# Patient Record
Sex: Male | Born: 1935
Health system: Southern US, Community
[De-identification: ages and names within clinical notes are randomized; demographics above are authoritative.]

## PROBLEM LIST (undated history)

## (undated) DIAGNOSIS — R0600 Dyspnea, unspecified: Secondary | ICD-10-CM

## (undated) DIAGNOSIS — Z87442 Personal history of urinary calculi: Secondary | ICD-10-CM

## (undated) DIAGNOSIS — I214 Non-ST elevation (NSTEMI) myocardial infarction: Secondary | ICD-10-CM

## (undated) DIAGNOSIS — H9190 Unspecified hearing loss, unspecified ear: Secondary | ICD-10-CM

## (undated) DIAGNOSIS — M1990 Unspecified osteoarthritis, unspecified site: Secondary | ICD-10-CM

## (undated) DIAGNOSIS — I219 Acute myocardial infarction, unspecified: Secondary | ICD-10-CM

## (undated) DIAGNOSIS — I2510 Atherosclerotic heart disease of native coronary artery without angina pectoris: Secondary | ICD-10-CM

## (undated) DIAGNOSIS — K219 Gastro-esophageal reflux disease without esophagitis: Secondary | ICD-10-CM

## (undated) DIAGNOSIS — C3490 Malignant neoplasm of unspecified part of unspecified bronchus or lung: Secondary | ICD-10-CM

## (undated) DIAGNOSIS — I10 Essential (primary) hypertension: Secondary | ICD-10-CM

## (undated) DIAGNOSIS — J449 Chronic obstructive pulmonary disease, unspecified: Secondary | ICD-10-CM

## (undated) DIAGNOSIS — N200 Calculus of kidney: Secondary | ICD-10-CM

## (undated) DIAGNOSIS — E785 Hyperlipidemia, unspecified: Secondary | ICD-10-CM

## (undated) DIAGNOSIS — I255 Ischemic cardiomyopathy: Secondary | ICD-10-CM

## (undated) HISTORY — PX: HERNIA REPAIR: SHX51

## (undated) HISTORY — PX: TONSILLECTOMY: SHX5217

## (undated) HISTORY — DX: Chronic obstructive pulmonary disease, unspecified: J44.9

## (undated) HISTORY — DX: Malignant neoplasm of unspecified part of unspecified bronchus or lung: C34.90

## (undated) HISTORY — PX: ANGIOPLASTY: SHX39

## (undated) HISTORY — PX: COLONOSCOPY: SHX174

## (undated) HISTORY — DX: Essential (primary) hypertension: I10

## (undated) HISTORY — DX: Unspecified osteoarthritis, unspecified site: M19.90

## (undated) HISTORY — DX: Atherosclerotic heart disease of native coronary artery without angina pectoris: I25.10

## (undated) HISTORY — DX: Gastro-esophageal reflux disease without esophagitis: K21.9

## (undated) HISTORY — PX: LIPOMA EXCISION: SHX5283

## (undated) HISTORY — DX: Non-ST elevation (NSTEMI) myocardial infarction: I21.4

## (undated) HISTORY — DX: Acute myocardial infarction, unspecified: I21.9

## (undated) HISTORY — DX: Hyperlipidemia, unspecified: E78.5

---
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
## 2006-03-08 ENCOUNTER — Inpatient Hospital Stay (HOSPITAL_COMMUNITY): Admission: EM | Admit: 2006-03-08 | Discharge: 2006-03-10 | Payer: Self-pay | Admitting: Internal Medicine

## 2006-03-08 ENCOUNTER — Encounter: Payer: Self-pay | Admitting: Emergency Medicine

## 2006-03-08 ENCOUNTER — Ambulatory Visit: Payer: Self-pay | Admitting: Internal Medicine

## 2006-03-21 ENCOUNTER — Ambulatory Visit: Payer: Self-pay | Admitting: Cardiovascular Disease

## 2006-04-14 ENCOUNTER — Encounter (HOSPITAL_COMMUNITY): Admission: RE | Admit: 2006-04-14 | Discharge: 2006-05-02 | Payer: Self-pay | Admitting: Cardiovascular Disease

## 2006-05-04 ENCOUNTER — Encounter (HOSPITAL_COMMUNITY): Admission: RE | Admit: 2006-05-04 | Discharge: 2006-06-03 | Payer: Self-pay | Admitting: Cardiovascular Disease

## 2006-06-13 ENCOUNTER — Ambulatory Visit: Payer: Self-pay | Admitting: Cardiovascular Disease

## 2006-06-29 ENCOUNTER — Ambulatory Visit: Payer: Self-pay | Admitting: Cardiovascular Disease

## 2006-07-06 ENCOUNTER — Encounter (HOSPITAL_COMMUNITY): Admission: RE | Admit: 2006-07-06 | Discharge: 2006-08-05 | Payer: Self-pay | Admitting: Cardiovascular Disease

## 2006-08-02 ENCOUNTER — Ambulatory Visit: Payer: Self-pay | Admitting: Cardiovascular Disease

## 2007-05-02 ENCOUNTER — Ambulatory Visit: Payer: Self-pay | Admitting: Cardiovascular Disease

## 2007-05-05 ENCOUNTER — Ambulatory Visit (HOSPITAL_COMMUNITY): Admission: RE | Admit: 2007-05-05 | Discharge: 2007-05-05 | Payer: Self-pay | Admitting: Cardiovascular Disease

## 2007-06-28 ENCOUNTER — Ambulatory Visit: Payer: Self-pay | Admitting: Cardiovascular Disease

## 2008-01-15 ENCOUNTER — Ambulatory Visit: Payer: Self-pay | Admitting: Cardiology

## 2008-02-24 ENCOUNTER — Ambulatory Visit: Payer: Self-pay | Admitting: *Deleted

## 2008-02-25 ENCOUNTER — Inpatient Hospital Stay (HOSPITAL_COMMUNITY): Admission: RE | Admit: 2008-02-25 | Discharge: 2008-02-27 | Payer: Self-pay | Admitting: Cardiology

## 2008-03-11 ENCOUNTER — Ambulatory Visit: Payer: Self-pay | Admitting: Cardiology

## 2008-07-26 ENCOUNTER — Ambulatory Visit: Payer: Self-pay | Admitting: Cardiology

## 2008-10-23 ENCOUNTER — Encounter (INDEPENDENT_AMBULATORY_CARE_PROVIDER_SITE_OTHER): Payer: Self-pay | Admitting: *Deleted

## 2008-12-10 ENCOUNTER — Telehealth (INDEPENDENT_AMBULATORY_CARE_PROVIDER_SITE_OTHER): Payer: Self-pay

## 2008-12-17 ENCOUNTER — Telehealth: Payer: Self-pay | Admitting: Cardiology

## 2008-12-18 ENCOUNTER — Telehealth (INDEPENDENT_AMBULATORY_CARE_PROVIDER_SITE_OTHER): Payer: Self-pay

## 2008-12-27 ENCOUNTER — Telehealth (INDEPENDENT_AMBULATORY_CARE_PROVIDER_SITE_OTHER): Payer: Self-pay | Admitting: *Deleted

## 2009-01-14 DIAGNOSIS — E782 Mixed hyperlipidemia: Secondary | ICD-10-CM

## 2009-01-14 DIAGNOSIS — J449 Chronic obstructive pulmonary disease, unspecified: Secondary | ICD-10-CM

## 2009-01-15 ENCOUNTER — Ambulatory Visit: Payer: Self-pay | Admitting: Cardiology

## 2009-01-15 DIAGNOSIS — I2510 Atherosclerotic heart disease of native coronary artery without angina pectoris: Secondary | ICD-10-CM

## 2009-01-15 DIAGNOSIS — I10 Essential (primary) hypertension: Secondary | ICD-10-CM | POA: Insufficient documentation

## 2009-01-21 ENCOUNTER — Encounter: Payer: Self-pay | Admitting: Cardiovascular Disease

## 2009-01-29 ENCOUNTER — Encounter: Payer: Self-pay | Admitting: Cardiology

## 2009-02-02 LAB — CONVERTED CEMR LAB
BUN: 22 mg/dL (ref 6–23)
Bilirubin, Direct: 0.1 mg/dL (ref 0.0–0.3)
CO2: 25 meq/L (ref 19–32)
Glucose, Bld: 92 mg/dL (ref 70–99)
LDL Cholesterol: 71 mg/dL (ref 0–99)
Potassium: 4.6 meq/L (ref 3.5–5.3)
Sodium: 144 meq/L (ref 135–145)
Total Bilirubin: 0.5 mg/dL (ref 0.3–1.2)
Total CHOL/HDL Ratio: 2.5
VLDL: 15 mg/dL (ref 0–40)

## 2009-02-03 ENCOUNTER — Encounter (INDEPENDENT_AMBULATORY_CARE_PROVIDER_SITE_OTHER): Payer: Self-pay | Admitting: *Deleted

## 2009-03-07 ENCOUNTER — Emergency Department (HOSPITAL_COMMUNITY): Admission: EM | Admit: 2009-03-07 | Discharge: 2009-03-07 | Payer: Self-pay | Admitting: Emergency Medicine

## 2009-03-09 ENCOUNTER — Emergency Department (HOSPITAL_COMMUNITY): Admission: EM | Admit: 2009-03-09 | Discharge: 2009-03-09 | Payer: Self-pay | Admitting: Emergency Medicine

## 2009-03-24 ENCOUNTER — Encounter: Payer: Self-pay | Admitting: Orthopedic Surgery

## 2009-03-26 ENCOUNTER — Telehealth (INDEPENDENT_AMBULATORY_CARE_PROVIDER_SITE_OTHER): Payer: Self-pay | Admitting: *Deleted

## 2009-03-29 ENCOUNTER — Emergency Department (HOSPITAL_COMMUNITY): Admission: EM | Admit: 2009-03-29 | Discharge: 2009-03-29 | Payer: Self-pay | Admitting: Emergency Medicine

## 2009-03-29 ENCOUNTER — Encounter: Payer: Self-pay | Admitting: Orthopedic Surgery

## 2009-03-31 ENCOUNTER — Ambulatory Visit: Payer: Self-pay | Admitting: Orthopedic Surgery

## 2009-04-03 ENCOUNTER — Telehealth (INDEPENDENT_AMBULATORY_CARE_PROVIDER_SITE_OTHER): Payer: Self-pay | Admitting: *Deleted

## 2009-04-07 ENCOUNTER — Ambulatory Visit: Payer: Self-pay | Admitting: Orthopedic Surgery

## 2009-04-14 ENCOUNTER — Ambulatory Visit: Payer: Self-pay | Admitting: Orthopedic Surgery

## 2009-05-14 ENCOUNTER — Ambulatory Visit: Payer: Self-pay | Admitting: Orthopedic Surgery

## 2009-05-22 ENCOUNTER — Ambulatory Visit: Payer: Self-pay | Admitting: Orthopedic Surgery

## 2009-06-12 ENCOUNTER — Ambulatory Visit: Payer: Self-pay | Admitting: Orthopedic Surgery

## 2009-07-24 ENCOUNTER — Ambulatory Visit: Payer: Self-pay | Admitting: Cardiology

## 2009-07-25 ENCOUNTER — Encounter: Payer: Self-pay | Admitting: Cardiology

## 2009-07-30 LAB — CONVERTED CEMR LAB
ALT: 11 units/L (ref 0–53)
Albumin: 4.3 g/dL (ref 3.5–5.2)
Cholesterol: 162 mg/dL (ref 0–200)
HDL: 55 mg/dL (ref >39)
Indirect Bilirubin: 0.2 mg/dL (ref 0.0–0.9)
Total CHOL/HDL Ratio: 2.9
Total Protein: 6.7 g/dL (ref 6.0–8.3)
Triglycerides: 88 mg/dL (ref <150)
VLDL: 18 mg/dL (ref 0–40)

## 2009-07-31 ENCOUNTER — Encounter: Payer: Self-pay | Admitting: Cardiology

## 2009-09-02 ENCOUNTER — Telehealth (INDEPENDENT_AMBULATORY_CARE_PROVIDER_SITE_OTHER): Payer: Self-pay

## 2010-01-22 ENCOUNTER — Ambulatory Visit: Payer: Self-pay | Admitting: Cardiology

## 2010-01-22 DIAGNOSIS — F17210 Nicotine dependence, cigarettes, uncomplicated: Secondary | ICD-10-CM

## 2010-02-03 ENCOUNTER — Encounter (HOSPITAL_COMMUNITY): Admission: RE | Admit: 2010-02-03 | Discharge: 2010-02-03 | Payer: Self-pay | Admitting: Cardiology

## 2010-02-03 ENCOUNTER — Ambulatory Visit: Payer: Self-pay | Admitting: Cardiology

## 2010-02-04 ENCOUNTER — Encounter: Payer: Self-pay | Admitting: Cardiology

## 2010-02-04 LAB — CONVERTED CEMR LAB
Alkaline Phosphatase: 48 units/L (ref 39–117)
Cholesterol: 142 mg/dL (ref 0–200)
Indirect Bilirubin: 0.3 mg/dL (ref 0.0–0.9)
LDL Cholesterol: 75 mg/dL (ref 0–99)
Total Protein: 6.6 g/dL (ref 6.0–8.3)
Triglycerides: 81 mg/dL (ref <150)

## 2010-03-17 ENCOUNTER — Emergency Department (HOSPITAL_COMMUNITY): Admission: EM | Admit: 2010-03-17 | Discharge: 2010-03-17 | Payer: Self-pay | Admitting: Internal Medicine

## 2010-05-08 ENCOUNTER — Telehealth (INDEPENDENT_AMBULATORY_CARE_PROVIDER_SITE_OTHER): Payer: Self-pay

## 2010-05-11 ENCOUNTER — Telehealth (INDEPENDENT_AMBULATORY_CARE_PROVIDER_SITE_OTHER): Payer: Self-pay

## 2010-05-11 ENCOUNTER — Encounter: Payer: Self-pay | Admitting: Cardiology

## 2010-05-24 ENCOUNTER — Encounter: Payer: Self-pay | Admitting: Cardiovascular Disease

## 2010-06-02 NOTE — Letter (Signed)
Summary: [place] Results Engineer, agricultural at [facility]  [address], [place] [address]   Phone: [phone]  Fax: [phone]      July 31, 2009 MRN: [id number]   [name] Graham [address] [place], [place]  [address]   Dear Mr. BOGUE,  Your test ordered by Selena Batten has been reviewed by your physician (or physician assistant) and was found to be normal or stable. Your physician (or physician assistant) felt no changes were needed at this time.  ____ Echocardiogram  ____ Cardiac Stress Test  __X__ Lab Work  ____ Peripheral vascular study of arms, legs or neck  ____ CT scan or X-ray  ____ Lung or Breathing test  ____ Other: Please continue on current medical treatment.   Thank you.   Nona Dell, MD, F.A.C.C

## 2010-06-02 NOTE — Letter (Signed)
Summary: [place] Results Engineer, agricultural at [facility]  [address], [place] [address]   Phone: [phone]  Fax: [phone]      February 04, 2010 MRN: [id number]   CEPHUS TUPY [address] [place], [place]  [address]   Dear Mr. LANGENBACH,  Your test ordered by Selena Batten has been reviewed by your physician (or physician assistant) and was found to be normal or stable. Your physician (or physician assistant) felt no changes were needed at this time.  ____ Echocardiogram  __X__ Cardiac Stress Test  __X__ Lab Work  ____ Peripheral vascular study of arms, legs or neck  ____ CT scan or X-ray  ____ Lung or Breathing test  ____ Other:  Please continue on current medical treatment.   Thank you.   Nona Dell, MD, F.A.C.C

## 2010-06-02 NOTE — Letter (Signed)
Summary: [place] (Nuc Med Stress)  [facility]  [address], [place] [address]   Phone: [phone]  Fax: [phone]    Nuclear Medicine 1-Day Stress Test Information Sheet  Re:     [name] Graham   DOB:     03-30-1936 MRN:     [id number] Weight:  Appointment Date: Register at: Appointment Time: Referring MD:  ___Exercise Stress  __Adenosine   __Dobutamine  _X_Lexiscan  __Persantine   __Thallium  Urgency: ____1 (next day)   ____2 (one week)    ____3 (PRN)  Patient will receive Follow Up call with results: Patient needs follow-up appointment:  Instructions regarding medication:  How to prepare for your stress test: 1. DO NOT eat or dring 6 hours prior to your arrival time. This includes no caffeine (coffee, tea, sodas, chocolate) if you were instructed to take your medications, drink water with it. 2. DO NOT use any tobacco products for at leaset 8 hours prior to arrival. 3. DO NOT wear dresses or any clothing that may have metal clasps or buttons. 4. Wear short sleeve shirts, loose clothing, and comfortalbe walking shoes. 5. DO NOT use lotions, oils or powder on your chest before the test. 6. The test will take approximately 3-4 hours from the time you arrive until completion. 7. To register the day of the test, go to the Short Stay entrance at [facility]. 8. If you must cancel your test, call [phone] as soon as you are aware. 9. Please take your medicines as you normally do on morning of test.  After you arrive for test:   When you arrive at [facility], you will go to Short Stay to be registered. They will then send you to Radiology to check in. The Nuclear Medicine Tech will get you and start an IV in your arm or hand. A small amount of a radioactive tracer will then be injected into your IV. This tracer will then have to circulate for 30-45 minutes. During this time you will wait in the waiting room and you will be able  to drink something without caffeine. A series of pictures will be taken of your heart follwoing this waiting period. After the 1st set of pictures you will go to the stress lab to get ready for your stress test. During the stress test, another small amount of a radioactive tracer will be injected through your IV. When the stress test is complete, there is a short rest period while your heart rate and blood pressure will be monitored. When this monitoring period is complete you will have another set of pictrues taken. (The same as the 1st set of pictures). These pictures are taken between 15 minutes and 1 hour after the stress test. The time depends on the type of stress test you had. Your doctor will inform you of your test results within 7 days after test.    The possibilities of certain changes are possible during the test. They include abnormal blood pressure and disorders of the heart. Side effects of persantine or adenosine can include flushing, chest pain, shortness of breath, stomach tightness, headache and light-headedness. These side effects usually do not last long and are self-resolving. Every effort will be made to keep you comfortable and to minimize complications by obtaining a medical history and by close observation during the test. Emergency equipment, medications, and trained personnel are available to deal with any unusual situation which may arise.  Please notify  office at least 48 hours in advance if you are unable to keep this appt.

## 2010-06-02 NOTE — Assessment & Plan Note (Signed)
Summary: FINGER SWOLLEN/MEDICARE/BSF   Visit Type:  Follow-up Primary Provider:  Dr. Rudi Heap  CC:  left index finger.  History of Present Illness: I saw [name] Graham in the office today for a followup visit.  He is a 75 years old man with the complaint of:  LEFT INDEX FINGER.  Patient states that Monday night his finger started swelling and stiffness. He states that he had cut wood all day and he thinks he  overworked it.  The finger looks good now   no tenderness, minimal amount of swelling   I think its synovitis non infectious   Use ice if it doesnt go down or he loses motion then return     Allergies: No Known Drug Allergies   Impression & Recommendations:  Problem # 1:  OTHER TENOSYNOVITIS OF HAND AND WRIST (ICD-727.05) Assessment Comment Only  Orders: Est. Patient Level II ([address])  Patient Instructions: 1)  Please schedule a follow-up appointment as needed.

## 2010-06-02 NOTE — Assessment & Plan Note (Signed)
Summary: B1Y   Visit Type:  Follow-up Primary Neena Beecham:  Dr. Rudi Heap   History of Present Illness: 75 year old male presents for followup. I saw him back in March of this year. He reports doing well without significant angina or unusual shortness of breath. 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  He is nearly 2 years out from his most recent percutaneous intervention with DES to the RCA. We discussed followup ischemic testing.  Followup labs from March showed cholesterol 162, triglycerides 88, HDL 55, LDL 89, AST 15, ALT 11. He has not had recent reassessment of lipids.  Reports tolerating medications. He states that he did not have to undergo any dental work, and has had uninterrupted dual antiplatelet therapy. No reported bleeding problems.  Current Medications (verified): 1)  Metoprolol Tartrate 25 Mg Tabs (Metoprolol Tartrate) .... Take 1 Tab Daily 2)  Lisinopril 40 Mg Tabs (Lisinopril) .... Take 1 Tablet By Mouth Once Daily 3)  Nitroglycerin 0.4 Mg Subl (Nitroglycerin) .... Place 1 Tablet Under Tongue As Directed 4)  Simvastatin 80 Mg Tabs (Simvastatin) .... Take 1 Tablet By Mouth Every Night 5)  Aspir-Low 81 Mg Tbec (Aspirin) .... Take 1 Tab Daily 6)  Plavix 75 Mg Tabs (Clopidogrel Bisulfate) .... Take 1 Tab Daily  Allergies (verified): No Known Drug Allergies  Past History:  Past Medical History: Last updated: 01/15/2009 CAD - BMS circ and PTCA PDA 2007, DES RCA 10/09 C O P D G E R D Hyperlipidemia Hypertension Myocardial Infarction  Social History: Last updated: 01/14/2009 Retired  Divorced  Tobacco Use - Yes.  Alcohol Use - no Regular Exercise - no Drug Use - no   Review of Systems  The patient denies anorexia, fever, chest pain, syncope, dyspnea on exertion, peripheral edema, melena, and hematochezia.         Otherwise reviewed and negative except as outlined.  Vital Signs:  Patient profile:   75 year old male Weight:       164 pounds BMI:     23.62 Pulse rate:   63 / minute BP sitting:   147 / 73  (right arm)  Vitals Entered By: Dreama Saa, CNA (January 22, 2010 2:27 PM)  Physical Exam  Additional Exam:  Normally nourished appearing male in no acute distress. HEENT: Conjunctiva and lids normal, oropharynx with moist mucosa, poor dentition. Neck: Supple, no carotid bruits or elevated JVP. Lungs: Clear with diminished breath sounds, nonlabored. Cardiac: Regular rate and rhythm, no significant systolic murmur or S3. Abdomen: Soft, nontender, bowel sounds present. Skin: Warm and dry. Extremities: No pitting edema, distal pulses 2+. Musculoskeletal: No kyphosis. Neuropsychiatric: Alert and oriented x3, affect appropriate.   Cardiac Cath  Procedure date:  02/26/2008  Findings:      Left mainstem:  Widely patent.  Bifurcates into LAD and left circumflex.      LAD is a moderate-sized vessel and courses down and reaches the LV apex.   There is a hazy stenosis in the proximal vessel of the origin in the   first diagonal.  The first diagonal is widely patent.  Intravascular   ultrasound was performed with findings as detailed above.  There was   nonobstructive disease with a minimal lumen area just over 4 square   millimeters.  There was mild concentric calcified plaque.  Remaining   portion of the vessel had no significant angiographic stenosis.      Left circumflex.  Left circumflex was a large vessel.  There was a   widely patent stent in the proximal vessel.  There was no restenosis   present.  The distal vessel had mild diffuse disease that appeared   nonobstructive and unchanged from a prior catheterization in 2007.      Right coronary artery is tortuous.  It is a large vessel that courses   down and supplies a PDA branch and posterolateral branch.  There were   tandem mid lesions of 80% at the junction of the proximal and mid vessel   and then at the junction of the mid to distal vessel.   The distal right   coronary artery bifurcation that was previously treated with balloon   angioplasty appears widely patent with no significant stenosis.  The PDA   and posterolateral branches are small to moderate-sized vessels.  There   was TIMI III flow in the vessel.      Left ventriculography demonstrated normal LV function with an LVEF of   55%.  There is no significant mitral regurgitation.   EKG  Procedure date:  01/22/2010  Findings:      Sinus rhythm at 60 beats per minute with decreased septal R waves.  Impression & Recommendations:  Problem # 1:  CORONARY ATHEROSCLEROSIS NATIVE CORONARY ARTERY (ICD-414.01)  At this point symptomatically stable on medical therapy. He is just at 2 years out from prior intervention with DES to the RCA. Plan followup Lexiscan Myoview on medical therapy for ischemic surveillance. Otherwise clinical visit in 6 months.  His updated medication list for this problem includes:    Metoprolol Tartrate 25 Mg Tabs (Metoprolol tartrate) .Marland Kitchen... Take 1 tab daily    Lisinopril 40 Mg Tabs (Lisinopril) .Marland Kitchen... Take 1 tablet by mouth once daily    Nitroglycerin 0.4 Mg Subl (Nitroglycerin) .Marland Kitchen... Place 1 tablet under tongue as directed    Aspir-low 81 Mg Tbec (Aspirin) .Marland Kitchen... Take 1 tab daily    Plavix 75 Mg Tabs (Clopidogrel bisulfate) .Marland Kitchen... Take 1 tab daily  His updated medication list for this problem includes:    Metoprolol Tartrate 25 Mg Tabs (Metoprolol tartrate) .Marland Kitchen... Take 1 tab daily    Lisinopril 40 Mg Tabs (Lisinopril) .Marland Kitchen... Take 1 tablet by mouth once daily    Nitroglycerin 0.4 Mg Subl (Nitroglycerin) .Marland Kitchen... Place 1 tablet under tongue as directed    Aspir-low 81 Mg Tbec (Aspirin) .Marland Kitchen... Take 1 tab daily    Plavix 75 Mg Tabs (Clopidogrel bisulfate) .Marland Kitchen... Take 1 tab daily  Problem # 2:  HYPERLIPIDEMIA (ICD-272.4)  Due for followup fasting lipid profile and liver function tests. He has tolerated high-dose simvastatin for some time now.  His updated  medication list for this problem includes:    Simvastatin 80 Mg Tabs (Simvastatin) .Marland Kitchen... Take 1 tablet by mouth every night  His updated medication list for this problem includes:    Simvastatin 80 Mg Tabs (Simvastatin) .Marland Kitchen... Take 1 tablet by mouth every night  Orders: T-Lipid Profile ([address]) T-Hepatic Function [phone])  Problem # 3:  ESSENTIAL HYPERTENSION, BENIGN (ICD-401.1)  Blood pressure up some today. We discussed sodium restriction and exercise.  His updated medication list for this problem includes:    Metoprolol Tartrate 25 Mg Tabs (Metoprolol tartrate) .Marland Kitchen... Take 1 tab daily    Lisinopril 40 Mg Tabs (Lisinopril) .Marland Kitchen... Take 1 tablet by mouth once daily    Aspir-low 81 Mg Tbec (Aspirin) .Marland Kitchen... Take 1 tab daily  His updated medication list for this problem includes:    Metoprolol Tartrate 25  Mg Tabs (Metoprolol tartrate) .Marland Kitchen... Take 1 tab daily    Lisinopril 40 Mg Tabs (Lisinopril) .Marland Kitchen... Take 1 tablet by mouth once daily    Aspir-low 81 Mg Tbec (Aspirin) .Marland Kitchen... Take 1 tab daily  Problem # 4:  TOBACCO ABUSE (ICD-305.1)  Smoking cessation has been discussed. He has not been able to quit.  Other Orders: Nuclear Stress Test (Nuc Stress Test)  Patient Instructions: 1)  Your physician recommends that you schedule a follow-up appointment in:  6 months 2)  Your physician recommends that you return for lab work in: This week 3)  Your physician recommends that you continue on your current medications as directed. Please refer to the Current Medication list given to you today. 4)  Your physician has requested that you have an [employer]  For further information please visit [email or web address]  Please follow instruction sheet, as given.

## 2010-06-02 NOTE — Assessment & Plan Note (Signed)
Summary: 1 M RE-CK FINGER LT HAND/CIGNA MEDICARE/CAF   Visit Type:  Follow-up Primary Provider:  Dr. Rudi Heap  CC:  swelling LEFT index finger.  History of Present Illness: I saw [name] Graham in the office today for a followup visit.  He is a 75 years old man with the complaint of:  swelling of the LEFT index finger.  History the patient a puncture wound near the proximal phalanx of the LEFT index finger onDOI: 03/08/09 this was treated with clindamycin, and observation.  Complaints: his finger was alomost back to normal for a couple weeks and then 05/11/09 woke up and finger was swollen again, with difficult ROM.  Has alot of soreness joint of the left hand near palm.  Has a piece of steel in the finger for over 10 years    Review of systems no saline is no streaking, lymphangitis. No swelling of the lymph nodes.  Examination of the LEFT index finger shows that the entire finger was swollen, but is tender from the proximal phalanx IP joint2. The A1 pulley area of the palm. His decreased flexion. He is painful extension. The thenar eminence is nontender. No swelling. Joint. Range of motion is normal.  Assessment I think he has a trigger finger with flexor tenosynovitis.  Recommend injection.  Return one week    Current Medications (verified): 1)  Metoprolol Tartrate 25 Mg Tabs (Metoprolol Tartrate) .... Take 1 Tab Daily 2)  Lisinopril 40 Mg Tabs (Lisinopril) .... Take 1 Tablet By Mouth Once Daily 3)  Nitroglycerin 0.4 Mg Subl (Nitroglycerin) .... Place 1 Tablet Under Tongue As Directed 4)  Simvastatin 80 Mg Tabs (Simvastatin) .... Take 1 Tablet By Mouth Every Night 5)  Aspir-Low 81 Mg Tbec (Aspirin) .... Take 1 Tab Daily 6)  Plavix 75 Mg Tabs (Clopidogrel Bisulfate) .... Take 1 Tab Daily  Allergies (verified): No Known Drug Allergies   Impression & Recommendations:  Problem # 1:  OTHER TENOSYNOVITIS OF HAND AND WRIST (ICD-727.05) Assessment  Deteriorated  Orders: Est. Patient Level III ([address]) Depo- Medrol 40mg  (J1030) Joint Aspirate / Injection, Small ([address])  Verbal consent was obtained: The finger was prepped with ethyl chloride and injected with 1:1 injection of .25% sensorcaine, 1cc  and 40 mg of depomedrol, 1cc. There were no complications.  Patient Instructions: 1)  Please schedule a follow-up appointment in 1 week. 2)  You have received an injection of cortisone today. You may experience increased pain at the injection site. Apply ice pack to the area for 20 minutes every 2 hours and take 2 xtra strength tylenol every 8 hours. This increased pain will usually resolve in 24 hours. The injection will take effect in 3-10 days.

## 2010-06-02 NOTE — Assessment & Plan Note (Signed)
Summary: 1 WK RE-CK FINGER LT HAND/CIGNA MEDICARE/CAF   Visit Type:  Follow-up Primary Provider:  Dr. Rudi Heap  CC:  LEFT index finger swelling stiffness.  History of Present Illness: I saw [name] Graham in the office today for a followup visit.  He is a 75 years old man with the complaint of:  DX: swelling left index finger.  Puncture wound 03/08/09.  Treatment:  ATBS, soaks.  MEDS:  None.  Complaints:  the injection helped, he can make a fist, he still has stiffness in the am.  Today, scheduled for: one week recheck on the left index finger after injection.  His finger looks good he can make a full fist he has no tenderness or swelling.  Impression improved after injection for triggering.  Plan return as needed    Allergies: No Known Drug Allergies   Impression & Recommendations:  Problem # 1:  OTHER TENOSYNOVITIS OF HAND AND WRIST (ICD-727.05) Assessment Improved  Orders: Est. Patient Level II ([address])  Patient Instructions: 1)  come back as needed

## 2010-06-02 NOTE — Progress Notes (Signed)
Summary: RX REFILL PT HAS BEEN OUT FOR 7 DAYS  Phone Note Call from Patient Call back at Home Phone [phone]   Caller: PT Reason for Call: Refill Medication Summary of Call: S: PT NEEDS LISINOPRIL 20MG  #30 [facility]ED INTO [facility] [place], HE SAID HE CALLED IT INTO PHARMACY AND THEY TOLD HIM THEY ARE WAITING FOR AUTHERIZATION FROM [place]. Initial call taken by: Faythe Ghee,  Sep 02, 2009 12:27 PM    Prescriptions: LISINOPRIL 40 MG TABS (LISINOPRIL) take 1 tablet by mouth once daily  #30 x 4   Entered by:   Larita Fife Via LPN   Authorized by:   Loreli Slot, MD, [facility]   Signed by:   Larita Fife Via LPN on 28/41/3244   Method used:   [facility]ronically to        [facility] [phone]* (retail)       [address]       [place], [place]  [address]       Ph: [phone] or [phone]       Fax: [phone]   RxID:   [id number]

## 2010-06-02 NOTE — Assessment & Plan Note (Signed)
Summary: 6 MTH F/U PER CHECKOUT ON 01/15/09/TG   Visit Type:  Follow-up Primary Provider:  Dr. Rudi Heap   History of Present Illness: 75 year old male presents for a followup visit. He continues to work in his [employer], staying busy this time of year. He is reporting no significant angina or limiting breathlessness, and is tolerating his medicines well. He has had no bleeding problems on aspirin and Plavix.  Followup labs from September 2010 revealed sodium 144, potassium 4.6, BUN 22, creatinine 0.8, total cholesterol 143, [address] 73, HDL 57, LDL 71, AST 15, ALT 12.  He is due for a followup lipid profile and liver function tests.  He mentioned that he is in need of having some teeth extracted. He is at a point now, greater than one year out from drug-eluting stent placement, and could consider temporarily discontinuing Plavix if needed. I discussed this him today.   Current Medications (verified): 1)  Metoprolol Tartrate 25 Mg Tabs (Metoprolol Tartrate) .... Take 1 Tab Daily 2)  Lisinopril 40 Mg Tabs (Lisinopril) .... Take 1 Tablet By Mouth Once Daily 3)  Nitroglycerin 0.4 Mg Subl (Nitroglycerin) .... Place 1 Tablet Under Tongue As Directed 4)  Simvastatin 80 Mg Tabs (Simvastatin) .... Take 1 Tablet By Mouth Every Night 5)  Aspir-Low 81 Mg Tbec (Aspirin) .... Take 1 Tab Daily 6)  Plavix 75 Mg Tabs (Clopidogrel Bisulfate) .... Take 1 Tab Daily  Allergies (verified): No Known Drug Allergies  Past History:  Past Medical History: Last updated: 01/15/2009 CAD - BMS circ and PTCA PDA 2007, DES RCA 10/09 C O P D G E R D Hyperlipidemia Hypertension Myocardial Infarction  Social History: Last updated: 01/14/2009 Retired  Divorced  Tobacco Use - Yes.  Alcohol Use - no Regular Exercise - no Drug Use - no  Review of Systems  The patient denies anorexia, fever, weight loss, chest pain, syncope, dyspnea on exertion, peripheral edema, hemoptysis, melena,  hematochezia, and severe indigestion/heartburn.         He reports having an infection in his left hand after injuring it, now healed. Otherwise reviewed and negative except as outlined above.  Vital Signs:  Patient profile:   75 year old male Weight:      160 pounds Pulse rate:   60 / minute BP sitting:   139 / 85  (right arm)  Vitals Entered By: Dreama Saa, CNA (July 24, 2009 1:37 PM)  Physical Exam  Additional Exam:  Normally nourished appearing male in no acute distress. HEENT: Conjunctiva and lids normal, oropharynx with moist mucosa, poor dentition. Neck: Supple, no carotid bruits or elevated JVP. Lungs: Clear with diminished breath sounds, nonlabored. Cardiac: Regular rate and rhythm, no significant systolic murmur or S3. Abdomen: Soft, nontender, bowel sounds present. Skin: Warm and dry. Extremities: No pitting edema, distal pulses 2+. Musculoskeletal: No kyphosis. Neuropsychiatric: Alert and oriented x3, affect appropriate.   EKG  Procedure date:  07/24/2009  Findings:      Sinus bradycardia at 58 beats per minute.  Impression & Recommendations:  Problem # 1:  CORONARY ATHEROSCLEROSIS NATIVE CORONARY ARTERY (ICD-414.01)  Clinically stable on medical therapy with no active angina or unusual breathlessness. Electrocardiogram is also stable. No medication changes were made today. I will see him back in 6 months.  His updated medication list for this problem includes:    Metoprolol Tartrate 25 Mg Tabs (Metoprolol tartrate) .Marland Kitchen... Take 1 tab daily    Lisinopril 40 Mg Tabs (Lisinopril) .Marland Kitchen... Take 1  tablet by mouth once daily    Nitroglycerin 0.4 Mg Subl (Nitroglycerin) .Marland Kitchen... Place 1 tablet under tongue as directed    Aspir-low 81 Mg Tbec (Aspirin) .Marland Kitchen... Take 1 tab daily    Plavix 75 Mg Tabs (Clopidogrel bisulfate) .Marland Kitchen... Take 1 tab daily  Problem # 2:  ESSENTIAL HYPERTENSION, BENIGN (ICD-401.1)  Blood pressure coming under better control. He is tolerating the  increase in lisinopril, and had stable renal function.  His updated medication list for this problem includes:    Metoprolol Tartrate 25 Mg Tabs (Metoprolol tartrate) .Marland Kitchen... Take 1 tab daily    Lisinopril 40 Mg Tabs (Lisinopril) .Marland Kitchen... Take 1 tablet by mouth once daily    Aspir-low 81 Mg Tbec (Aspirin) .Marland Kitchen... Take 1 tab daily  Problem # 3:  HYPERLIPIDEMIA (ICD-272.4)  Due for followup fasting lipid profile and liver function tests.  His updated medication list for this problem includes:    Simvastatin 80 Mg Tabs (Simvastatin) .Marland Kitchen... Take 1 tablet by mouth every night  Future Orders: T-Lipid Profile ([id number]) ... 07/25/2009 T-Hepatic Function [phone]) ... 07/25/2009  Patient Instructions: 1)  Your physician recommends that you schedule a follow-up appointment in: 6 months 2)  Your physician recommends that you return for lab work in: Ths week 3)  Your physician recommends that you continue on your current medications as directed. Please refer to the Current Medication list given to you today.

## 2010-06-04 NOTE — Progress Notes (Signed)
**Note De-Identified [name] Graham Obfuscation** Summary: Letter to okay dental procedure   Phone Note Call from Patient   Caller: Patient Reason for Call: Talk to Nurse Summary of Call: patient states that he needs letter stating that it is okay to have oral procedure / please call when ready /tg Initial call taken by: Raechel Ache [facility],  May 11, 2010 8:16 AM  Follow-up for Phone Call        Clearence letter faxed to [facility] at [phone] for planned dental procedures. Follow-up by: Larita Fife Leylani Duley LPN,  May 11, 2010 9:53 AM

## 2010-06-04 NOTE — Progress Notes (Signed)
**Note De-Identified Isrrael Fluckiger Obfuscation** Summary: Medication Questions  Phone Note Call from Patient   Caller: Patient Reason for Call: Talk to Nurse Summary of Call: patient has questions regarding stopping medications prior to dental work / tg Initial call taken by: Raechel Ache [facility],  May 08, 2010 8:50 AM  Follow-up for Phone Call        S: Pt. is having dental work done.  B: On 07-24-09 appt. with Dr. Diona Browner pt. states he was advised to stop taking Plavix and ASA 5 days prior to dental work. Pt. was later told he did not have to have any dental work at that time. A: Pt. states that he is having dental work done soon and wants to know when he should stop taking Plavix and ASA.  R: We will return call with Dr. Ival Bible recommendations. Follow-up by: Larita Fife Clotilde Loth LPN,  May 08, 2010 1:32 PM  Additional Follow-up for Phone Call Additional follow up Details #1::        I would ask that he check with his dentist to see if they have a typical duration for interrupting antiplatelet therapy with which they are comfortable prior to oral procedures. Generally this ranges from 5-7 days, and either should be generally OK from a cardiac perspective. He is greater than 2 years out from DES. Should resume both after oral procedure when safe. Additional Follow-up by: Loreli Slot, MD, [facility],  May 08, 2010 2:04 PM    Additional Follow-up for Phone Call Additional follow up Details #2::    Pt. advised. He states he will call his dentist today to find out when he should stop ASA and Plavix. Follow-up by: Larita Fife Apolonio Cutting LPN,  May 08, 2010 2:18 PM

## 2010-06-04 NOTE — Letter (Signed)
Summary: Clearance Letter  [facility]  [address] [place] [address]   Phone: [phone]  Fax: [phone]    May 11, 2010  Re:     AGUSTINE ROSSITTO Address:   [address]     [place], [place]  [address] DOB:     [birth year] MRN:     [id number]   To Whom This May Concern,    Mr. Maki Sweetser has been cleared for oral procedures from a cardiac stand   point.  The patient states he has been holding Plavix and Aspirin since Wednesday May 06, 2010, we recommend that these procedures be performed within the next 2 days.  Generally  patient's on antiplatelet therapy should only hold these medications for 5-7 days prior to procedure   and resume both after procedure when safe. He is greater than 2 years out from DES.  I would   expect that he should be able to proceed with planned dental procedures.          Sincerely,  Dr. Nona Dell, MD, [facility]

## 2010-06-12 ENCOUNTER — Encounter (INDEPENDENT_AMBULATORY_CARE_PROVIDER_SITE_OTHER): Payer: Self-pay | Admitting: *Deleted

## 2010-06-12 ENCOUNTER — Telehealth (INDEPENDENT_AMBULATORY_CARE_PROVIDER_SITE_OTHER): Payer: Self-pay

## 2010-06-12 ENCOUNTER — Other Ambulatory Visit: Payer: Self-pay | Admitting: Cardiology

## 2010-06-12 ENCOUNTER — Ambulatory Visit (INDEPENDENT_AMBULATORY_CARE_PROVIDER_SITE_OTHER): Payer: Medicare Other | Admitting: Adult Health

## 2010-06-12 ENCOUNTER — Encounter: Payer: Self-pay | Admitting: Adult Health

## 2010-06-12 ENCOUNTER — Ambulatory Visit (HOSPITAL_COMMUNITY)
Admission: RE | Admit: 2010-06-12 | Discharge: 2010-06-12 | Disposition: A | Discharge status: Home / Self Care | Payer: Medicare Other | Source: Ambulatory Visit | Attending: Cardiology | Admitting: Cardiology

## 2010-06-12 DIAGNOSIS — J449 Chronic obstructive pulmonary disease, unspecified: Secondary | ICD-10-CM | POA: Insufficient documentation

## 2010-06-12 DIAGNOSIS — R079 Chest pain, unspecified: Secondary | ICD-10-CM

## 2010-06-12 DIAGNOSIS — I2510 Atherosclerotic heart disease of native coronary artery without angina pectoris: Secondary | ICD-10-CM

## 2010-06-12 DIAGNOSIS — I10 Essential (primary) hypertension: Secondary | ICD-10-CM

## 2010-06-12 DIAGNOSIS — J4489 Other specified chronic obstructive pulmonary disease: Secondary | ICD-10-CM | POA: Insufficient documentation

## 2010-06-12 DIAGNOSIS — F17200 Nicotine dependence, unspecified, uncomplicated: Secondary | ICD-10-CM | POA: Insufficient documentation

## 2010-06-12 LAB — CONVERTED CEMR LAB
BUN: 16 mg/dL (ref 6–23)
Basophils Absolute: 0.1 10*3/uL (ref 0.0–0.1)
Calcium: 9.6 mg/dL (ref 8.4–10.5)
Eosinophils Absolute: 0.2 10*3/uL (ref 0.0–0.7)
Eosinophils Relative: 3 % (ref 0–5)
Glucose, Bld: 72 mg/dL (ref 70–99)
HCT: 46 % (ref 39.0–52.0)
MCHC: 34.3 g/dL (ref 30.0–36.0)
MCV: 93.5 fL (ref 78.0–100.0)
Platelets: 228 10*3/uL (ref 150–400)
Prothrombin Time: 13.7 s (ref 11.6–15.2)
RDW: 13 % (ref 11.5–15.5)

## 2010-06-15 ENCOUNTER — Encounter: Payer: Self-pay | Admitting: Adult Health

## 2010-06-17 ENCOUNTER — Inpatient Hospital Stay (HOSPITAL_BASED_OUTPATIENT_CLINIC_OR_DEPARTMENT_OTHER)
Admission: RE | Admit: 2010-06-17 | Discharge: 2010-06-17 | Disposition: A | Discharge status: Hospice | Payer: Medicare Other | Source: Ambulatory Visit | Attending: Cardiovascular Disease | Admitting: Cardiovascular Disease

## 2010-06-17 ENCOUNTER — Observation Stay (HOSPITAL_COMMUNITY)
Admission: RE | Admit: 2010-06-17 | Discharge: 2010-06-18 | Disposition: A | Discharge status: Home / Self Care | Payer: Medicare Other | Source: Ambulatory Visit | Attending: Cardiovascular Disease | Admitting: Cardiovascular Disease

## 2010-06-17 DIAGNOSIS — F17200 Nicotine dependence, unspecified, uncomplicated: Secondary | ICD-10-CM | POA: Insufficient documentation

## 2010-06-17 DIAGNOSIS — E785 Hyperlipidemia, unspecified: Secondary | ICD-10-CM | POA: Insufficient documentation

## 2010-06-17 DIAGNOSIS — Z01810 Encounter for preprocedural cardiovascular examination: Secondary | ICD-10-CM | POA: Insufficient documentation

## 2010-06-17 DIAGNOSIS — Z9861 Coronary angioplasty status: Secondary | ICD-10-CM | POA: Insufficient documentation

## 2010-06-17 DIAGNOSIS — I10 Essential (primary) hypertension: Secondary | ICD-10-CM | POA: Insufficient documentation

## 2010-06-17 DIAGNOSIS — I2510 Atherosclerotic heart disease of native coronary artery without angina pectoris: Principal | ICD-10-CM | POA: Insufficient documentation

## 2010-06-17 DIAGNOSIS — I200 Unstable angina: Secondary | ICD-10-CM | POA: Insufficient documentation

## 2010-06-17 DIAGNOSIS — R9439 Abnormal result of other cardiovascular function study: Secondary | ICD-10-CM | POA: Insufficient documentation

## 2010-06-18 DIAGNOSIS — I200 Unstable angina: Secondary | ICD-10-CM

## 2010-06-18 LAB — BASIC METABOLIC PANEL
BUN: 9 mg/dL (ref 6–23)
Chloride: 108 mEq/L (ref 96–112)
GFR calc non Af Amer: >60 mL/min (ref >60)
Glucose, Bld: 95 mg/dL (ref 70–99)
Potassium: 3.9 mEq/L (ref 3.5–5.1)

## 2010-06-18 LAB — CBC
HCT: 42.3 % (ref 39.0–52.0)
MCV: 92.2 fL (ref 78.0–100.0)
RDW: 13.1 % (ref 11.5–15.5)
WBC: 6.5 10*3/uL (ref 4.0–10.5)

## 2010-06-18 NOTE — Progress Notes (Signed)
Summary: sob and chest pain  Phone Note Call from Patient Call back at Home Phone [phone]   Caller: pt Reason for Call: Talk to Nurse Summary of Call: pt have been sob and having chest pain with sort walking to mail box. Initial call taken by: Faythe Ghee,  June 12, 2010 8:34 AM  Follow-up for Phone Call        Pt. c/o sob and cp on exertion. Appt. scheduled with Harriet Pho, NP today at 1:40. Per K. Lawrence, NP pt. will have a D-dimer drawn and CXR before OV today. Pt. to come by office to obtain lab and CXR orders around 12:30 today.  Follow-up by: Larita Fife Via LPN,  June 12, 2010 10:08 AM  New Problems: DYSPNEA (ICD-786.05) CHEST PAIN (ICD-786.50)   New Problems: DYSPNEA (ICD-786.05) CHEST PAIN (ICD-786.50) New/Updated Medications: NITROGLYCERIN 0.4 MG SUBL (NITROGLYCERIN) Place 1 tablet under tongue as directed Prescriptions: NITROGLYCERIN 0.4 MG SUBL (NITROGLYCERIN) Place 1 tablet under tongue as directed  #25 x 3   Entered by:   Larita Fife Via LPN   Authorized by:   Loreli Slot, MD, [facility]   Signed by:   Larita Fife Via LPN on 14/78/2956   Method used:   Electronically to        [facility] [phone]* (retail)       [address]       [place], [place]  [address]       Ph: [phone] or [phone]       Fax: [phone]   RxID:   [id number]

## 2010-06-18 NOTE — Letter (Signed)
Summary: Cardiac Catheterization Instructions- [facility] Lab  [facility]  [address], [place] [address]   Phone: [phone]  Fax: [phone]     06/12/2010 MRN: [id number]  [name] Graham [address] [place], [place]  [address]  [place]  Dear [name], TURPEN are scheduled for a Cardiac Catheterization on 06/17/2010 with Dr.Arida  Please arrive to the 1st floor of the Heart and Vascular Center at [facility] at 101:00 am  on the day of your procedure. Please do not arrive before 6:30 a.m. Call the Heart and Vascular Center at [phone] if you are unable to make your appointmnet. The Code to get into the parking garage under the building is 0300 Take the elevators to the 1st floor. You must have someone to drive you home. Someone must be with you for the first 24 hours after you arrive home. Please wear clothes that are easy to get on and off and wear slip-on shoes. Do not eat or drink after midnight except water with your medications that morning. Bring all your medications and current insurance cards with you.  ___ DO NOT take these medications before your procedure: ________________________________________________________________  ___ Make sure you take your aspirin.  _x__ You may take ALL of your medications with water that morning. ________________________________________________________________________________________________________________________________  ___ DO NOT take ANY medications before your procedure.  ___ Pre-med instructions:  ________________________________________________________________________________________________________________________________  The usual length of stay after your procedure is 2 to 3 hours. This can vary.  If you have any questions, please call the office at the number listed above.   Teressa Lower RN

## 2010-06-18 NOTE — Assessment & Plan Note (Signed)
Summary: CHEST PAIN ADD ON PER LYNN/TMJ   Primary Provider:  Dr. Rudi Graham   History of Present Illness: [name] Graham is a 75 y/o CM with known history of CAD with CAD, DES to the RCA in 2009, DES to CX in 2007,IMI, COPD, hyperlipidemia, hypercholesterolemia who is followed her by Dr. Diona Browner.  He was last seen in Sept 2011 to discuss follow up stress myoview which showed evidence of probable inferior wall scar with mild degree of peri-infaract ishcemia at the inferior base. LVEF 43%.  He was continued on medical therapy.  He unfortunately continues to smoke.  He has had exertional chest pain over the last 2 weeks decribed as a "cold heartburn" with walking or feeding his dogs, or walking up an incline with mild DOE.  He denies dizziness or diaphoresis with this. He takes Ntg SL and the pain goes away.  He is experiencing this with every exertional acitvity with increasing frequency.  The pain does not radiate.  He did not experience radiation of chest pain/heartburn prior to stents, only centrally located discomfort on each occasion.   Preventive Screening-Counseling & Management  Alcohol-Tobacco     Smoking Status: current     Packs/Day: 1.0  Current Medications (verified): 1)  Metoprolol Tartrate 25 Mg Tabs (Metoprolol Tartrate) .... Take 1 Tab Daily 2)  Lisinopril 40 Mg Tabs (Lisinopril) .... Take 1 Tablet By Mouth Once Daily 3)  Nitroglycerin 0.4 Mg Subl (Nitroglycerin) .... Place 1 Tablet Under Tongue As Directed 4)  Simvastatin 80 Mg Tabs (Simvastatin) .... Take 1 Tablet By Mouth Every Night 5)  Aspir-Low 81 Mg Tbec (Aspirin) .... Take 1 Tab Daily 6)  Plavix 75 Mg Tabs (Clopidogrel Bisulfate) .... Take 1 Tab Daily 7)  Protonix 40 Mg Tbec (Pantoprazole Sodium) .... Take 1 Tablet By Mouth Once Daily  Allergies (verified): No Known Drug Allergies  Comments:  Nurse/Medical Assistant: The patient's medications and allergies were reviewed with the patient and were updated in  the Medication and Allergy Lists. [name] Graham CMA (June 12, 2010 1:55 PM)  Past History:  Past medical, surgical, family and social histories (including risk factors) reviewed, and no changes noted (except as noted below).  Past Medical History: Reviewed history from 01/15/2009 and no changes required. CAD - BMS circ and PTCA PDA 2007, DES RCA 10/09 C O P D G E R D Hyperlipidemia Hypertension Myocardial Infarction  Past Surgical History: Reviewed history from 01/15/2009 and no changes required. Unremarkable  Family History: Reviewed history from 01/14/2009 and no changes required. Father:deceased age 29 due to throat cancer Mother:deceased age 52 due to myocardial infarction  Siblings:2 sisters   Social History: Reviewed history from 01/14/2009 and no changes required. Retired  Divorced  Tobacco Use - Yes.  Alcohol Use - no Regular Exercise - no Drug Use - no Packs/Day:  1.0  Review of Systems       requiring NTG for relief.  All other systems have been reviewed and are negative unless stated above.   Vital Signs:  Patient profile:   75 year old male Height:      70 inches Weight:      163 pounds BMI:     23.47 O2 Sat:      97 % on Room air Pulse rate:   57 / minute BP sitting:   126 / 70  (left arm) Cuff size:   regular  Vitals Entered By: [name] Graham CMA (June 12, 2010 1:54 PM)  O2 Flow:  Room air  Physical Exam  General:  Well developed, well nourished, in no acute distress. Lungs:  Some crackles in the bases, but minimal.  No wheezes or rhonchi.  No cough Heart:  RRR with 1/6 systolic murmur.  He has no CB, or JVD.  Pulses are palpable. Abdomen:  Bowel sounds positive; abdomen soft and non-tender without masses, organomegaly, or hernias noted. No hepatosplenomegaly. Msk:  Back normal, normal gait. Muscle strength and tone normal. Pulses:  pulses normal in all 4 extremities Extremities:  No clubbing or cyanosis. Neurologic:  Alert and  oriented x 3. Psych:  Normal affect.   EKG  Procedure date:  06/12/2010  Findings:      Sinus bradycardia with rate of:  59bpm  Impression & Recommendations:  Problem # 1:  CORONARY ATHEROSCLEROSIS NATIVE CORONARY ARTERY (ICD-414.01) He is having recurrent pain with any exertional activity and requires NTG sublingual for relief.  With known CAD and stents to CX and RCA, will Graham cardiac catherization early next week. We have discussed this with the patient including known risks and he agrees to proceed. He was seen by Dr. Dietrich Pates and he is in agreement to proceed with catherization. His updated medication list for this problem includes:    Metoprolol Tartrate 25 Mg Tabs (Metoprolol tartrate) .Marland Kitchen... Take 1 tab daily    Lisinopril 40 Mg Tabs (Lisinopril) .Marland Kitchen... Take 1 tablet by mouth once daily    Nitroglycerin 0.4 Mg Subl (Nitroglycerin) .Marland Kitchen... Place 1 tablet under tongue as directed    Aspir-low 81 Mg Tbec (Aspirin) .Marland Kitchen... Take 1 tab daily    Plavix 75 Mg Tabs (Clopidogrel bisulfate) .Marland Kitchen... Take 1 tab daily  Problem # 2:  TOBACCO ABUSE (ICD-305.1) He is ready to quit but has had no luck with Chantix because it caused severe nightmares. We will revisit need for either Wellbutrin or Nicoderm along with smoking cessation instructions.  Problem # 3:  COPD (ICD-496) Will continue to monitor this.  CXR today demonstrated COPD with emphysema.  No acute cardiopulmonary disease.  There was evidence of nodular densities over the lung bases likely representing nipple shadows.  Other Orders: EKG w/ Interpretation (93000) T-Basic Metabolic Panel (81191-47829) T-CBC w/Diff [id number]) T-Protime, Auto ([id number]) T-PTT (41324-40102) Cardiac Catheterization (Cardiac Cath)  Patient Instructions: 1)  Your physician recommends that you schedule a follow-up appointment in: after Cath 2)  Your physician recommends that you return for lab work in: today 3)  Your physician recommends that you  continue on your current medications as directed. Please refer to the Current Medication list given to you today. 4)  Your physician has requested that you have a cardiac catheterization.  Cardiac catheterization is used to diagnose and/or treat various heart conditions. Doctors may recommend this procedure for a number of different reasons. The most common reason is to evaluate chest pain. Chest pain can be a symptom of coronary artery disease (CAD), and cardiac catheterization can show whether plaque is narrowing or blocking your heart's arteries. This procedure is also used to evaluate the valves, as well as measure the blood flow and oxygen levels in different parts of your heart.  For further information please visit [email or web address]  Please follow instruction sheet, as given. Prescriptions: PROTONIX 40 MG TBEC (PANTOPRAZOLE SODIUM) take 1 tablet by mouth once daily  #30 x 3   Entered by:   Larita Fife Via LPN   Authorized by:   Joni Reining, NP   Signed by:   Larita Fife Via LPN on 72/53/6644  Method used:   [facility] [phone][facility]retail[facility]       [address]       [place], [place]  [address]       Ph: [phone] or [phone]       Fax: [phone]   RxID:   [id number]

## 2010-06-19 ENCOUNTER — Encounter: Payer: Self-pay | Admitting: Cardiology

## 2010-06-24 NOTE — Discharge Summary (Addendum)
NAME:  [name] Graham, [name] Graham NO.:  [id number]  MEDICAL RECORD NO.:  [id number]           PATIENT TYPE:  I  LOCATION:  6533                         FACILITY:  [facility]  PHYSICIAN:  [name] Graham, M.D.  DATE OF BIRTH:  1936/03/24  DATE OF ADMISSION:  06/17/2010 DATE OF DISCHARGE:  06/18/2010                              DISCHARGE SUMMARY   DISCHARGE DIAGNOSES: 1. Unstable angina with new left anterior descending stenosis by     catheterization on June 17, 2010, status post Promus drug-     eluting stent placement to the proximal left anterior descending.     Catheterization also revealed significant three-vessel coronary     artery disease with patent stents in the left circumflex as well as     right coronary artery with only minor in-stent restenosis. 2. Prior history of coronary artery disease.     a.     Overlapping drug-eluting stent placement to the right      coronary artery in 2009.     b.     Non-ST-segment elevation myocardial infarction in 2007 with      bare-metal stent placement to the circumflex and percutaneous      transluminal coronary angioplasty to the right posterior      descending artery. 3. Left ventricular ejection fraction of 40-45% by catheterization on     June 17, 2010. 4. Hyperlipidemia, simvastatin changed to Lipitor this admission. 5. Tobacco abuse. 6. Hypertension.  HOSPITAL COURSE:  [name] Graham is a 74 year old gentleman with prior history of coronary artery disease as outlined above who presented to the [facility] Cardiology office with complaints of exertional chest pain requiring sublingual nitroglycerin for relief.  He also had mild dyspnea on exertion while walking up and inclining.  He described the chest pain as a cold heartburn sensation.  EKG showed sinus bradycardia with a rate of 59 beats per minute.  Given his known coronary artery disease and stent to left circumflex and RCA, the plan was to proceed with  cardiac catheterization this week.  He was brought into the hospital on June 17, 2010, which demonstrated the above findings and he subsequently had successful angioplasty and drug-eluting stent placement to the 95% stenosis in the proximal LAD with a Promus drug- eluting stents by Dr. Kirke Graham.  The patient tolerated the procedure well. Dr. [place] has seen and examined him today and feels he is stable for discharge.  DISCHARGE LABORATORY DATA:  WBC 6.5, hemoglobin 14.8, hematocrit 42.3, and platelet count 183.  Sodium 138, potassium 3.9, chloride 108, CO2 of 24, glucose 95, BUN 9, and creatinine 0.3.  STUDIES:  Cardiac catheterization on June 17, 2010, please see full report for details as well as HPI for summary.  DISCHARGE MEDICATIONS: 1. Lipitor 40 mg nightly.  The patient was previously on simvastatin     80 mg daily which is no longer the recommended dose and since he is     involved in an ongoing study here at the hospital, he will be     changed to Lipitor 40 mg nightly with instructions to repeat his  lipid panel and  liver function tests in 4-6 weeks. 2. Aspirin 325 mg daily. 3. Ibuprofen 1 tablet b.i.d. as needed with instructions to be aware     that this medicine can increase the risk of some of his bleeding on     medicines like aspirin and Plavix, so to only take as needed. 4. Lisinopril 40 mg daily. 5. Metoprolol tartrate 25 mg daily.  There was some confusion about     the patient's medication reconciliation as the pharmacy tech had     entered it as 50 mg b.i.d., but the patient states he is only     taking 1 tablet daily.  Office records indicate taking 25 mg daily.     Best formulation is used as a twice a day medicine, but he is     somewhat bradycardic which may be whey he is only once a day.  We     will defer to Dr. Diona Graham for assessment of dosing, but for now he     will remain on his home dose. 6. Nitroglycerin sublingual 0.4 mg every 5 minutes as  needed up to 3     doses. 7. Plavix 75 mg daily.  DISPOSITION:  [name] Graham will be discharged in stable condition to home.  He is not to lift anything for 1 week or chest pain or sexual activities for 1 weeks or drive for 2 days.  He is to follow a low- sodium, heart-healthy diet.  He is to call or return if he notices any swelling, bleeding, pus, pain, or any other problems with his cath site. He will follow up with Dr. Diona Graham on June 30, 2010, at 11:20 a.m. He will be discharged pending education with cardiac rehab.  DURATION OF DISCHARGE ENCOUNTER:  Greater than 30 minutes including physician and PA time.     [name] Graham, P.A.C.   ______________________________ [name] Graham, M.D.    DD/MEDQ  D:  06/18/2010  T:  06/18/2010  Job:  [id number]  cc:   Dr. Danella Graham, M.D.  Electronically Signed by [name] Graham  on 06/24/2010 11:40:57 AM Electronically Signed by [name] Graham M.D. on 07/01/2010 04:50:14 PM

## 2010-06-30 ENCOUNTER — Encounter (INDEPENDENT_AMBULATORY_CARE_PROVIDER_SITE_OTHER): Payer: Medicare Other | Admitting: Adult Health

## 2010-06-30 ENCOUNTER — Encounter: Payer: Self-pay | Admitting: Adult Health

## 2010-06-30 DIAGNOSIS — J449 Chronic obstructive pulmonary disease, unspecified: Secondary | ICD-10-CM

## 2010-06-30 DIAGNOSIS — I2510 Atherosclerotic heart disease of native coronary artery without angina pectoris: Secondary | ICD-10-CM

## 2010-07-01 ENCOUNTER — Encounter: Payer: Self-pay | Admitting: Adult Health

## 2010-07-09 NOTE — Assessment & Plan Note (Signed)
Summary: POST CATH PER DANA @ [facility]   Visit Type:  Follow-up Primary Provider:  Dr. Rudi Heap   History of Present Illness: [name] Graham is a friendly, talkative  75 y/o CM with known history of CAD with DES to RCA in 2009, DES to Cx in 207, COPD, and multiple ongoing CVRF.  He is here in office s/p cardiac catherization after experiencing recurrent chest pain described as a "cold heart burn" with walking associated with DOE.  He was found to have a 95% stenosis in the proximal LAD which required a DES per Dr.Arida.  He states that he feel 100% better and has been asymptomatic since.  He still has some bruising to the catheter insertion site, is tolerating plavix without over bleeding.  He is going back to cardiac rehab and is grateful to [facility] for "fixing my heart."  Current Medications (verified): 1)  Metoprolol Tartrate 25 Mg Tabs (Metoprolol Tartrate) .... Take 1 Tab Daily 2)  Lisinopril 40 Mg Tabs (Lisinopril) .... Take 1 Tablet By Mouth Once Daily 3)  Nitroglycerin 0.4 Mg Subl (Nitroglycerin) .... Place 1 Tablet Under Tongue As Directed 4)  Aspir-Low 81 Mg Tbec (Aspirin) .... Take 1 Tab Daily 5)  Plavix 75 Mg Tabs (Clopidogrel Bisulfate) .... Take 1 Tab Daily 6)  Protonix 40 Mg Tbec (Pantoprazole Sodium) .... Take 1 Tablet By Mouth Once Daily 7)  Simvastatin 40 Mg Tabs (Simvastatin) .... Take 1 Tablet By Mouth At Bedtime  Allergies (verified): No Known Drug Allergies  Comments:  Nurse/Medical Assistant: patient brought med list they have stopped his simvastatin in the hospital and started his lipitor [facility] [place]  Past History:  Past medical, surgical, family and social histories (including risk factors) reviewed, and no changes noted (except as noted below).  Past Medical History: Reviewed history from 01/15/2009 and no changes required. CAD - BMS circ and PTCA PDA 2007, DES RCA 10/09 [facility] Hyperlipidemia Hypertension Myocardial  Infarction  Past Surgical History: Reviewed history from 01/15/2009 and no changes required. Unremarkable  Family History: Reviewed history from 01/14/2009 and no changes required. Father:deceased age 89 due to throat cancer Mother:deceased age 40 due to myocardial infarction  Siblings:2 sisters   Social History: Reviewed history from 01/14/2009 and no changes required. Retired  Divorced  Tobacco Use - Yes.  Alcohol Use - no Regular Exercise - no Drug Use - no  Review of Systems       All other systems have been reviewed and are negative unless stated above.   Vital Signs:  Patient profile:   75 year old male Weight:      168 pounds BMI:     24.19 O2 Sat:      96 % on Room air Pulse rate:   58 / minute BP sitting:   127 / 69  (left arm)  Vitals Entered By: Dreama Saa, CNA (June 30, 2010 11:07 AM)  O2 Flow:  Room air  Physical Exam  General:  Well developed, well nourished, in no acute distress. Lungs:  Clear bilaterally to auscultation and percussion. Heart:  Non-displaced PMI, chest non-tender; regular rate and rhythm, S1, S2 without murmurs, rubs or gallops. Carotid upstroke normal, no bruit. Normal abdominal aortic size, no bruits. Femorals normal pulses, no bruits. Pedals normal pulses. No edema, no varicosities. Abdomen:  Bowel sounds positive; abdomen soft and non-tender without masses, organomegaly, or hernias noted. No hepatosplenomegaly. Msk:  Back normal, normal gait. Muscle strength  and tone normal. Pulses:  pulses normal in all 4 extremities Extremities:  No clubbing or cyanosis. Neurologic:  Alert and oriented x 3. Psych:  Normal affect.   EKG  Procedure date:  06/30/2010  Findings:       Atrium and ventricle are paced.    EKG  Procedure date:  06/30/2010  Findings:      Sinus bradycardia with rate of:  53 bpm  Impression & Recommendations:  Problem # 1:  CORONARY ATHEROSCLEROSIS NATIVE CORONARY ARTERY (ICD-414.01) He is doing  very well, states he has absolutely no symptoms at this time.  He is able to be more active and plans on returning to cardiac rehab.  He remains medically complaint.  He is encouraged to continue this.  We will see him again in 3 months unless he is symptomatic. His updated medication list for this problem includes:    Metoprolol Tartrate 25 Mg Tabs (Metoprolol tartrate) .Marland Kitchen... Take 1 tab daily    Lisinopril 40 Mg Tabs (Lisinopril) .Marland Kitchen... Take 1 tablet by mouth once daily    Nitroglycerin 0.4 Mg Subl (Nitroglycerin) .Marland Kitchen... Place 1 tablet under tongue as directed    Aspir-low 81 Mg Tbec (Aspirin) .Marland Kitchen... Take 1 tab daily    Plavix 75 Mg Tabs (Clopidogrel bisulfate) .Marland Kitchen... Take 1 tab daily  Problem # 2:  TOBACCO ABUSE (ICD-305.1) He is again advised to quit smoking.  Problem # 3:  DYSPNEA (ICD-786.05) Assessment: Improved  His updated medication list for this problem includes:    Metoprolol Tartrate 25 Mg Tabs (Metoprolol tartrate) .Marland Kitchen... Take 1 tab daily    Lisinopril 40 Mg Tabs (Lisinopril) .Marland Kitchen... Take 1 tablet by mouth once daily    Aspir-low 81 Mg Tbec (Aspirin) .Marland Kitchen... Take 1 tab daily  Problem # 4:  HYPERLIPIDEMIA (ICD-272.4) He requests to return to simvistatin as this is less costly for him per his insurance.  He will be started on 40mg  at HS. The following medications were removed from the medication list:    Simvastatin 80 Mg Tabs (Simvastatin) .Marland Kitchen... Take 1 tablet by mouth every night His updated medication list for this problem includes:    Simvastatin 40 Mg Tabs (Simvastatin) .Marland Kitchen... Take 1 tablet by mouth at bedtime  Patient Instructions: 1)  Your physician recommends that you schedule a follow-up appointment in: 3 months 2)  Your physician has recommended you make the following change in your medication: Stop taking Lipitor and start taking Simvastatin 40mg  by mouth at bedtime  Prescriptions: SIMVASTATIN 40 MG TABS (SIMVASTATIN) take 1 tablet by mouth at bedtime  #30 x 3   Entered by:    Larita Fife Via LPN   Authorized by:   Joni Reining, NP   Signed by:   Larita Fife Via LPN on 54/01/8118   Method used:   [facility] to        [facility] [phone][facility] (retail)       [address]       [place], [place]  [address]       Ph: [phone] or [phone]       Fax: [phone]   RxID:   [id number]

## 2010-07-20 ENCOUNTER — Encounter (HOSPITAL_COMMUNITY)
Admission: RE | Admit: 2010-07-20 | Discharge: 2010-07-20 | Disposition: A | Discharge status: Home / Self Care | Payer: Medicare Other | Source: Ambulatory Visit | Attending: Cardiology | Admitting: Cardiology

## 2010-07-20 DIAGNOSIS — Z5189 Encounter for other specified aftercare: Secondary | ICD-10-CM | POA: Insufficient documentation

## 2010-07-20 DIAGNOSIS — I2510 Atherosclerotic heart disease of native coronary artery without angina pectoris: Secondary | ICD-10-CM | POA: Diagnosis not present

## 2010-07-20 DIAGNOSIS — I200 Unstable angina: Secondary | ICD-10-CM | POA: Diagnosis not present

## 2010-07-20 DIAGNOSIS — Z9861 Coronary angioplasty status: Secondary | ICD-10-CM | POA: Diagnosis not present

## 2010-07-20 DIAGNOSIS — I252 Old myocardial infarction: Secondary | ICD-10-CM | POA: Diagnosis not present

## 2010-07-22 ENCOUNTER — Encounter (HOSPITAL_COMMUNITY): Payer: Medicare Other

## 2010-07-22 DIAGNOSIS — Z5189 Encounter for other specified aftercare: Secondary | ICD-10-CM | POA: Diagnosis not present

## 2010-07-24 ENCOUNTER — Encounter (HOSPITAL_COMMUNITY): Payer: Medicare Other

## 2010-07-24 DIAGNOSIS — Z5189 Encounter for other specified aftercare: Secondary | ICD-10-CM | POA: Diagnosis not present

## 2010-07-27 ENCOUNTER — Encounter (HOSPITAL_COMMUNITY): Payer: Medicare Other

## 2010-07-29 ENCOUNTER — Encounter (HOSPITAL_COMMUNITY): Payer: Medicare Other

## 2010-07-29 DIAGNOSIS — Z5189 Encounter for other specified aftercare: Secondary | ICD-10-CM | POA: Diagnosis not present

## 2010-07-30 NOTE — Procedures (Signed)
NAME:  [name] Graham, [name] Graham NO.:  [id number]  MEDICAL RECORD NO.:  [id number]           PATIENT TYPE:  LOCATION:                                 FACILITY:  PHYSICIAN:  Lorine Bears, MD     DATE OF BIRTH:  1935-05-23  DATE OF PROCEDURE: DATE OF DISCHARGE:                           CARDIAC CATHETERIZATION   REFERRING PHYSICIAN:  Bettey Mare. Lyman Bishop, NP  PROCEDURES PERFORMED: 1. Left heart catheterization. 2. Coronary angiography. 3. Left ventricular angiography.  INDICATIONS AND CLINICAL HISTORY:  Mr. Hobby is a 75 year old gentleman with known history of coronary artery disease status post multiple angioplasty and drug-eluting stent placement to both the right coronary artery and left circumflex done in 2007 and 2009.  He also has multiple risk factors for coronary artery disease that include hyperlipidemia, hypertension, and continued tobacco use.  He presented to the outpatient clinic with progressive symptoms of chest tightness with activities suggestive of class III angina.  He did have a previous stress test in September 2011 which showed an ejection fraction of 43% with inferior wall scar with mild peri-infarct ischemia.  Due to the progression of his symptoms and abnormal stress test, cardiac catheterization was recommended.  Risks, benefits, and alternatives were discussed with the patient.  STUDY DETAILS:  Standard informed consent was obtained.  He was given fentanyl and Versed for sedation.  The right groin area was prepped in a sterile fashion.  It was anesthetized with 1% lidocaine.  The right femoral artery was accessed by an anterior puncture.  A 4-French sheath was placed in the femoral artery.  Coronary angiography was performed with a JL-4, JR-4, and a pigtail catheter.  All catheter exchanges were done over the wire.  Central aortic and left ventricular pressures were recorded.  A pullback was performed across the aortic valve.   Left ventricular angiography was performed in the RAO 30 position.  The patient tolerated the procedure well with no immediate complications.  STUDY FINDINGS:  Hemodynamic findings:  Aortic pressure was 148/68 with a mean pressure of 97 mmHg.  Left ventricular pressure was 140/8 with a left ventricular end-diastolic pressure of 16 mmHg.  No significant gradient across the aortic valve was noted.  Coronary angiography:  Left main coronary artery:  The vessel was normal in size and overall, it is free of any significant disease.  Left anterior descending artery:  The vessel was normal in size.  There is 10-20% diffuse disease proximally followed by 95% lesion just before the first septal and first diagonal.  The lesion looks hazy.  The rest of the LAD has minor irregularities with no other obstructive disease. The first diagonal is a normal-sized vessel with 20-30% ostial stenosis. The second and third diagonals are overall small in size.  Left circumflex artery:  The vessel was normal in size.  A stent is noted in the mid segment which is patent without any significant restenosis.  Proximal to the stent, there is a 20% discrete stenosis. In the mid circumflex after the stent, there is also another 30% stenosis.  First and second obtuse marginals are overall small in size with no  significant disease.  The third obtuse marginal is relatively large-sized vessel and free of significant disease.  Right coronary artery:  The vessel was normal in size and dominant. Multiple overlap stents are noted from the proximal to the mid segment. The stent itself has 10-20% diffuse in-stent restenosis.  Proximal to the stent in the proximal segment, there is a 30% stenosis.  The rest of the right coronary artery is free of significant disease.  The right PDA is normal in size with no significant disease.  There are two medium- sized posterolateral branches which are free of significant  disease.  Left ventricular angiography:  This showed mildly reduced LV systolic function with an ejection fraction of 40-45% with mild global hypokinesis.  CONCLUSIONS: 1. Significant three-vessel coronary artery disease with patent stents     in the left circumflex as well as right coronary artery with only     minor in-stent restenosis. 2. New severe stenosis in the proximal left anterior descending     artery. 3. Mildly reduced left ventricular systolic function with mildly     elevated left ventricular end-diastolic pressure.  RECOMMENDATIONS:  I recommend an angioplasty and a drug-eluting stent placement to the proximal LAD which will be done today due to the severity of the lesion and as well as the hazy appearance.     Lorine Bears, MD     MA/MEDQ  D:  06/17/2010  T:  06/18/2010  Job:  [id number]  cc:   Bettey Mare. Lyman Bishop, NP Ernestina Penna, M.D.  Electronically Signed by Lorine Bears MD on 07/30/2010 09:03:31 AM

## 2010-07-30 NOTE — Procedures (Signed)
  NAME:  [name] Graham, [name] Graham             ACCOUNT NO.:  [id number]  MEDICAL RECORD NO.:  [id number]           PATIENT TYPE:  I  LOCATION:  6533                         FACILITY:  [facility]  PHYSICIAN:  Lorine Bears, MD     DATE OF BIRTH:  1935/05/17  DATE OF PROCEDURE:  06/17/2010 DATE OF DISCHARGE:                           CARDIAC CATHETERIZATION   REFERRING PHYSICIAN:  Bettey Mare. Lyman Bishop, NP  PRIMARY CARE PHYSICIAN:  Ernestina Penna, MD  PROCEDURES PERFORMED:  Angioplasty and drug-eluting stent placement to the proximal LAD with a placement of 3.0 x 16 mm Promus Element drug- eluting stent.  ACCESS:  Right femoral artery.  HISTORY AND INDICATION:  Please refer to my note from today of cardiac catheterization.  INTERVENTIONAL PROCEDURE DETAILS:  The patient was given Versed and fentanyl for sedation.  The right groin area with the 4-French sheath was prepped in a sterile fashion.  The sheath was then exchanged to a 6- [place] sheath.  IV bivalirudin was initiated with subsequent therapeutic ACT.  The patient was already given 300 mg of Plavix and 325 mg of aspirin.  I used an XB3.5 guiding catheter with no side holes.  The lesion was crossed with an Intuition wire.  I then predilated the lesion with a 2.5 x 12 mm TREK balloon.  The balloon was occlusive.  I went up initially to 4 atmospheres and then more proximally to 8 atmospheres.  I then placed a 3.0 x 16 mm Promus Element stent and deployed to 16 atmospheres.  This was postdilated with 3.25 x 15 mm noncompliant balloon to 16 atmospheres.  Angiography showed excellent results with TIMI 3 flow.  There is a normal-sized diagonal branch which was jailed by the stent.  The ostium of the diagonal had 40% stenosis after deploying the stent with TIMI 3 flow.  The wire in the guiding catheter were then removed.  The sheath was sutured in place to be removed manually.  CONCLUSIONS:  Successful angioplasty and drug-eluting stent  placement to a 95% stenosis in the proximal LAD.  The stent was a 3.0 x 16 mm Promus drug-eluting stent which was postdilated with 3.25-mm noncompliant balloon.  RECOMMENDATIONS: 1. Recommend aspirin daily indefinitely as well as Plavix 75 mg once     daily for at least 12 months and if possible indefinitely due to     multiple drug-eluting stents. 2. Smoking cessation is strongly advised.  Aggressive treatment of     risk factors is recommended.     Lorine Bears, MD     MA/MEDQ  D:  06/17/2010  T:  06/18/2010  Job:  [id number]  cc:   Bettey Mare. Lyman Bishop, NP Ernestina Penna, M.D.  Electronically Signed by Lorine Bears MD on 07/30/2010 09:02:00 AM

## 2010-07-31 ENCOUNTER — Encounter (HOSPITAL_COMMUNITY): Payer: Medicare Other

## 2010-07-31 DIAGNOSIS — Z5189 Encounter for other specified aftercare: Secondary | ICD-10-CM | POA: Diagnosis not present

## 2010-08-03 ENCOUNTER — Encounter (HOSPITAL_COMMUNITY)
Admission: RE | Admit: 2010-08-03 | Discharge: 2010-08-03 | Disposition: A | Discharge status: Home / Self Care | Payer: Medicare Other | Source: Ambulatory Visit | Attending: Cardiology | Admitting: Cardiology

## 2010-08-03 DIAGNOSIS — Z9861 Coronary angioplasty status: Secondary | ICD-10-CM | POA: Insufficient documentation

## 2010-08-03 DIAGNOSIS — I200 Unstable angina: Secondary | ICD-10-CM | POA: Insufficient documentation

## 2010-08-03 DIAGNOSIS — Z5189 Encounter for other specified aftercare: Secondary | ICD-10-CM | POA: Insufficient documentation

## 2010-08-03 DIAGNOSIS — I2510 Atherosclerotic heart disease of native coronary artery without angina pectoris: Secondary | ICD-10-CM | POA: Insufficient documentation

## 2010-08-03 DIAGNOSIS — I252 Old myocardial infarction: Secondary | ICD-10-CM | POA: Insufficient documentation

## 2010-08-05 ENCOUNTER — Encounter (HOSPITAL_COMMUNITY): Payer: Medicare Other

## 2010-08-05 LAB — SEDIMENTATION RATE: Sed Rate: 4 mm/hr (ref 0–16)

## 2010-08-05 LAB — BASIC METABOLIC PANEL
BUN: 12 mg/dL (ref 6–23)
CO2: 28 mEq/L (ref 19–32)
Calcium: 9.1 mg/dL (ref 8.4–10.5)
Creatinine, Ser: 0.88 mg/dL (ref 0.4–1.5)
GFR calc Af Amer: >60 mL/min (ref >60)

## 2010-08-05 LAB — CBC
MCHC: 34.4 g/dL (ref 30.0–36.0)
Platelets: 194 10*3/uL (ref 150–400)
RBC: 4.7 MIL/uL (ref 4.22–5.81)
WBC: 8.4 10*3/uL (ref 4.0–10.5)

## 2010-08-05 LAB — DIFFERENTIAL
Basophils Absolute: 0 10*3/uL (ref 0.0–0.1)
Eosinophils Absolute: 0.2 10*3/uL (ref 0.0–0.7)
Lymphs Abs: 2 10*3/uL (ref 0.7–4.0)
Neutrophils Relative %: 69 % (ref 43–77)

## 2010-08-05 LAB — C-REACTIVE PROTEIN: CRP: 0.1 mg/dL — ABNORMAL LOW (ref <0.6)

## 2010-08-07 ENCOUNTER — Encounter (HOSPITAL_COMMUNITY): Payer: Medicare Other

## 2010-08-10 ENCOUNTER — Encounter (HOSPITAL_COMMUNITY): Payer: Medicare Other

## 2010-08-11 ENCOUNTER — Other Ambulatory Visit: Payer: Self-pay | Admitting: Cardiology

## 2010-08-12 ENCOUNTER — Encounter (HOSPITAL_COMMUNITY): Payer: Medicare Other

## 2010-08-13 ENCOUNTER — Other Ambulatory Visit: Payer: Self-pay

## 2010-08-13 MED ORDER — CLOPIDOGREL BISULFATE 75 MG PO TABS: 75.0000 mg | ORAL_TABLET | Freq: Every day | ORAL | Status: DC

## 2010-08-13 MED ORDER — SIMVASTATIN 40 MG PO TABS: 40.0000 mg | ORAL_TABLET | Freq: Every day | ORAL | Status: DC

## 2010-08-14 ENCOUNTER — Telehealth: Payer: Self-pay | Admitting: Cardiology

## 2010-08-14 ENCOUNTER — Encounter (HOSPITAL_COMMUNITY): Payer: Medicare Other

## 2010-08-14 NOTE — Telephone Encounter (Signed)
Patient needs refill for Plavix and he one other medicine that he could not remember the name of called to [facility] / states that he called the refills in to them so they are aware of the "other" medication that he needs / tg

## 2010-08-14 NOTE — Telephone Encounter (Signed)
Pt. advised [facility] [place] [facility] for pick up.

## 2010-08-17 ENCOUNTER — Encounter (HOSPITAL_COMMUNITY): Payer: Medicare Other

## 2010-08-17 ENCOUNTER — Encounter: Payer: Self-pay | Admitting: Cardiology

## 2010-08-19 ENCOUNTER — Encounter (HOSPITAL_COMMUNITY): Admission: RE | Admit: 2010-08-19 | Payer: Medicare Other | Source: Ambulatory Visit

## 2010-08-21 ENCOUNTER — Encounter (HOSPITAL_COMMUNITY): Payer: Medicare Other

## 2010-08-24 ENCOUNTER — Encounter (HOSPITAL_COMMUNITY): Payer: Medicare Other

## 2010-08-26 ENCOUNTER — Encounter (HOSPITAL_COMMUNITY): Payer: Medicare Other

## 2010-08-28 ENCOUNTER — Encounter (HOSPITAL_COMMUNITY): Payer: Medicare Other

## 2010-08-31 ENCOUNTER — Encounter (HOSPITAL_COMMUNITY): Payer: Medicare Other

## 2010-09-02 ENCOUNTER — Encounter (HOSPITAL_COMMUNITY): Payer: Medicare Other

## 2010-09-04 ENCOUNTER — Encounter (HOSPITAL_COMMUNITY): Payer: Medicare Other

## 2010-09-07 ENCOUNTER — Encounter (HOSPITAL_COMMUNITY): Payer: Medicare Other

## 2010-09-09 ENCOUNTER — Encounter: Payer: Self-pay | Admitting: Cardiology

## 2010-09-09 ENCOUNTER — Encounter (HOSPITAL_COMMUNITY): Payer: Medicare Other

## 2010-09-10 ENCOUNTER — Ambulatory Visit (INDEPENDENT_AMBULATORY_CARE_PROVIDER_SITE_OTHER): Payer: Medicare Other | Admitting: Cardiology

## 2010-09-10 ENCOUNTER — Encounter: Payer: Self-pay | Admitting: Cardiology

## 2010-09-10 VITALS — BP 131/73 | HR 55 | Ht 70.0 in | Wt 161.0 lb

## 2010-09-10 DIAGNOSIS — I2510 Atherosclerotic heart disease of native coronary artery without angina pectoris: Secondary | ICD-10-CM

## 2010-09-10 DIAGNOSIS — I10 Essential (primary) hypertension: Secondary | ICD-10-CM

## 2010-09-10 DIAGNOSIS — E782 Mixed hyperlipidemia: Secondary | ICD-10-CM

## 2010-09-10 NOTE — Assessment & Plan Note (Signed)
Symptomatically stable on present medical therapy. Continue cardiac rehabilitation.

## 2010-09-10 NOTE — Assessment & Plan Note (Addendum)
Blood pressure control is reasonable today. It has been up some during cardiac rehabilitation. I encouraged sodium restriction, regular exercise. No changes made today.

## 2010-09-10 NOTE — Progress Notes (Signed)
Clinical Summary [name] Graham is a 75 y.o.male presenting for followup. He reports no angina, no nitroglycerin use, stable NYHA class II dyspnea on exertion. He continues in cardiac rehabilitation at [facility].  He is due for followup lab work for assessment of lipid status and liver function. Reports compliance with his medications including aspirin and Plavix. No unusual muscle soreness or weakness.  No Known Allergies  Current outpatient prescriptions:aspirin 325 MG tablet, Take 325 mg by mouth daily.  , Disp: , Rfl: ;  clopidogrel (PLAVIX) 75 MG tablet, Take 1 tablet (75 mg total) by mouth daily., Disp: 30 tablet, Rfl: 2;  lisinopril (PRINIVIL,ZESTRIL) 40 MG tablet, Take 40 mg by mouth daily. , Disp: , Rfl: ;  metoprolol tartrate (LOPRESSOR) 25 MG tablet, Take 25 mg by mouth daily. , Disp: , Rfl:  NITROSTAT 0.4 MG SL tablet, Place 0.4 mg under the tongue every 5 (five) minutes as needed. , Disp: , Rfl: ;  simvastatin (ZOCOR) 40 MG tablet, Take 1 tablet (40 mg total) by mouth at bedtime., Disp: 30 tablet, Rfl: 2;  DISCONTD: pantoprazole (PROTONIX) 40 MG tablet, Take 40 mg by mouth daily. , Disp: , Rfl: ;  DISCONTD: PLAVIX 75 MG tablet, TAKE 1 TABLET EVERY DAY, Disp: 30 tablet, Rfl: 2  Past Medical History  Diagnosis Date  . CAD (coronary artery disease)     BMS circ and PTCA PDA 2007 ,DES RCA 10/09, DES LAD 2/12  . COPD (chronic obstructive pulmonary disease)   . GERD (gastroesophageal reflux disease)   . Hyperlipidemia   . Hypertension   . Myocardial infarction     Social History [name] reports that he has been smoking Cigarettes.  He has never used smokeless tobacco. [name] reports that he does not drink alcohol.  Review of Systems No reported bleeding problems, no palpitations or syncope. Otherwise reviewed and negative.  Physical Examination Filed Vitals:   09/10/10 1313  BP: 131/73  Pulse: 55  Normally nourished appearing male in no acute distress. HEENT:  Conjunctiva and lids normal, oropharynx with moist mucosa, poor dentition. Neck: Supple, no carotid bruits or elevated JVP. Lungs: Clear with diminished breath sounds, nonlabored. Cardiac: Regular rate and rhythm, no significant systolic murmur or S3. Abdomen: Soft, nontender, bowel sounds present. Skin: Warm and dry. Extremities: No pitting edema, distal pulses 2+. Musculoskeletal: No kyphosis. Neuropsychiatric: Alert and oriented x3, affect appropriate.  Studies Cardiac catheterization 06/17/2010:  Coronary angiography: Left main coronary artery:  The vessel was normal in size and overall, it is free of any significant disease. Left anterior descending artery:  The vessel was normal in size.  There is 10-20% diffuse disease proximally followed by 95% lesion just before the first septal and first diagonal.  The lesion looks hazy.  The rest of the LAD has minor irregularities with no other obstructive disease. The first diagonal is a normal-sized vessel with 20-30% ostial stenosis. The second and third diagonals are overall small in size. Left circumflex artery:  The vessel was normal in size.  A stent is noted in the mid segment which is patent without any significant restenosis.  Proximal to the stent, there is a 20% discrete stenosis. In the mid circumflex after the stent, there is also another 30% stenosis.  First and second obtuse marginals are overall small in size with no significant disease.  The third obtuse marginal is relatively large-sized vessel and free of significant disease. Right coronary artery:  The vessel was normal in size and dominant.  Multiple overlap stents are noted from the proximal to the mid segment. The stent itself has 10-20% diffuse in-stent restenosis.  Proximal to the stent in the proximal segment, there is a 30% stenosis.  The rest of the right coronary artery is free of significant disease.  The right PDA is normal in size with no significant  disease.  There are two medium- sized posterolateral branches which are free of significant disease. Left ventricular angiography:  This showed mildly reduced LV systolic function with an ejection fraction of 40-45% with mild global hypokinesis.  Problem List and Plan

## 2010-09-10 NOTE — Patient Instructions (Signed)
**Note De-Identified Alie Moudy Obfuscation** Your physician recommends that you continue on your current medications as directed. Please refer to the Current Medication list given to you today.  Your physician recommends that you return for lab work in: This week  Your physician recommends that you schedule a follow-up appointment in: 4 months

## 2010-09-10 NOTE — Assessment & Plan Note (Signed)
Due for followup fasting lipid profile and liver function tests. 

## 2010-09-11 ENCOUNTER — Other Ambulatory Visit: Payer: Self-pay | Admitting: Cardiology

## 2010-09-11 ENCOUNTER — Encounter (HOSPITAL_COMMUNITY)
Admission: RE | Admit: 2010-09-11 | Discharge: 2010-09-11 | Disposition: A | Discharge status: Home / Self Care | Payer: Medicare Other | Source: Ambulatory Visit | Attending: Cardiology | Admitting: Cardiology

## 2010-09-11 ENCOUNTER — Other Ambulatory Visit: Payer: Self-pay | Admitting: Cardiovascular Disease

## 2010-09-11 DIAGNOSIS — Z9861 Coronary angioplasty status: Secondary | ICD-10-CM | POA: Insufficient documentation

## 2010-09-11 DIAGNOSIS — I200 Unstable angina: Secondary | ICD-10-CM | POA: Insufficient documentation

## 2010-09-11 DIAGNOSIS — Z5189 Encounter for other specified aftercare: Secondary | ICD-10-CM | POA: Insufficient documentation

## 2010-09-11 DIAGNOSIS — I252 Old myocardial infarction: Secondary | ICD-10-CM | POA: Insufficient documentation

## 2010-09-11 DIAGNOSIS — I2510 Atherosclerotic heart disease of native coronary artery without angina pectoris: Secondary | ICD-10-CM | POA: Insufficient documentation

## 2010-09-12 LAB — HEPATIC FUNCTION PANEL
ALT: 10 U/L (ref 0–53)
Alkaline Phosphatase: 44 U/L (ref 39–117)
Bilirubin, Direct: 0.1 mg/dL (ref 0.0–0.3)
Indirect Bilirubin: 0.3 mg/dL (ref 0.0–0.9)
Total Protein: 6.4 g/dL (ref 6.0–8.3)

## 2010-09-12 LAB — LIPID PANEL
LDL Cholesterol: 79 mg/dL (ref 0–99)
Triglycerides: 52 mg/dL (ref <150)
VLDL: 10 mg/dL (ref 0–40)

## 2010-09-14 ENCOUNTER — Other Ambulatory Visit: Payer: Self-pay

## 2010-09-14 ENCOUNTER — Encounter (HOSPITAL_COMMUNITY): Admission: RE | Admit: 2010-09-14 | Payer: Medicare Other | Source: Ambulatory Visit

## 2010-09-14 MED ORDER — LISINOPRIL 40 MG PO TABS: 40.0000 mg | ORAL_TABLET | Freq: Every day | ORAL | Status: DC

## 2010-09-15 NOTE — Cardiovascular Report (Signed)
NAME:  JENO, CALLEROS NO.:  [id number]   MEDICAL RECORD NO.:  [id number]          PATIENT TYPE:  INP   LOCATION:  2807                         FACILITY:  [facility]   PHYSICIAN:  Veverly Fells. Excell Seltzer, MD  DATE OF BIRTH:  May 25, 1935   DATE OF PROCEDURE:  02/26/2008  DATE OF DISCHARGE:                            CARDIAC CATHETERIZATION   PROCEDURES:  1. Left heart catheterization.  2. Selective coronary angiography.  3. Left ventricular angiography.  4. Intracoronary vascular ultrasound of the left anterior descending.  5. Percutaneous transluminal coronary angioplasty and stenting of the      right coronary artery with overlapping drug-eluting stents.  6. Angio-Seal of the right femoral artery.   INDICATIONS:  Mr. Evitts is a 75 year old gentleman with known  coronary artery disease.  He presented with classic symptoms of unstable  angina.  He was referred for cardiac catheterization.  He has had prior  PCI in 2007.   Risks and indications of the procedure were reviewed with the patient.  Informed consent was obtained.  The right groin was prepped, draped, and  anesthetized with 1% lidocaine using modified-Seldinger technique.  A 5-  French sheath was placed in the right femoral artery.  Standard 5-French  Judkins catheters were used for coronary angiography and left  ventriculography.  A pullback across the aortic valve was done.  At  completion of the diagnostic procedure, I elected to perform IVUS of the  LAD and PCI of the right coronary artery.  The diagnostic procedure  demonstrated a hazy-appearing indeterminate stenosis in the mid LAD at  the region of the first diagonal origin.  The right coronary artery had  severe tandem lesions in the tortuous segment of the mid vessel that  were 80% stenosed.   The sheath was up sized to a 6-French.  300 mg of Plavix was given.  Of  note, the patient is on chronic Plavix as well as aspirin.  Angiomax was  used for  anticoagulation.  Once therapeutic ACT was achieved, 6-French  XB 3.5 cm LAD guide catheter was inserted.  A cougar wire was used to  wire the proximal vessel and was placed in the distal LAD.  Intracoronary nitroglycerin was given.  The IVUS catheter was advanced  down beyond the area of concern and an automatic pullback was performed.  This demonstrated a mild plaque with concentric calcification.  The  minimal lumen area was just above 4 square millimeters.  There was no  evidence of ulceration or dissection.  I elected to treat this area  medically.  The diagonal origin appeared widely patent.   At that point, attention was turned to the right coronary artery.  A JR-  4 guide catheter was inserted.  There were severe tandem stenoses in the  mid vessel.  Initially, I predilated the vessel with a 2.5 x 12 mm  Sprinter balloon taken to 10 atmospheres over both lesions.  I then  tried to focally stent the distal lesion.  A 3.5 x 12-mm endeavor stent  was used and carefully positioned.  It provided complete lesion  coverage.  It  was deployed at 14 atmospheres.  Following stenting, there  was severe stenosis along the proximal edge with haziness.  I suspected  an edge dissection.  At that point, the distance between the 2 lesions  would be minimal after covering the edge dissection with another stent.  I elected to treat the entire area, so that there were no gaps and stent  coverage in the mid vessel.  A 3.5 x 30-mm endeavor was used and  deployed at 12 atmospheres.  The stent was well expanded.  It did not  cover the most proximal lesion however.  Therefore, I attempted to place  a third stent with an endeavor 3.5 x 15 mm.  It would not pass across  the proximal area of the vessel.  It was very tortuous into a tight  stenosis as well as a proximal stent edge.  I went back in and tried to  pass a 3.75 x 12-mm Voyager [place] balloon and it would not pass.  I then  used the Sprinter Legend 3.0 x  15-mm balloon, which passed easily and  was dilated to 14 atmospheres over the proximal stent edge.  I then  attempted to pass the 3.5 x 15-mm endeavor stent once again and it still  would not pass.  At that point, I used a buddy wire.  A Yahoo Wire  was chosen.  The stent then passed easily over the [facility] after  it was passed into the distal RCA.  The cougar wire was pulled out and  the proximal stent was deployed at 14 atmospheres.  I then advanced the  stent balloon forward to post dilate the overlap at 16 atmospheres.  The  Voyager [place] balloon was then passed down and there were 5 or 6 inflations  done to cover the entire stented segment up to 16 atmospheres.  I did  not use any of those runs in order to minimize radiation exposure.  At  completion of the procedure, there was an excellent angiographic result,  stented segment was widely patent.  There was no residual stenosis  present.  There was TIMI III flow in the vessel.  The guide catheter  wire were removed and an Angio-Seal device was used to close the femoral  arteriotomy.   FINDINGS:  Aortic pressure 153/70 with a mean of 101, left ventricular  pressure 148/11.   Left mainstem:  Widely patent.  Bifurcates into LAD and left circumflex.   LAD is a moderate-sized vessel and courses down and reaches the LV apex.  There is a hazy stenosis in the proximal vessel of the origin in the  first diagonal.  The first diagonal is widely patent.  Intravascular  ultrasound was performed with findings as detailed above.  There was  nonobstructive disease with a minimal lumen area just over 4 square  millimeters.  There was mild concentric calcified plaque.  Remaining  portion of the vessel had no significant angiographic stenosis.   Left circumflex.  Left circumflex was a large vessel.  There was a  widely patent stent in the proximal vessel.  There was no restenosis  present.  The distal vessel had mild diffuse disease that  appeared  nonobstructive and unchanged from a prior catheterization in 2007.   Right coronary artery is tortuous.  It is a large vessel that courses  down and supplies a PDA branch and posterolateral branch.  There were  tandem mid lesions of 80% at the junction of the  proximal and mid vessel  and then at the junction of the mid to distal vessel.  The distal right  coronary artery bifurcation that was previously treated with balloon  angioplasty appears widely patent with no significant stenosis.  The PDA  and posterolateral branches are small to moderate-sized vessels.  There  was TIMI III flow in the vessel.   Left ventriculography demonstrated normal LV function with an LVEF of  55%.  There is no significant mitral regurgitation.   ASSESSMENT:  1. Severe right coronary artery stenosis with successful percutaneous      coronary intervention using overlapping drug-eluting stents.  2. Widely patent left circumflex stent.  3. Moderate left anterior descending stenosis.  4. Preserved left ventricular function.   DISCUSSION:  Mr. Kohan did well with stenting of the right coronary  artery.  He needs aggressive medical therapy.  He has had significant  plaque progression since his catheterization in 2007.  He continues to  smoke cigarettes and smoking cessation counseling will be provided.  Fortunately, he has been on aspirin and Plavix over the last 2 years and  should tolerate this in combination without difficulty.      Veverly Fells. Excell Seltzer, MD  Electronically Signed     MDC/MEDQ  D:  02/26/2008  T:  02/26/2008  Job:  [id number]

## 2010-09-15 NOTE — H&P (Signed)
NAME:  [name] Graham, [name] Graham NO.:  [id number]   MEDICAL RECORD NO.:  [id number]          PATIENT TYPE:  INP   LOCATION:  2905                         FACILITY:  [facility]   PHYSICIAN:  Glennie Isle, MD   DATE OF BIRTH:  April 07, 1936   DATE OF ADMISSION:  02/25/2008  DATE OF DISCHARGE:                              HISTORY & PHYSICAL   PRIMARY CARDIOLOGIST:  Jonelle Sidle, MD   CHIEF COMPLAINT:  Chest pain.   HISTORY OF PRESENT ILLNESS:  The patient is a 75 year old gentleman with  a history of a non-STEMI in November of 2007, status post bare-mental  stent to the left circumflex and Poba to the right PDA who currently  presents with chest pain since 10 a.m. relieved with nitroglycerin.  He  states the chest pain is similar to pain that he had in 2007, but less  severe.  He denies any palpitations, lower extremity edema, PND or  orthopnea.  He has been doing well since 2007 until yesterday.  The  patient says he is compliant with medications.  His troponin was  elevated at an outside hospital at 0.22.   PAST MEDICAL HISTORY:  1. Coronary artery disease, status post non-ST elevation MI in      November 2007.  Left heart catheterization at that time showed a      99% left circumflex and a 95% PDA and a status post bare-mental      stent to the circumflex and Poba to the PDA.  2. Tobacco.  3. Hypertension.  4. Hyperlipidemia.  5. Chronic obstructive pulmonary disease.  6. Gastroesophageal reflux disease.   ALLERGIES:  No known drug allergies.   MEDICATIONS:  1. Aspirin 81 mg daily.  2. Plavix 75 mg daily.  3. Toprol XL 25 mg daily; however, the patient is taking Metoprolol 25      mg once a day.  4. Lisinopril 20 mg daily.  5. Zocor 80 mg daily.  6. Nitroglycerin p.r.n.   SOCIAL HISTORY:  The patient denies any IV drug use.  Currently smokes  one pack per day.  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   FAMILY HISTORY:  Mother died of an MI at age 50.   Father died of throat  cancer at age 70.   REVIEW OF SYSTEMS:  A thorough 14-point review of systems was performed  and was negative except as noted per HPI.   PHYSICAL EXAMINATION:  Temperature of 97, pulse of 56, blood pressure  155/79, sating at 98% on room air.  GENERAL APPEARANCE:  In no apparent distress.  HEENT:  Pupils equal and round, reactive to light.  Extraocular  movements are intact.  Oropharynx is clear.  NECK:  Supple, no thyromegaly appreciated.  No JVD.  CARDIOVASCULAR EXAM:  S1 and S2 are normal.  No murmurs or rubs are  heard.  There is no carotid bruit heard and no JVD.  LUNGS:  Clear to auscultation bilaterally.  No crackles are heard.  ABDOMEN:  Soft, nontender, nondistended.  EXTREMITIES:  No clubbing, cyanosis or edema noted.  The patient has 2+  pulses in posterior tibial and dorsalis pedis.  MUSCULOSKELETAL:  No joint deformities are noted, no effusions.  NEUROLOGIC EXAM:  Alert and oriented x3.  Cranial nerves II-XII are  intact.  Sensation and motor is grossly intact.   EKG:  Normal sinus rhythm.  No ST changes or T-wave inversions from the  EKG at the outside hospital.   LABS:  White count of 7.5 with hemoglobin of 14.4, platelets of 200.  BMP:  BUN and creatinine of 14/1.2, potassium 3.7, troponin at the  outside hospital was elevated at 0.22, MB less than 1.   ASSESSMENT/PLAN:  1. Non-ST elevation myocardial infarction:  We will continue aspirin,      Plavix, heparin drip, nitroglycerin and statin.  We will decrease      the beta-blocker to 6.25 mg q. 8 hours given that the heart rate is      in the 50s.  Plan for a catheterization on Monday.  The patient is      currently chest pain-free.  However, we will continue to monitor      his enzymes to see if there is upward trending.  2. Hypertension: We will continue his outpatient medications.  We will      increase lisinopril to 20 mg daily.  3. Tobacco:  Encourage smoking cessation.       Glennie Isle, MD  Electronically Signed     SS/MEDQ  D:  02/25/2008  T:  02/25/2008  Job:  [id number]

## 2010-09-15 NOTE — Assessment & Plan Note (Signed)
[facility]                       [place] CARDIOLOGY OFFICE NOTE   DALLYN, BERGLAND                    MRN:          [id number]  DATE:05/02/2007                            DOB:          05-19-1935    Mr. Osoria returns today for followup.  He has known coronary artery  disease with history of angioplasty of the PDA and stenting of the circ  in 2007.  He has been doing well.  He has noted significant chest pain.  His blood pressure seems to be running high.  He has tried to avoid  salt.  I told him I thought we needed to add medication to his regimen.   He also needs a fasting lipid profile today.  He is on the high-dose  simvastatin.  The patient is compliant with his other medications.  In  particularly, he has been taking his aspirin and Plavix.  He has had  significant pain in the right leg.  He had a long ride to [place] and  fell.  There is some focal tenderness behind the left knee.  I told him  we would get an ultrasound today to rule out DVT.   REVIEW OF SYSTEMS:  His review of systems otherwise is unremarkable  other than the right-sided leg pain.   MEDICATIONS:  1. Aspirin a day.  2. Plavix 75 a day.  3. Simvastatin 80 a day.  4. Metoprolol 25 a day.  5. Lisinopril 10 mg a day to be added.   PHYSICAL EXAMINATION:  GENERAL:  A thin, elderly white male in no  distress.  VITAL SIGNS:  Weight is 167.  Blood pressure is 160/90, pulse 62 and  regular, respiratory rate 16, afebrile.  HEENT:  Unremarkable.  NECK:  Carotids normal without bruit.  No lymphadenopathy, thyromegaly,  or JVP elevation.  No bruits.  LUNGS:  Clear.  Good diaphragmatic motion.  No wheezing.  CARDIAC:  S1-S2.  Normal heart sounds.  PMI normal.  ABDOMEN:  Benign.  Bowel sounds positive.  No AAA.  No bruit.  No  hepatosplenomegaly or hepatojugular reflexes.  EXTREMITIES:  Distal pulses are intact.  No edema.  He has some focal  tenderness behind the left  knee.  Hamstring does not appear to be tight.  There is no obvious sciatic or neurological deficit in the right leg.  PTs are +3 bilaterally.  SKIN:  Warm and dry.   IMPRESSION:  1. Coronary disease, previous stenting of the circumflex and      percutaneous coronary intervention of the patent ductus arteriosus.      Followup Myoview in 6 months.  Continue aspirin and beta blocker.  2. Hypercholesterolemia.  Check lipid and liver profile today.      Continue simvastatin 80 a day.  3. Hypertension.  Add lisinopril 10 mg a day.  Low salt diet.      Followup in 6-8 weeks.  4. Right leg pain in the setting of recent trauma and long car ride.      Check lower extremity duplex on the right side.  Analgesics  as      needed.  Noralyn Pick. Eden Emms, MD, [facility]  Electronically Signed    PCN/MedQ  DD: 05/02/2007  DT: 05/02/2007  Job #: [id number]

## 2010-09-15 NOTE — Assessment & Plan Note (Signed)
[facility]                       [place] CARDIOLOGY OFFICE NOTE   RENTON, BERKLEY                    MRN:          [id number]  DATE:01/15/2008                            DOB:          12-01-1935    PRIMARY CARE PHYSICIAN:  None.   REASON FOR VISIT:  Routine cardiac followup.   HISTORY OF PRESENT ILLNESS:  This is my first meeting with Mr.  Maynez.  He is a pleasant gentleman previously followed by Dr. Eden Emms  and last seen in the office back in February.  He has a history of  cardiovascular disease status post presentation with a non-ST-elevation  myocardial infarction back in November 2007.  He underwent cardiac  catheterization, which revealed a 99% stenosis within the circumflex  coronary artery and a 95% stenosis in the proximal posterior descending  branch of the right coronary artery.  Ejection fraction was 50% at that  time.  He underwent bare-metal stent placement to the circumflex and  angioplasty of the posterior descending branch.  Since that time, he has  done fairly well with rare angina.  He stated that he has only used a  total of 4 nitroglycerin tablets since this event.  He reports  compliance with his medications.  Despite quitting tobacco use several  years ago for a total of 3 years, he has not been able to stop smoking  again.  We talked about this today.  His lipids have been in fairly good  order, however, on high-dose statin therapy with an LDL of 73 last year.  He continues to work at a [employer] and denies any reproducible  exertional chest pain.   PRESENT MEDICATIONS:  1. Aspirin 81 mg p.o. daily.  2. Plavix 75 mg p.o. daily.  3. Simvastatin 80 mg p.o. nightly.  4. Toprol-XL 25 mg p.o. daily.  5. Lisinopril 10 mg p.o. daily.  6. Sublingual nitroglycerin 0.4 mg p.r.n.   REVIEW OF SYSTEMS:  As described in the history of present illness.  No  claudication.  No palpitations or syncope.  Otherwise  negative.   PHYSICAL EXAMINATION:  VITAL SIGNS:  Blood pressure 140/88, heart rate  72, and weight 162 pounds.  GENERAL:  The patient is comfortable and in no acute distress.  HEENT:  Conjunctiva is normal.  Oropharynx clear.  NECK:  Supple.  No elevated jugular venous pressure.  No loud bruits.  No thyromegaly is noted.  LUNGS:  Clear with diminished breath sounds.  No active wheezing or  labored breathing.  CARDIAC:  Regular rate and rhythm.  No loud systolic murmur or S3  gallop.  No pericardial rub.  ABDOMEN:  Soft.  No midline bruit or tenderness.  No hepatomegaly.  Bowel sounds present.  EXTREMITIES:  Exhibit no significant pitting edema.  Distal pulses are  2+.  SKIN:  Warm and dry.  MUSCULOSKELETAL:  No kyphosis noted.  NEUROPSYCHIATRIC:  The patient alert and oriented x3.  Affect is  appropriate.   IMPRESSION AND RECOMMENDATIONS:  Coronary artery disease as outlined  above status post bare-metal stent placed into the circumflex and  angioplasty to the posterior  descending branch of the right coronary  artery back in 2007.  Mr. Troop has been symptomatically stable.  We  will plan to continue present medical therapy with the exception of  increasing lisinopril to 20 mg daily in an effort to obtain better blood  pressure control.  This change will be followed by a BMET in 1 week's  time as well as fasting lipids and liver function test to reassess lipid  control on high-dose simvastatin.  We did talk about smoking cessation  today.  Typical planned followup will be over the next 6 months at which  time we will follow up with a Myoview.     Jonelle Sidle, MD  Electronically Signed    SGM/MedQ  DD: 01/15/2008  DT: 01/15/2008  Job #: [id number]

## 2010-09-15 NOTE — Assessment & Plan Note (Signed)
[facility]                       [place] CARDIOLOGY OFFICE NOTE   [name], Graham                    MRN:          [id number]  DATE:06/28/2007                            DOB:          May 23, 1935    [name] Graham returns today for followup.   He has a history of coronary artery disease with previous angioplasty of  the PDA and stenting of the circ in 2007. He is doing well. He is not  having significant chest pain.  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 He is not getting  any substernal chest pain, palpitation, PND, orthopnea.  The last time I  saw him he had some trauma to the right lower extremity.  This is  improved.  His venous duplex was negative.   His review of systems is otherwise negative.   MEDICATIONS:  1. Aspirin a day.  2. Plavix 75 a day.  3. Simvastatin 80 a day.  4. Metoprolol 25 a day.  5. Lisinopril 10 a day. This was added during his last visit for      hypertension.   PHYSICAL EXAMINATION:  Is remarkable for a thin elderly white male in no  distress.  Blood pressure is better at 140/88, pulse 60 and regular,  afebrile, respiratory rate 14.  HEENT:  Unremarkable.  Carotids are normal without bruit.  No  lymphadenopathy, thyromegaly, JVP elevation.  LUNGS:  Clear with good  diaphragmatic motion.  No wheezing.  S1-S2 with normal heart sounds.  PMI normal.  ABDOMEN:  Benign.  No renal bruits.  No AAA; no tenderness.  No  hepatosplenomegaly. No hepatojugular reflux.  Distal pulses are intact. No edema.  NEURO: Nonfocal.  SKIN:  Warm and dry.  No muscular weakness.   IMPRESSION:  1. Coronary disease with no chest pain.  Continue aspirin, Plavix and      beta-blocker. Followup Myoview in the year.  2. Hypercholesterolemia.  Last LDL cholesterol 73.  Continue high-dose      statin. LFTs normal.      Follow-up in 6 months.  3. Hypertension, improved control.  Continue low salt diet and      lisinopril 10 mg a day.   Overall,  [name] Graham is doing well and I will seem back in 18-month.     Noralyn Pick. Eden Emms, MD, [facility]  Electronically Signed    PCN/MedQ  DD: 06/28/2007  DT: 06/28/2007  Job #: [id number]

## 2010-09-15 NOTE — Discharge Summary (Signed)
NAME:  [name] Graham, SUR NO.:  [id number]   MEDICAL RECORD NO.:  [id number]          PATIENT TYPE:  INP   LOCATION:  6527                         FACILITY:  [facility]   PHYSICIAN:  Veverly Fells. Excell Seltzer, MD  DATE OF BIRTH:  1936/01/28   DATE OF ADMISSION:  02/25/2008  DATE OF DISCHARGE:  02/27/2008                               DISCHARGE SUMMARY   PRIMARY CARDIOLOGIST:  [name] Sidle, MD   DISCHARGING PHYSICIAN:  Veverly Fells. Excell Seltzer, MD   DISCHARGE DIAGNOSES:  1. Unstable angina/coronary artery disease status post percutaneous      coronary intervention of the right coronary artery with overlapping      drug-eluting stents, also a participant in the ADAPT drug-eluting      stent study.  2. Nonsustained ventricular tachycardia, well-preserved ventricular      ejection fraction.  3. Tobacco abuse.  4  Hypertension.  1. Hyperlipidemia.  2. Chronic obstructive pulmonary disease.  3. Gastroesophageal reflux disease.   PROCEDURES THIS ADMISSION:  Cardiac catheterization on February 26, 2008,  by Dr. Tonny Bollman.   CONSULTS:  Cardiac rehab and nutritional assessment.   HOSPITAL COURSE:  [name] Graham is a 75 year old Caucasian gentleman  with a history of non-ST-elevated MI in November 2007, status post bare-  metal stent to the left circumflex and Promus to the right PDA, who  presented with chest pain on day of admission, relieved with  nitroglycerin.  Troponin elevated at 0.22 at an outside hospital.  The  patient admitted for non-ST-elevated MI.  Continued aspirin, Plavix,  heparin drip, nitroglycerin, statin, beta-blocker was decreased to 6.25  mg q.8 h secondary to heart rate in the 50s.  Lisinopril was increased  to 20 mg daily.  The patient remained stable over the weekend, to the  cath lab on Monday, February 26, 2008, results as stated above.  The  patient had tobacco cessation education.  Noted can be a run of  nonsustained V tach, asymptomatic; other  episodes, the patient had sinus  brady with a heart rate less than 55.  [name] Graham in to see the patient  on day of discharge, patient stable, no episodes of chest discomfort,  heart rate in the 60s, blood pressure 135/58, afebrile, and cath site  stable.  The patient being discharged home to follow up with Dr.  Diona Browner in [place].  I have scheduled him for an appointment in  March 11, 2008, at 1:15.  He has also been given the post cardiac  catheterization discharge instructions.   MEDICATIONS AT TIME OF DISCHARGE:  1. Plavix 75 mg daily.  2. Metoprolol 25 mg daily.  3. Lisinopril 20 daily.  4. Simvastatin 80 daily.  5. Aspirin 325.  6. Nitroglycerin as needed.  He has also been given a prescription for Chantix therapy.   Discharge encounter greater than 30 minutes.      [name] Graham, ACNP      Veverly Fells. Excell Seltzer, MD  Electronically Signed    MB/MEDQ  D:  02/27/2008  T:  02/28/2008  Job:  [id number]

## 2010-09-15 NOTE — Assessment & Plan Note (Signed)
[facility] HEALTHCARE                       [place] CARDIOLOGY OFFICE NOTE   Graham, [name]                    MRN:          [id number]  DATE:07/26/2008                            DOB:          04/23/1936    CARDIOLOGIST:  [name] Sidle, MD   PRIMARY CARE PHYSICIAN:  [name] Penna, MD   REASON FOR VISIT:  Three-month followup.   HISTORY OF PRESENT ILLNESS:  [name] Graham is a 75 year old male patient  with a history of coronary artery disease status post prior bare metal  stent to the circumflex and angioplasty of the PDA in November 2007 in  the setting of NSTEMI.  His last intervention was in October 2009, when  he presented with acute coronary syndrome.  At that time, he underwent  placement of an endeavor drug-eluting stent x2 to the RCA.  His  circumflex was patent and he had moderate LAD stenosis that was treated  medically.  His LV function is well preserved.  The patient presents for  followup today.  He denies any chest discomfort or shortness of breath.  He denies any orthopnea, PND, or pedal edema.  Denies any syncope or  near syncope.   MEDICATIONS:  Plavix 75 mg daily, simvastatin 80 mg nightly, metoprolol  25 mg daily, lisinopril 20 mg daily, aspirin 81 mg b.i.d., nitroglycerin  p.r.n.   REVIEW OF SYSTEMS:  Please see HPI.  He is describing about 5-7 days of  pharyngitis.  He denies fevers or chills.  Denies any abdominal pain or  rash.  He has had some sick contacts at home.   PHYSICAL EXAMINATION:  GENERAL:  He is a well-nourished, well-developed  male in no acute distress.  VITAL SIGNS:  Blood pressure is 130/90, pulse 76, weight 160 pounds.  HEENT:  Head, normocephalic, atraumatic.  Eyes, PERRLA, EOMI.  Sclerae  are clear.  Oropharynx is somewhat erythematous; however, without  exudate.  Tonsils are 0 to 1+ bilaterally.  Nose without exudate.  LYMPH:  Without lymphadenopathy.  NECK:  Supple.  CARDIAC:  Normal S1 and  S2.  Regular rate and rhythm.  LUNGS:  Clear to auscultation bilaterally.  ABDOMEN:  Soft, nontender.  EXTREMITIES:  Without edema.  NEUROLOGIC:  He is alert and oriented x3.  Cranial nerves II through XII  grossly intact.  SKIN:  Warm and dry.   ASSESSMENT/PLAN:  1. Coronary disease status post prior percutaneous coronary      intervention to the circumflex and PDA with recent drug-eluting      stent placement x2 to the right coronary artery in the setting of      acute coronary syndrome in October 2009, with overall preserved      left ventricular function.  He is doing well without anginal      symptoms.  He will continue on aspirin, Plavix for now.  2. Pharyngitis.  I think this is likely viral pharyngitis.  I will      send him down to the lab for a rapid strep test to rule out the      possibility of strep pharyngitis.  I have  recommended over-the-      counter ibuprofen p.r.n., Claritin/Benadryl p.r.n. and salt water      gargles.  3. Dyslipidemia.  He will continue on simvastatin.  His recent lipid      panel in October was optimal.  4. Tobacco abuse.  I counseled him on tobacco cessation today.  He is      quite concerned about throat cancer as his father died from this.      He would really like to get screened and I have asked him to      discuss this further with [name] Graham.   DISPOSITION:  He can followup with Dr. Diona Graham in 6 months or sooner  p.r.n.      Tereso Newcomer, PA-C  Electronically Signed      [name] Graham. [name] Pates, MD, [facility]  Electronically Signed   SW/MedQ  DD: 07/26/2008  DT: 07/27/2008  Job #: [id number]   cc:   [name] Graham, M.D.

## 2010-09-15 NOTE — Assessment & Plan Note (Signed)
[facility] HEALTHCARE                       [place] CARDIOLOGY OFFICE NOTE   [name] Graham, [name] Graham                    MRN:          [id number]  DATE:03/11/2008                            DOB:          [birth year]    REASON FOR VISIT:  Post hospital followup.   HISTORY OF PRESENT ILLNESS:  I saw Mr. Choung back in September as  detailed in the previous note.  At that time, he was stable on medical  therapy.  We made some medication adjustments at that time and planned  to follow up blood work, also discussing smoking cessation.  In the  meanwhile, he presented in October with unstable angina and was admitted  ultimately to [facility].  He manifested mild elevation in  troponin I level to 0.22 and was treated aggressively with subsequent  cardiac catheterization by Dr. Excell Seltzer demonstrating severe right  coronary artery stenosis that was treated with percutaneous intervention  with overlapping drug-eluting stents.  His previously placed stent in  the left circumflex was widely patent, and he had moderate disease  within the left anterior descending that was managed medically.  Left  ventricular systolic function was 55% with no significant mitral  regurgitation.  He tolerated this procedure well.   He has had followup lipids showing LDL control at 66, HDL 51,  triglycerides 67, and total cholesterol 130.  He is compliant with his  medications and states that he has stopped smoking completely since his  presentation in October.  He has been taking Chantix.  He is not  reporting any significant chest pain and plans to start cardiac  rehabilitation soon.  His followup electromyogram today shows sinus  bradycardia at 53 beats per minute.   Medications include:  1. Aspirin 325 mg p.o. daily.  2. Plavix 75 mg p.o. daily.  3. Simvastatin 80 mg p.o. nightly.  4. Metoprolol 25 mg p.o. b.i.d.  5. Lisinopril 20 mg p.o. daily.  6. Sublingual  nitroglycerin 0.4 mg p.r.n.  7. Chantix.   REVIEW OF SYSTEMS:  As described in the history of present illness.  He  has had no problems with his right groin site.  He is status post  catheterization.  Otherwise, negative.   PHYSICAL EXAMINATION:  VITAL SIGNS:  Blood pressure is 112/78, heart  rate is 53, weight 164 pounds.  GENERAL:  The patient is comfortable in no acute distress.  NECK:  No elevated jugular venous pressure.  No loud bruits.  No  thyromegaly.  LUNGS:  Clear with diminished breath sounds without labored breathing.  CARDIAC:  A regular rate and rhythm.  No S3 gallop or loud systolic  murmur.  ABDOMEN:  Right groin site is stable with a small resolving area of  ecchymosis.  No bruit or unusual pulsatility.  Distal pulses are 2+.  SKIN:  Warm and dry.  MUSCULOSKELETAL:  No kyphosis noted.   IMPRESSION AND RECOMMENDATIONS:  Cardiovascular disease status post  previous and bare-metal stent placement to the circumflex with  angioplasty of the posterior descending branch of the right coronary  artery in 2007.  More recently, Mr. Mccollum is status  post  presentation with unstable angina/non-ST-elevation myocardial infarction  with findings of severe right coronary artery stenosis requiring  placement of 2 overlapping drug-eluting stents.  He is doing well at  this point clinically and has stopped smoking since his presentation.  I  plan to continue his present medications.  I encouraged him to continue  on with cardiac rehabilitation.  His lipids look to be in good order on  present dose of statin medication.  I will plan to see him back  clinically over the next 3-4 months.     Jonelle Sidle, MD  Electronically Signed    SGM/MedQ  DD: 03/11/2008  DT: 03/12/2008  Job #: [id number]

## 2010-09-16 ENCOUNTER — Encounter (HOSPITAL_COMMUNITY): Payer: Medicare Other

## 2010-09-18 ENCOUNTER — Encounter (HOSPITAL_COMMUNITY): Payer: Medicare Other

## 2010-09-18 NOTE — Assessment & Plan Note (Signed)
[facility] HEALTHCARE                       [place] CARDIOLOGY OFFICE NOTE   DAMARRION, MIMBS                    MRN:          [id number]  DATE:08/02/2006                            DOB:          Sep 20, 1935    Gaje returns today for followup. He has history of angioplasty of the  PDA and stenting of the circumflex in November of 2007.  He had an  episode of chest pain at cardiac rehab in February. We treated him  medically and this has not recurred. He is doing well. He actually  continues to workout with his 2 sons in their shop, using the elliptical  and treadmill. He has not had to use any nitroglycerine. At cardiac  rehab his heart rate and blood pressures were fine on just a beta  blocker.   CURRENT MEDICATIONS:  1. An aspirin a day.  2. Plavix 75 a day.  3. Simvastatin 80 a day.  4. Lopressor 25 a day.   Today in the office his blood pressure is 130/80, pulse 68 and regular.  HEENT:  Normal. Carotids normal without bruits.  LUNGS: Clear. There is a soft systolic ejection murmur.  ABDOMEN: Benign.  LOWER EXTREMITIES: Intact pulses. No edema.  NEURO: Nonfocal.  SKIN: Warm and dry.   IMPRESSION:  History of coronary artery disease, isolated episode of  chest pain, now finished with cardiac rehabilitation without any  recurrence, continue medical therapy. No indication for catheterization.  He will have follow up lipid and liver profile in 6 months. I will see  back in 3 months. He has fresh nitroglycerine at home in case he needs  it. I encouraged him to continue to workout aerobically at home.   If he has any concerns prior to his 3 month visit he will call us.     Noralyn Pick. Eden Emms, MD, [facility]  Electronically Signed    PCN/MedQ  DD: 08/02/2006  DT: 08/02/2006  Job #: [id number]

## 2010-09-18 NOTE — H&P (Signed)
NAME:  [name] Graham, BLIZARD NO.:  [id number]   MEDICAL RECORD NO.:  [id number]          PATIENT TYPE:  INP   LOCATION:  2907                         FACILITY:  [facility]   PHYSICIAN:  Bevelyn Buckles. Bensimhon, MDDATE OF BIRTH:  1935-09-27   DATE OF ADMISSION:  03/08/2006  DATE OF DISCHARGE:                                HISTORY & PHYSICAL   PRIMARY CARE PHYSICIAN.:  None.   REASON FOR ADMISSION:  Non-ST elevation MI.   HISTORY OF PRESENT ILLNESS:  [name] Graham is a very pleasant 75 year old  male with a history of gastroesophageal reflux disease, hiatal hernia and  COPD with ongoing tobacco use.  He denies any known cardiac history.  He was  in his usual state of health until Sunday night when he woke up from sleep  with severe chest pain at midnight.  This was radiating down his right arm.  The pain lasted 5 hours and resolved.  He did not seek medical attention at  that time.  Today he had recurrent chest pain while feeding his dog at 4  o'clock.  This was unrelieved with antacids.  The pain persisted for about 2  hours and he finally decided to go to the [facility] emergency room.  Initial EKG showed no acute changes.  Systolic blood pressure was markedly  elevated at 184/108.  He was given nitroglycerin, which resolved his pain.  Point of care troponin came back at 0.89 initially and then 1.27.  He was  started on aspirin, heparin, Integrilin and IV nitroglycerin.  He is now is  transferred for further management.  He now pain-free.   REVIEW OF SYSTEMS:  He denies any CHF, claudication, melena or bright red  blood per rectum.  He does have some arthritis pain.  The remainder of his  review of systems is negative except for HPI and problem list.   PROBLEM LIST:  1. Chronic obstructive pulmonary disease with ongoing tobacco use.  2. Gastroesophageal reflux disease with hiatal hernia.   MEDICATIONS:  Zantac p.r.n.   ALLERGIES:  None.   SOCIAL HISTORY:  He lives in  [place].  He is divorced.  He is retired a  Emergency planning/management officer.  Tobacco:  One to two packs a day x50 years.  Alcohol:  None.  Drugs:  None.   FAMILY HISTORY:  Father died at 21 due to throat cancer.  Mother died at 28  due to myocardial infarction.  She also had Alzheimer's.  He has two half-  sisters with no known coronary disease.   PHYSICAL EXAMINATION:  GENERAL:  He is sitting in bed in no acute distress.  Respirations are unlabored.  A blood pressure is 154/94 with mean of 109.  His heart rate is 67.  He is saturating 99% on 2 L O2.  HEENT: Sclerae anicteric.  EOMI.  Thee are no xanthelasmas.  Mucous  membranes are moist.  NECK:  Supple.  JVP is mildly elevated at 8 cmH2O with CV waves.  Carotids  are 2+ bilaterally without bruits.  There is no lymphadenopathy or  thyromegaly.  CARDIAC:  He has distant  heart sounds with a regular rate and rhythm.  No  obvious murmurs, rubs or gallops.  LUNGS:  Clear with mildly decreased breath sounds.  ABDOMEN:  Soft, nontender, nondistended.  No hepatosplenomegaly, no bruits.  No masses.  Abdominal aortic impulse does not appear widened.  There are  good bowel sounds.  EXTREMITIES:  Warm with no cyanosis, clubbing or edema.  Femoral pulses are  2+ bilaterally without any bruits.  DP pulses are 2+ bilaterally.  There are  no rashes or arthropathies  NEUROLOGIC:  He is alert and oriented x3.  Cranial nerves II-XII are intact.  He moves all for extremities without difficulty.  Affect is pleasant.   Labs show a white count of 9, hemoglobin 16.8, platelets of 228.  Sodium  138, potassium 4.2, chloride 103, bicarb 27, BUN of 12, creatinine 1.0,  glucose of 118.  Initial MB was 6.8 with a troponin of 0.89.  Follow-up MB  was 7.7 with a troponin of 1.27.  EKG shows normal sinus rhythm at a rate of  66 with a left anterior fascicular block.  No acute ST-T wave changes.  Chest x-ray shows COPD.   ASSESSMENT:  1. Non-ST elevation myocardial  infarction.  2. Chronic obstructive pulmonary disease ongoing tobacco use.  3. Hypertension, undiagnosed.   PLAN/DISCUSSION:  He is admitted to the ICU.  We will treat with heparin,  Integrilin, aspirin, beta-blocker, statin and nitroglycerin.  He was he will  need a cardiac catheterization in the a.m.  I have explained the risks and  benefits to him and he agrees to proceed.  He will also need a smoking  cessation consult.      Bevelyn Buckles. Bensimhon, MD  Electronically Signed     DRB/MEDQ  D:  03/08/2006  T:  03/09/2006  Job:  [id number]

## 2010-09-18 NOTE — Discharge Summary (Signed)
NAME:  [name] Graham, [name] Graham NO.:  [id number]   MEDICAL RECORD NO.:  [id number]          PATIENT TYPE:  INP   LOCATION:  2907                         FACILITY:  [facility]   PHYSICIAN:  Veverly Fells. Excell Seltzer, MD  DATE OF BIRTH:  1935-12-25   DATE OF ADMISSION:  03/08/2006  DATE OF DISCHARGE:  03/10/2006                           DISCHARGE SUMMARY - REFERRING   The patient does not have a primary care physician.   DISCHARGE DIAGNOSES:  1. Non-ST elevated myocardial infarction.  2. Hyperlipidemia.  3. Tobacco use with chronic obstructive pulmonary disease.  4. Hypertension.   PROCEDURES PERFORMED:  Which include:  March 09, 2006, cardiac  catheterization with Dr. Excell Seltzer with bare-metal stenting to the left  circumflex and PTCA to the PDA.   BRIEF HISTORY:  [name] Graham is a 75 year old white male who on the evening  prior to admission developed severe chest discomfort radiating to his right  arm, lasting approximately 5 hours and then resolved.  On the day of  admission, he had reoccurring chest discomfort, unrelieved with antacids.  He presented to [facility] emergency room.  An EKG did not show any changes.  However, he was hypertensive at 184/108.  His discomfort was relieved with  nitroglycerin.  He ruled in for myocardial infarction.  Thus, he was  transferred to [facility] for further evaluation.   PAST MEDICAL HISTORY:  Is notable for;  1. GERD.  2. Probable COPD with continued tobacco use   LABORATORY DATA:  Admission H&H of 16.8 and 49.5, normal indices, platelets  228, WBCs 9.1.  Prior to discharge, H&H was 14.5 and 42.0, normal indices,  platelets 174, WBCs 8.1.  PT 14.3, INR 1.00.  Admission sodium 138,  potassium 42, BUN 12, creatinine 1.0, glucose 118, normal LFTs.  On the 8th  prior to discharge, sodium was 139, potassium 3.8, BUN 9, creatinine 0.8,  glucose 90.  Hemoglobin A1c was 5.8.  Initial CK-MB was 153 and 5.6 with an  elevated relative index of  3.7 and a troponin of 1.27.  Subsequent CK-MB on  the 7th was 233 and 12.7 and a relative index 5.5.  At the time of this  dictation, third CK-MB and troponin are pending.  Fasting cholesterol was  176, triglycerides 137, HDL 49, LDL 100.  TSH 4.832.  Chest x-ray shows  evidence of COPD.  EKGs showed a normal sinus rhythm, left axis deviation,  early repolarization.  Subsequent EKGs were essentially the same.   HOSPITAL COURSE:  [name] Graham was transferred to [facility] from  [facility].  He was placed on IV heparin and Integrilin per  pharmacy.  Catheterization was performed on March 09, 2006 by Dr. Excell Seltzer.  This revealed nonobstructive plaquing in the LAD, 99% mid circumflex, 40%  proximal, 50% mid RCA and a 95% proximal PDA lesion; EF was 50% with lateral  wall hypokinesis.  Bare-metal stenting was placed to the circumflex reducing  this from 99% to 0% and PTCA of the PDA was performed reducing this from 95%  to 0%.  He was maintained on Integrilin for least 12  hours and loaded with  Plavix.  Starclose of the RFA was failed; pressure was used for hemostasis.  Post bedrest, the patient was ambulating without difficulty.  He received  education and assistance with ambulation by cardiac rehab.  Tobacco  cessation consult was performed, and the patient states that he plans to  quit on his own.  Dr. Excell Seltzer states he would like patient to be on Plavix  for at least 1 year, 30 days at a minimal; aspirin indefinitely.  On  March 10, 2006, case management assisted with discharge needs.  Plavix  assistance card and form were filled out.  Dr. Excell Seltzer felt that he needs to  stay on Plavix at least 30 days minimum, 1 year if at all possible.  Third  set of CK-MBs and second troponin is pending at the time of dictation.   DISPOSITION:  He is asked to maintain a low salt and fat cholesterol diet.  His activities are restricted in regards to 2 weeks in regards to lifting,   driving, sexual activity or heavy exertion.  Wound care as per supplemental  discharge sheet.  He is asked bring all medications to all appointments.   He received new prescriptions for Plavix 75 mg q.d., in addition to Plavix  card for 14-day supply.  1. Zocor 40 mg q.h.s.  2. Nitroglycerin 0.4 as needed.  3. Metoprolol 25 mg b.i.d.  4. Zantac or Pepcid as previously taking.  5. Nitroglycerin 0.4 as needed.  6. Aspirin 325 q.d.   He will follow up with Dr. Eden Emms in the [place] office on March 21, 2006 at 1:00 p.m. The patient prefers to be followed up in [place].  He  is advised no smoking or tobacco products.  He was asked to obtain a primary  care physician prior to his appointment with Dr. Eden Emms.  He will need blood  work in approximately 6-8 weeks for LFTs and FLPs since Zocor was initiated.   Discharge time greater than 30 minutes.      [name], PA-C      Veverly Fells. Excell Seltzer, MD  Electronically Signed    EW/MEDQ  D:  03/10/2006  T:  03/10/2006  Job:  [id number]   cc:   [facility]  Noralyn Pick. Eden Emms, MD, [facility]

## 2010-09-18 NOTE — Assessment & Plan Note (Signed)
[facility] HEALTHCARE                         [place] CARDIOLOGY OFFICE NOTE   JAYMISON, LUBER                    MRN:          [id number]  DATE:03/21/2006                            DOB:          [birth year]    Mr. Goldwire returns today for follow up. He was in the hospital recently  for a subendocardial MI. He had stenting of a PDA and an OM branch.   He had drug eluting stents. Unfortunately the cost of his Plavix is quite  substantial. He got a 14-day sample supply but continues to pay about $160  dollars a month for his Plavix. We will try to get him some assistance with  this. Since discharge he has not had any significant chest pain. He is on  simvastatin 80 mg a day and Lopressor 25 b.i.d.   REVIEW OF SYSTEMS:  He has not had any exertional angina, shortness of  breath, PND or orthopnea, syncope or lower extremity edema.   MEDICATIONS:  He is on an aspirin a day, Plavix 75 mg a day, simvastatin 80  a day and Lopressor 25 b.i.d.   The patient is retired. He is fairly active around the house however and  also outside.   PHYSICAL EXAMINATION:  VITAL SIGNS: The blood pressure is 148/88, pulse is  52 and regular.  HEENT: Normal.  NECK: Carotids have no bruits. There is no lymphadenopathy, no thyromegaly.  LUNGS: Clear.  CARDIAC: Normal S1, S2, normal heart sounds.  ABDOMEN: Benign.  LOWER EXTREMITIES: Intact pulses, no edema.   IMPRESSION:  Stable 2-vessel coronary disease, stenting of OM and PDA.   Continue aspirin and Plavix indefinitely.   The patient will have a follow up Myoview in 6 months.   His EKG today was normal. We will have to watch his heart rate as he is  somewhat bradycardic but currently asymptomatic. We can always cut back on  his beta blocker. He will have a lipid panel and LFTs in about 3 months.   Overall I am pleased with his progress. He does have nitroglycerin tablets  to take as needed. Since his  angioplasty he has cut back his smoking from  1-1/2 packs a day to approximately 5 cigarettes every week. I encouraged him  to continue to stop totally.   He does not want to entertain the idea of Chantix at this time.     Noralyn Pick. Eden Emms, MD, [facility]  Electronically Signed    PCN/MedQ  DD: 03/21/2006  DT: 03/21/2006  Job #: [id number]

## 2010-09-18 NOTE — Assessment & Plan Note (Signed)
[facility] HEALTHCARE                        CARDIOLOGY OFFICE NOTE   [name], [name]                    MRN:          [id number]  DATE:06/13/2006                            DOB:          1935/10/28    [name] Graham returns today for followup.  He had a non-ST-elevation MI  November 6.  He was treated with stenting of the circumflex and PDA.   The patient has done well since discharge.  He has had one episode since  chest pain which he thought was indigestion.  He took one nitroglycerin  for it and it went away.  He thinks it was from spaghetti that he ate.  Outside of that, he has been compliant with his medications.  He has  been active.  He is enjoying cardiac rehab.   The patient's review of systems is remarkable for no significant  palpitations, PND, orthopnea.   Medications include:  1. An aspirin a day.  2. Plavix 75 a day.  3. Simvastatin 80 a day.  4. Lopressor 25 b.i.d.   His heart rate and blood pressure have been fairly good at cardiac  rehab.  His resting heart rate is low, in the 50-55 range, but he is  asymptomatic.   The patient's exam is remarkable for a blood pressure of 120/80, pulse  is 55 and regular.  HEENT is normal.  There are no carotid bruits, no  JVP elevation.  Lungs are clear.  There is S1, S2 with normal heart  tones.  Abdomen is benign.  Lower extremities with intact pulses, no  edema.   The patient had mild LV dysfunction by ventriculogram.   IMPRESSION:  Coronary disease status post non-ST-elevation myocardial  infarction secondary to circumflex obtuse marginal disease, status post  percutaneous transluminal coronary angioplasty and stenting here and  also at the crux near the takeoff of the posterior descending artery.  He will continue his aspirin and Plavix indefinitely.   His hemodynamics have been good at cardiac rehab.  He has had one  episode of chest pain.  It is not clear whether this was  angina or  indigestion.  However, he did present with indigestion as his anginal  equivalent during his MI.  He knows to call us if he should have to take  more than one nitroglycerin.  For the time being, we will maintain him  on his current medicines.  His hemodynamics are fine and his resting  heart rate is low.  We will check LFTs in about 3 months.  He will  continue his high-dose simvastatin.   He will probably benefit from a followup Myoview in July.  At that point  we can reassess his LV function as well.     [name] Pick. Eden Emms, MD, [facility]  Electronically Signed    PCN/MedQ  DD: 06/13/2006  DT: 06/13/2006  Job #: [id number]

## 2010-09-18 NOTE — Cardiovascular Report (Signed)
NAME:  [name] Graham, [name] Graham NO.:  [id number]   MEDICAL RECORD NO.:  [id number]          PATIENT TYPE:  INP   LOCATION:  2907                         FACILITY:  [facility]   PHYSICIAN:  Veverly Fells. Excell Seltzer, MD  DATE OF BIRTH:  [birth year]   DATE OF PROCEDURE:  03/09/2006  DATE OF DISCHARGE:                              CARDIAC CATHETERIZATION   PROCEDURE:  Left heart catheterization, selective coronary angiography, left  ventricular angiography, PTCA and stenting of the left circumflex, PTCA of  the right PDA, and StarClose of the right femoral artery (failed closure  device).   INDICATION:  Mr. Roam is a very pleasant 75 year old male who has  multiple risk factors for coronary artery disease and presented with a non-  ST elevation MI.  He became chest pain free after treatment with heparin and  integralin and was subsequently referred for cardiac catheterization.   PROCEDURAL DETAILS:  Risks and indications of the procedure were explained  in detail to the patient.  Informed consent was obtained.  The right groin  was prepped, draped, and anesthetized with 1% lidocaine.  Using the modified  Seldinger technique, a 6-french arterial sheath was placed in the right  common femoral artery.  Multiple angiographic views of both the right and  left coronary arteries were taken.  For the left coronary artery, a 6-  french, JL-4 catheter was used.  For the right coronary artery, a 6-french,  JR-4 catheter was used.  Following selective coronary angiography, an angled  pigtail catheter was inserted into the left ventricle and ventricular  pressures were recorded.  A 30-degree RAO left ventriculogram was performed.  A pull back across the aortic valve was performed.  At the conclusion of the  diagnostic procedure, I elected to perform percutaneous intervention on a  high-grade lesion in the left circumflex as well as the right PDA.  See  below for details.   FINDINGS:  Aortic  pressure 112/59 with a mean of 79.  Left ventricular  pressure 111/1 with an end diastolic pressure of 17.   The left main stem is angiographically normal.  It bifurcates into the LAD  and left circumflex.  The LAD is a medium-caliber vessel.  It courses down  to the distal anterior wall, but does not quite reach the apex.  In the  proximal vessel, there is nonobstructive plaque.  There is a large first  diagonal branch that has no significant angiographic disease.  The mid LAD  splits into twin vessels.  There is a large second diagonal branch, as well,  that bifurcates into multiple branches.  There is no significant disease in  that diagonal branch.   The left circumflex is a large-caliber vessel.  The proximal vessel is  angiographically normal.  The mid vessel has a 99% stenosis that is hazy  appearing.  That vessel gives off a large second obtuse marginal that gives  of multiple branches.  The true left circumflex is small diameter and has no  significant angiographic disease.  There is a large atrial branch from the  proximal circumflex present.   The right  coronary artery is diffusely diseased.  The proximal mid and  distal vessel have serial 25% to 50% stenoses throughout.  Distally, the  vessel bifurcates into the posterior AV segment and PDA.  There is a very  high-grade focal lesion in the ostium of the PDA of 95% that is also hazy  appearing.  The posterior AV segment has nonobstructive plaque and given off  2 posterolateral branches.   Left ventriculography performed in the 30 degree right anterior oblique  projection.  The LV EF is estimated at 50%.  There is lateral wall  hypokinesis.   INTERVENTION:  Because of the high-grade focal disease in the left  circumflex and PDA in the setting of a non-ST elevation MI, I elected to  intervene on both vessels.  It was difficult to determine which was the  culprit lesion.  Both had a hazy appearance and very tight stenosis.   For  the left coronary artery, a 6-French 3.5 mm XB guiding catheter was  inserted.  Heparin and integralin were used for anticoagulation.  The  patient was on an integralin infusion on arrival to the lab.  He was given  heparin to achieve a therapeutic ACT.  A Balance Middleweight coronary  guidewire was used and passed beyond the lesion.  The lesion was pre-dilated  with a 2.5 x 15 mm Maverick balloon up to 6 atmospheres.  Following balloon  dilatation, a 3 x 16 mm Liberty bare-metal stent was placed and inflated up  to 16 atmospheres.  Following stent deployment, the stent was post-dilated  with a 3.5 x 12 mm noncompliant balloon on 2 subsequent inflations up to 16  atmospheres.  There was an excellent angiographic result with good stent  expansion and TIMI-3 flow throughout the vessel at the conclusion.  Following the left circumflex intervention, a 6-French hockey-stick was  inserted into the right coronary artery.  The right coronary artery was  tortuous and I thought this would be necessary for good support.  The same  BMW guidewire was inserted across the lesion on into the PDA.  The lesion  was dilated with the same 2.5 x 15 mm Maverick balloon up to 10 atmospheres  on 2 subsequent inflations, the second inflation was over 1 minute.  There  was an excellent angiographic result following balloon inflation with no  residual stenosis of the PDA visualized.  I elected not to stent this lesion  as it was at the bifurcation of a large posterior AV segment and there was a  good result with no dissection or residual stenosis following POBA.   At the conclusion of the case, I attempted to close the artery with a  StarClose device.  When pulling the device back, the foot plate did not  catch the inner wall of the artery and the device failed.  Manual pressure  was used for hemostasis and there was excellent hemostasis with no hematoma seen at the conclusion of holding pressure for 20  minutes.  The patient will  be continue on integralin for 12 hours.  He was given 600 mg of Plavix in  the cath lab.  He will remain on aspirin and Plavix for 12 months with a  bare minimum of 1 month of Plavix therapy after a bare-metal stent.  He  would benefit from 12 months of antiplatelet therapy in the setting of his  NSTEMI.   CONCLUSION:  1. 2-vessel coronary artery disease with a 99% lesion of the mid left  circumflex and 95% lesion in the ostial PDA.  Both treated with PCI.  2. Mild segmental LV dysfunction.      Veverly Fells. Excell Seltzer, MD  Electronically Signed     MDC/MEDQ  D:  03/09/2006  T:  03/10/2006  Job:  [id number]

## 2010-09-18 NOTE — Assessment & Plan Note (Signed)
[name], [name] Graham                    MRN:          [id number]  DATE:06/29/2006                            DOB:          1935-12-08    HISTORY OF PRESENT ILLNESS:  Mr.  Graham was seen today in follow up.  Apparently, he had a fairly bad episode of chest pain last Monday.   The patient has a history of coronary disease in November 2007.  He had  stenting of the circumflex and plain old balloon angioplasty of the PDA  distal right.  He has not had any recurrent problems.  He is going to  cardiac rehab three days a week.   He had relief of his pain with two nitroglycerin.   It is not clear to me that this was angina.  He had some GI overtones  and had some epigastric pain with it.   The patient has been compliant with his medications.   REVIEW OF SYSTEMS:  Otherwise, negative.  He had no palpitations.  No  syncope.  No PND or orthopnea.   MEDICATIONS:  1. Aspirin day.  2. Plavix 75 daily.  3. Simvastatin 80 mg daily.  4. Lovastatin 25 mg once daily.   PHYSICAL EXAMINATION:  VITAL SIGNS:  Blood pressure 130/80, pulse 60 and  regular.  HEENT:  Normal, carotids are normal without bruits.  No lymphadenopathy.  LUNGS:  Clear.  HEART:  S1, S2 with normal heart sounds.  ABDOMEN:  Benign.  EXTREMITIES:  Lower extremities have intact pulses and no edema.  NEUROLOGICAL:  Nonfocal.  SKIN:  Warm and dry.   IMPRESSION:  A 2-vessel coronary disease with stenting of the circumflex  with a balloon angioplasty of the distal RCA and PDA in November 2007.   Isolated episode of chest pain requiring two nitroglycerin.   The patient will continue his Cardiac rehabilitation.  I do not think he  needs a higher dose of Lopressor since his resting heart rate is already  60.  He will continue his aspirin and Plavix.   We had a long discussion today regarding our options.  I think for  the  time being, I prefer to continue medical therapy.  However, he knows if  he has any recurrent chest pain requiring nitroglycerin that he will  need a repeat heart catheterization.  I will see him after his last  three weeks of cardiac rehab are done.   They will monitor his blood pressures at rehab.  If they run high, we  will add an ACE inhibitor.   If the patient has recurrent chest pain, we will also add long-acting  nitrates in the form of Imdur prior to catheterizing him.   He understands the importance of going into the emergency room for any  prolonged pain that is not being relieved with nitroglycerin.   The patient prefers not to have a repeat heart catheterization at this  time and is comfortable following up with me in four weeks after his  cardiac rehab is done.   His nitroglycerin supply is fresh and he has  plenty.  He also  understands the importance of continuing his Plavix in light of his  stent being less than three months old.     Noralyn Pick. Eden Emms, MD, [facility]  Electronically Signed    PCN/MedQ  DD: 06/29/2006  DT: 06/29/2006  Job #: [id number]

## 2010-09-21 ENCOUNTER — Encounter (HOSPITAL_COMMUNITY): Payer: Medicare Other

## 2010-09-23 ENCOUNTER — Encounter (HOSPITAL_COMMUNITY): Payer: Medicare Other

## 2010-09-25 ENCOUNTER — Encounter (HOSPITAL_COMMUNITY): Payer: Medicare Other

## 2010-09-28 ENCOUNTER — Encounter (HOSPITAL_COMMUNITY): Payer: Medicare Other

## 2010-09-30 ENCOUNTER — Encounter (HOSPITAL_COMMUNITY): Payer: Medicare Other

## 2010-10-01 ENCOUNTER — Ambulatory Visit: Payer: Medicare Other | Admitting: Cardiology

## 2010-10-02 ENCOUNTER — Encounter (HOSPITAL_COMMUNITY)
Admission: RE | Admit: 2010-10-02 | Discharge: 2010-10-02 | Disposition: A | Discharge status: Home / Self Care | Payer: Medicare Other | Source: Ambulatory Visit | Attending: Cardiology | Admitting: Cardiology

## 2010-10-02 DIAGNOSIS — I200 Unstable angina: Secondary | ICD-10-CM | POA: Insufficient documentation

## 2010-10-02 DIAGNOSIS — Z5189 Encounter for other specified aftercare: Secondary | ICD-10-CM | POA: Insufficient documentation

## 2010-10-02 DIAGNOSIS — Z9861 Coronary angioplasty status: Secondary | ICD-10-CM | POA: Insufficient documentation

## 2010-10-02 DIAGNOSIS — I2510 Atherosclerotic heart disease of native coronary artery without angina pectoris: Secondary | ICD-10-CM | POA: Insufficient documentation

## 2010-10-02 DIAGNOSIS — I252 Old myocardial infarction: Secondary | ICD-10-CM | POA: Insufficient documentation

## 2010-10-05 ENCOUNTER — Encounter (HOSPITAL_COMMUNITY): Payer: Medicare Other

## 2010-10-07 ENCOUNTER — Encounter (HOSPITAL_COMMUNITY): Payer: Medicare Other

## 2010-10-09 ENCOUNTER — Encounter (HOSPITAL_COMMUNITY): Payer: Medicare Other

## 2010-10-12 ENCOUNTER — Encounter (HOSPITAL_COMMUNITY): Payer: Medicare Other

## 2010-10-14 ENCOUNTER — Encounter (HOSPITAL_COMMUNITY): Payer: Medicare Other

## 2010-10-16 ENCOUNTER — Encounter (HOSPITAL_COMMUNITY): Payer: Medicare Other

## 2010-10-19 ENCOUNTER — Encounter (HOSPITAL_COMMUNITY): Payer: Medicare Other

## 2010-10-21 ENCOUNTER — Encounter (HOSPITAL_COMMUNITY): Payer: Medicare Other

## 2010-10-23 ENCOUNTER — Encounter (HOSPITAL_COMMUNITY): Payer: Medicare Other

## 2010-10-26 ENCOUNTER — Encounter (HOSPITAL_COMMUNITY): Payer: Medicare Other

## 2010-10-28 ENCOUNTER — Encounter (HOSPITAL_COMMUNITY): Payer: Medicare Other

## 2010-10-30 ENCOUNTER — Encounter (HOSPITAL_COMMUNITY): Payer: Medicare Other

## 2010-11-02 ENCOUNTER — Encounter (HOSPITAL_COMMUNITY): Payer: Medicare Other

## 2010-11-02 DIAGNOSIS — I252 Old myocardial infarction: Secondary | ICD-10-CM | POA: Insufficient documentation

## 2010-11-02 DIAGNOSIS — I200 Unstable angina: Secondary | ICD-10-CM | POA: Insufficient documentation

## 2010-11-02 DIAGNOSIS — Z9861 Coronary angioplasty status: Secondary | ICD-10-CM | POA: Insufficient documentation

## 2010-11-02 DIAGNOSIS — I2510 Atherosclerotic heart disease of native coronary artery without angina pectoris: Secondary | ICD-10-CM | POA: Insufficient documentation

## 2010-11-02 DIAGNOSIS — Z5189 Encounter for other specified aftercare: Secondary | ICD-10-CM | POA: Insufficient documentation

## 2010-11-04 ENCOUNTER — Encounter (HOSPITAL_COMMUNITY): Payer: Medicare Other

## 2010-11-06 ENCOUNTER — Encounter (HOSPITAL_COMMUNITY)
Admission: RE | Admit: 2010-11-06 | Discharge: 2010-11-06 | Disposition: A | Discharge status: Home / Self Care | Payer: Medicare Other | Source: Ambulatory Visit | Attending: Cardiology | Admitting: Cardiology

## 2010-11-19 NOTE — Progress Notes (Signed)
Cardiac Rehab Progress Report  Orientation:  07/13/2010 Graduate Date:  11/06/2010 Discharge Date:  11/06/10  Cardiologist:  Nona Dell Family MD:  No PCP Class Time:  08:15  A.  Exercise Program:  Discharged to home exercise program.  Anticipated compliance:  excellent  B.  Mental Health:  Good mental attitude  C.  Education/Instruction/Skills  Knows THR for exercise  D.  Nutrition/Weight Control/Body Composition:  Adherence to prescribed nutrition program: good   E.  Blood Lipids    Lab Results  Component Value Date   CHOL 144 09/11/2010     Lab Results  Component Value Date   TRIG 52 09/11/2010     Lab Results  Component Value Date   HDL 55 09/11/2010     Lab Results  Component Value Date   CHOLHDL 2.6 09/11/2010     No results found for this basename: LDLDIRECT      F.  Lifestyle Changes:  Making positive lifestyle changes  G.  Symptoms noted with exercise:  Asymptomatic  Report Completed By:  Angelica Pou  Comments:  Patient did very well while in rehab. He achieved a peak mets of 3.1 while here. He plans to exercise at home by walking outside as weather permits

## 2010-12-07 ENCOUNTER — Other Ambulatory Visit: Payer: Self-pay | Admitting: Cardiology

## 2010-12-09 ENCOUNTER — Telehealth: Payer: Self-pay | Admitting: Cardiology

## 2010-12-09 NOTE — Telephone Encounter (Signed)
Patient states that his RX for generic Plavix and Simvastatin requires pre-authorization per [facility] in [place] / states that they were to send paperwork on Monday/ tg

## 2010-12-10 ENCOUNTER — Other Ambulatory Visit: Payer: Self-pay

## 2010-12-10 MED ORDER — CLOPIDOGREL BISULFATE 75 MG PO TABS: 75.0000 mg | ORAL_TABLET | Freq: Every day | ORAL | Status: DC

## 2010-12-10 MED ORDER — SIMVASTATIN 40 MG PO TABS: 40.0000 mg | ORAL_TABLET | Freq: Every day | ORAL | Status: DC

## 2011-01-12 ENCOUNTER — Encounter: Payer: Self-pay | Admitting: Cardiology

## 2011-01-15 ENCOUNTER — Encounter: Payer: Self-pay | Admitting: Cardiology

## 2011-01-15 ENCOUNTER — Ambulatory Visit (INDEPENDENT_AMBULATORY_CARE_PROVIDER_SITE_OTHER): Payer: Medicare Other | Admitting: Cardiology

## 2011-01-15 VITALS — BP 156/78 | HR 56 | Ht 70.5 in | Wt 158.0 lb

## 2011-01-15 DIAGNOSIS — F17200 Nicotine dependence, unspecified, uncomplicated: Secondary | ICD-10-CM

## 2011-01-15 DIAGNOSIS — E782 Mixed hyperlipidemia: Secondary | ICD-10-CM

## 2011-01-15 DIAGNOSIS — I2510 Atherosclerotic heart disease of native coronary artery without angina pectoris: Secondary | ICD-10-CM

## 2011-01-15 DIAGNOSIS — I10 Essential (primary) hypertension: Secondary | ICD-10-CM

## 2011-01-15 MED ORDER — METOPROLOL SUCCINATE ER 25 MG PO TB24: 25.0000 mg | ORAL_TABLET | Freq: Every day | ORAL | Status: DC

## 2011-01-15 NOTE — Assessment & Plan Note (Signed)
Lipids are fairly well-controlled. Continue present regimen. Followup fasting lipid profile and liver function tests for next visit.

## 2011-01-15 NOTE — Assessment & Plan Note (Signed)
Blood pressure elevated today. He states that it had gotten better toward the end of cardiac rehabilitation. We discussed diet, sodium restriction, also advancing exercise over time.

## 2011-01-15 NOTE — Assessment & Plan Note (Signed)
Continue to work on smoking cessation. 

## 2011-01-15 NOTE — Progress Notes (Signed)
Clinical Summary [name] Graham is a 75 y.o.male presenting for followup. He was seen in May.  Lab work from May showed AST 16, ALT 10, total cholesterol 144, triglycerides 52, HDL 55, LDL 79. We discussed this today. He reports compliance with his medications.  States he walks approximately 1/2 mile each morning before work. 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 He reports no significant angina or progressive shortness of breath.  We have discussed smoking cessation over time. He has not been able to quit as yet.  No Known Allergies  Medication list reviewed.  Past Medical History  Diagnosis Date  . CAD (coronary artery disease)     BMS circ and PTCA PDA 2007 ,DES RCA 10/09, DES LAD 2/12  . COPD (chronic obstructive pulmonary disease)   . GERD (gastroesophageal reflux disease)   . Hyperlipidemia   . Hypertension   . Myocardial infarction     No past surgical history on file.  No family history on file.  Social History [name] Graham reports that he has been smoking Cigarettes.  He has never used smokeless tobacco. [name] Graham reports that he does not drink alcohol.  Review of Systems No reported bleeding problems, no palpitations or syncope.  Physical Examination Filed Vitals:   01/15/11 0816  BP: 156/78  Pulse: 56   Normally nourished appearing male in no acute distress.  HEENT: Conjunctiva and lids normal, oropharynx with moist mucosa, poor dentition.  Neck: Supple, no carotid bruits or elevated JVP.  Lungs: Clear with diminished breath sounds, nonlabored.  Cardiac: Regular rate and rhythm, no significant systolic murmur or S3.  Abdomen: Soft, nontender, bowel sounds present.  Skin: Warm and dry.  Extremities: No pitting edema, distal pulses 2+.  Musculoskeletal: No kyphosis.  Neuropsychiatric: Alert and oriented x3, affect appropriate.    Problem List and Plan

## 2011-01-15 NOTE — Patient Instructions (Signed)
**Note De-identified Damoni Causby Obfuscation** Your physician recommends that you continue on your current medications as directed. Please refer to the Current Medication list given to you today.  Your physician recommends that you return for lab work in: 6 months, just before next office visit.  Your physician recommends that you schedule a follow-up appointment in: 6 months   

## 2011-01-15 NOTE — Assessment & Plan Note (Signed)
Symptomatically stable on medical therapy. Continue exercise and observation.

## 2011-02-01 LAB — POCT CARDIAC MARKERS
CKMB, poc: <1 — ABNORMAL LOW
CKMB, poc: <1 — ABNORMAL LOW
Myoglobin, poc: 46.6
Troponin i, poc: 0.22 — ABNORMAL HIGH
Troponin i, poc: <0.05

## 2011-02-01 LAB — LIPID PANEL
HDL: 51
Total CHOL/HDL Ratio: 2.5
Triglycerides: 67
VLDL: 13

## 2011-02-01 LAB — CBC
HCT: 44.6
HCT: 44.8
Hemoglobin: 15.1
Hemoglobin: 15.2
MCHC: 33.6
MCHC: 34.2
MCV: 95.4
MCV: 96.6
MCV: 97.1
Platelets: 172
Platelets: 186
Platelets: 196
RBC: 4.4
RBC: 4.61
RBC: 4.62
RDW: 13.6
RDW: 13.7
WBC: 7
WBC: 7.2
WBC: 7.5
WBC: 8.1

## 2011-02-01 LAB — CK TOTAL AND CKMB (NOT AT ARMC)
CK, MB: 1.2
CK, MB: 1.4
CK, MB: 1.4
Relative Index: INVALID
Total CK: 70
Total CK: 76
Total CK: 87

## 2011-02-01 LAB — BASIC METABOLIC PANEL
BUN: 16
Chloride: 107
Chloride: 107
Creatinine, Ser: 0.79
Creatinine, Ser: 0.97
Creatinine, Ser: 1.22
GFR calc Af Amer: >60
GFR calc Af Amer: >60
GFR calc non Af Amer: >60
Sodium: 139

## 2011-02-01 LAB — DIFFERENTIAL
Basophils Absolute: 0.1
Basophils Relative: 1
Eosinophils Absolute: 0.3
Eosinophils Relative: 4
Lymphocytes Relative: 31
Lymphs Abs: 2.3
Monocytes Relative: 8
Neutro Abs: 4.3
Neutrophils Relative %: 48
Neutrophils Relative %: 57

## 2011-02-01 LAB — HEPARIN LEVEL (UNFRACTIONATED)
Heparin Unfractionated: 0.41
Heparin Unfractionated: 0.48

## 2011-02-01 LAB — TROPONIN I: Troponin I: <0.01

## 2011-02-01 LAB — PROTIME-INR
INR: 1.1
Prothrombin Time: 14.6

## 2011-02-01 LAB — GLUCOSE, CAPILLARY: Glucose-Capillary: 101 — ABNORMAL HIGH

## 2011-02-01 LAB — MAGNESIUM: Magnesium: 2.3

## 2011-02-12 ENCOUNTER — Other Ambulatory Visit: Payer: Self-pay | Admitting: *Deleted

## 2011-02-12 MED ORDER — LISINOPRIL 40 MG PO TABS: 40.0000 mg | ORAL_TABLET | Freq: Every day | ORAL | Status: DC

## 2011-03-09 ENCOUNTER — Other Ambulatory Visit: Payer: Self-pay | Admitting: Cardiology

## 2011-04-12 ENCOUNTER — Other Ambulatory Visit: Payer: Self-pay | Admitting: *Deleted

## 2011-04-12 MED ORDER — SIMVASTATIN 40 MG PO TABS: 40.0000 mg | ORAL_TABLET | Freq: Every day | ORAL | Status: DC

## 2011-06-10 ENCOUNTER — Other Ambulatory Visit: Payer: Self-pay | Admitting: Cardiology

## 2011-06-24 ENCOUNTER — Other Ambulatory Visit: Payer: Self-pay | Admitting: *Deleted

## 2011-06-24 DIAGNOSIS — E782 Mixed hyperlipidemia: Secondary | ICD-10-CM

## 2011-07-02 ENCOUNTER — Other Ambulatory Visit: Payer: Self-pay | Admitting: Cardiology

## 2011-07-03 LAB — LIPID PANEL
Total CHOL/HDL Ratio: 2.7 Ratio
VLDL: 17 mg/dL (ref 0–40)

## 2011-07-03 LAB — HEPATIC FUNCTION PANEL
AST: 16 U/L (ref 0–37)
Albumin: 4.1 g/dL (ref 3.5–5.2)
Alkaline Phosphatase: 47 U/L (ref 39–117)
Bilirubin, Direct: 0.1 mg/dL (ref 0.0–0.3)
Total Bilirubin: 0.4 mg/dL (ref 0.3–1.2)

## 2011-07-13 ENCOUNTER — Other Ambulatory Visit: Payer: Self-pay | Admitting: *Deleted

## 2011-07-13 MED ORDER — CLOPIDOGREL BISULFATE 75 MG PO TABS: 75.0000 mg | ORAL_TABLET | Freq: Every day | ORAL | Status: DC

## 2011-07-15 ENCOUNTER — Ambulatory Visit (INDEPENDENT_AMBULATORY_CARE_PROVIDER_SITE_OTHER): Payer: Medicare Other | Admitting: Cardiology

## 2011-07-15 ENCOUNTER — Encounter: Payer: Self-pay | Admitting: Cardiology

## 2011-07-15 ENCOUNTER — Encounter: Payer: Self-pay | Admitting: *Deleted

## 2011-07-15 VITALS — BP 143/81 | HR 51 | Resp 16 | Ht 70.0 in | Wt 162.0 lb

## 2011-07-15 DIAGNOSIS — I10 Essential (primary) hypertension: Secondary | ICD-10-CM

## 2011-07-15 DIAGNOSIS — I2510 Atherosclerotic heart disease of native coronary artery without angina pectoris: Secondary | ICD-10-CM

## 2011-07-15 DIAGNOSIS — E782 Mixed hyperlipidemia: Secondary | ICD-10-CM

## 2011-07-15 DIAGNOSIS — F17200 Nicotine dependence, unspecified, uncomplicated: Secondary | ICD-10-CM

## 2011-07-15 NOTE — Patient Instructions (Signed)
Your physician recommends that you schedule a follow-up appointment in: 6 months  Your physician recommends that you return for lab work in: 6 months (liver and lipids)

## 2011-07-15 NOTE — Assessment & Plan Note (Signed)
Blood pressure somewhat better compared to last visit. Continue to address diet, exercise.

## 2011-07-15 NOTE — Progress Notes (Signed)
   Clinical Summary [name] Graham is a 76 y.o.male presenting for followup. He was seen in September 2012. He reports no angina, no nitroglycerin use. 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  Recent lab work from 3/4 showed normal liver function tests, cholesterol 131, triglycerides 87, HDL 48, LDL 66. We reviewed these results today. He reports compliance with his medications.  Followup ECG is reviewed below.  We discussed smoking cessation again today. He states that he has had most success in the past by quitting "cold [place]." Did not tolerate Chantix previously.  No Known Allergies  Current Outpatient Prescriptions  Medication Sig Dispense Refill  . aspirin 325 MG tablet Take 325 mg by mouth daily.        . clopidogrel (PLAVIX) 75 MG tablet TAKE 1 TABLET EVERY DAY  30 tablet  2  . lisinopril (PRINIVIL,ZESTRIL) 40 MG tablet Take 1 tablet (40 mg total) by mouth daily.  30 tablet  6  . metoprolol succinate (TOPROL-XL) 25 MG 24 hr tablet Take 1 tablet (25 mg total) by mouth daily.  30 tablet  6  . NITROSTAT 0.4 MG SL tablet Place 0.4 mg under the tongue every 5 (five) minutes as needed.       . simvastatin (ZOCOR) 80 MG tablet Take 80 mg by mouth at bedtime.      Marland Kitchen DISCONTD: clopidogrel (PLAVIX) 75 MG tablet Take 1 tablet (75 mg total) by mouth daily.  30 tablet  2    Past Medical History  Diagnosis Date  . CAD (coronary artery disease)     BMS circ and PTCA PDA 2007 ,DES RCA 10/09, DES LAD 2/12  . COPD (chronic obstructive pulmonary disease)   . GERD (gastroesophageal reflux disease)   . Hyperlipidemia   . Hypertension   . Myocardial infarction     Social History [name] Graham reports that he has been smoking Cigarettes.  He has never used smokeless tobacco. [name] Graham reports that he does not drink alcohol.  Review of Systems Has had a vague tingling sensation in his left hand, mainly in the region of his index finger and thumb,  notes that it is worse after he has been lying on his left arm. No motor weakness, speech problems, memory problems. Otherwise negative.  Physical Examination Filed Vitals:   07/15/11 0849  BP: 143/81  Pulse: 51  Resp: 16   Normally nourished appearing male in no acute distress.  HEENT: Conjunctiva and lids normal, oropharynx with moist mucosa, poor dentition.  Neck: Supple, no carotid bruits or elevated JVP.  Lungs: Clear with diminished breath sounds, nonlabored.  Cardiac: Regular rate and rhythm, no significant systolic murmur or S3.  Abdomen: Soft, nontender, bowel sounds present.  Skin: Warm and dry.  Extremities: No pitting edema, distal pulses 2+.  Musculoskeletal: No kyphosis.  Neuropsychiatric: Alert and oriented x3, affect appropriate. Equal upper extremity motor strength.   ECG Sinus bradycardia at 50 beats per minute.   Problem List and Plan

## 2011-07-15 NOTE — Assessment & Plan Note (Signed)
Symptomatically stable on medical therapy. Followup ECG is reviewed. Plan to continue observation, followup arranged.

## 2011-07-15 NOTE — Assessment & Plan Note (Signed)
We continue to discuss smoking cessation. 

## 2011-07-15 NOTE — Assessment & Plan Note (Signed)
Lipids have been well controlled. Followup lab work in 6 months.

## 2011-09-15 ENCOUNTER — Other Ambulatory Visit: Payer: Self-pay | Admitting: Cardiology

## 2011-09-15 MED ORDER — LISINOPRIL 40 MG PO TABS: 40.0000 mg | ORAL_TABLET | Freq: Every day | ORAL | Status: DC

## 2011-10-04 ENCOUNTER — Emergency Department (HOSPITAL_COMMUNITY)
Admission: EM | Admit: 2011-10-04 | Discharge: 2011-10-04 | Disposition: A | Discharge status: Home / Self Care | Payer: Medicare Other | Attending: Emergency Medicine | Admitting: Emergency Medicine

## 2011-10-04 ENCOUNTER — Emergency Department (HOSPITAL_COMMUNITY): Payer: Medicare Other

## 2011-10-04 ENCOUNTER — Encounter (HOSPITAL_COMMUNITY): Payer: Self-pay | Admitting: *Deleted

## 2011-10-04 DIAGNOSIS — I2510 Atherosclerotic heart disease of native coronary artery without angina pectoris: Secondary | ICD-10-CM | POA: Insufficient documentation

## 2011-10-04 DIAGNOSIS — Z79899 Other long term (current) drug therapy: Secondary | ICD-10-CM | POA: Insufficient documentation

## 2011-10-04 DIAGNOSIS — I10 Essential (primary) hypertension: Secondary | ICD-10-CM | POA: Insufficient documentation

## 2011-10-04 DIAGNOSIS — J40 Bronchitis, not specified as acute or chronic: Secondary | ICD-10-CM | POA: Insufficient documentation

## 2011-10-04 DIAGNOSIS — R05 Cough: Secondary | ICD-10-CM | POA: Insufficient documentation

## 2011-10-04 DIAGNOSIS — I252 Old myocardial infarction: Secondary | ICD-10-CM | POA: Insufficient documentation

## 2011-10-04 DIAGNOSIS — R059 Cough, unspecified: Secondary | ICD-10-CM | POA: Insufficient documentation

## 2011-10-04 DIAGNOSIS — F17200 Nicotine dependence, unspecified, uncomplicated: Secondary | ICD-10-CM | POA: Insufficient documentation

## 2011-10-04 LAB — BASIC METABOLIC PANEL
Calcium: 9.4 mg/dL (ref 8.4–10.5)
GFR calc Af Amer: >90 mL/min (ref >90)
GFR calc non Af Amer: 81 mL/min — ABNORMAL LOW (ref >90)
Glucose, Bld: 96 mg/dL (ref 70–99)
Potassium: 4.9 mEq/L (ref 3.5–5.1)
Sodium: 136 mEq/L (ref 135–145)

## 2011-10-04 LAB — CBC
MCH: 32.5 pg (ref 26.0–34.0)
Platelets: 162 10*3/uL (ref 150–400)
RBC: 4.67 MIL/uL (ref 4.22–5.81)
RDW: 12.9 % (ref 11.5–15.5)
WBC: 10.2 10*3/uL (ref 4.0–10.5)

## 2011-10-04 LAB — CARDIAC PANEL(CRET KIN+CKTOT+MB+TROPI)
CK, MB: 1.7 ng/mL (ref 0.3–4.0)
Total CK: 135 U/L (ref 7–232)
Troponin I: <0.3 ng/mL (ref <0.30)

## 2011-10-04 LAB — DIFFERENTIAL
Basophils Absolute: 0 10*3/uL (ref 0.0–0.1)
Eosinophils Absolute: 0.1 10*3/uL (ref 0.0–0.7)
Lymphs Abs: 1 10*3/uL (ref 0.7–4.0)
Neutrophils Relative %: 81 % — ABNORMAL HIGH (ref 43–77)

## 2011-10-04 MED ORDER — ALBUTEROL SULFATE HFA 108 (90 BASE) MCG/ACT IN AERS: 1.0000 | INHALATION_SPRAY | Freq: Four times a day (QID) | RESPIRATORY_TRACT | Status: DC | PRN

## 2011-10-04 MED ORDER — ALBUTEROL SULFATE (5 MG/ML) 0.5% IN NEBU
2.5000 mg | INHALATION_SOLUTION | Freq: Once | RESPIRATORY_TRACT | Status: AC
  Administered 2011-10-04: 2.5 mg via RESPIRATORY_TRACT
  Filled 2011-10-04: qty 0.5

## 2011-10-04 MED ORDER — DOXYCYCLINE HYCLATE 100 MG PO CAPS: 100.0000 mg | ORAL_CAPSULE | Freq: Two times a day (BID) | ORAL | Status: AC

## 2011-10-04 NOTE — ED Notes (Addendum)
Cough, aching.  Sputum is white and green.  Diarrhea on Saturday, none since, no vomiting

## 2011-10-04 NOTE — Discharge Instructions (Signed)

## 2011-10-04 NOTE — ED Provider Notes (Signed)
History   This chart was scribed for Glynn Octave, MD by Clarita Crane. The patient was seen in room APA10/APA10. Patient's care was started at 1325.    CSN: [id number]  Arrival date & time 10/04/11  1325   First MD Initiated Contact with Patient 10/04/11 1422      Chief Complaint  Patient presents with  . Cough    (Consider location/radiation/quality/duration/timing/severity/associated sxs/prior treatment) HPI [name] Graham is a 76 y.o. male who presents to the Emergency Department complaining of constant moderate productive cough with white sputum onset 3 days ago with associated myalgias to bilateral lower extremities and 1 episode of diarrhea. Notes cough is relieved by nothing. Denies chest pain, SOB, fever, chills, nausea, vomiting, HA, recent sick contacts. Patient with h/o CAD, COPD, GERD, HLD, HTN, MI and is a current smoker (1 pack per day).   Past Medical History  Diagnosis Date  . CAD (coronary artery disease)     BMS circ and PTCA PDA 2007 ,DES RCA 10/09, DES LAD 2/12  . COPD (chronic obstructive pulmonary disease)   . GERD (gastroesophageal reflux disease)   . Hyperlipidemia   . Hypertension   . Myocardial infarction     Past Surgical History  Procedure Date  . Coronary angioplasty with stent placement     History reviewed. No pertinent family history.  History  Substance Use Topics  . Smoking status: Current Everyday Smoker    Types: Cigarettes  . Smokeless tobacco: Never Used  . Alcohol Use: No      Review of Systems A complete 10 system review of systems was obtained and all systems are negative except as noted in the HPI and PMH.   Allergies  Review of patient's allergies indicates no known allergies.  Home Medications   Current Outpatient Rx  Name Route Sig Dispense Refill  . ASPIRIN 325 MG PO TABS Oral Take 325 mg by mouth every evening.    Marland Kitchen CLOPIDOGREL BISULFATE 75 MG PO TABS      . [facility] FLU RELIEF PO Oral Take 2 tablets by mouth  daily as needed. For cough/relief: Contains APAP 500mg , Dextromethorphan 15mg , and Chlorpheniramine 2mg     . LISINOPRIL 40 MG PO TABS Oral Take 40 mg by mouth every morning.    Marland Kitchen METOPROLOL SUCCINATE ER 25 MG PO TB24 Oral Take 25 mg by mouth every morning.    Marland Kitchen SIMVASTATIN 80 MG PO TABS Oral Take 80 mg by mouth at bedtime.    Marland Kitchen NITROSTAT 0.4 MG SL SUBL Sublingual Place 0.4 mg under the tongue every 5 (five) minutes as needed.       BP 163/83  Pulse 75  Temp(Src) 99.6 F (37.6 C) (Oral)  Resp 16  Ht 5\' 10"  (1.778 m)  Wt 151 lb (68.493 kg)  BMI 21.67 kg/m2  SpO2 97%  Physical Exam  Nursing note and vitals reviewed. Constitutional: He is oriented to person, place, and time. He appears well-developed and well-nourished. No distress.  HENT:  Head: Normocephalic and atraumatic.  Mouth/Throat: Oropharynx is clear and moist.  Eyes: EOM are normal. Pupils are equal, round, and reactive to light.  Neck: Neck supple. No tracheal deviation present.  Cardiovascular: Normal rate and regular rhythm.  Exam reveals no gallop and no friction rub.   No murmur heard. Pulmonary/Chest: Effort normal. No respiratory distress. He has no wheezes. He has no rales.       Decreased air movement bilaterally.   Abdominal: Soft. He exhibits no distension. There  is no tenderness.  Musculoskeletal: Normal range of motion. He exhibits no edema.  Neurological: He is alert and oriented to person, place, and time. No sensory deficit.  Skin: Skin is warm and dry.  Psychiatric: He has a normal mood and affect. His behavior is normal.    ED Course  Procedures (including critical care time)  DIAGNOSTIC STUDIES: Oxygen Saturation is 97% on room air, normal by my interpretation.    COORDINATION OF CARE: 2:43PM-Patient informed of current plan for treatment and evaluation and agrees with plan at this time.    Labs Reviewed  DIFFERENTIAL - Abnormal; Notable for the following:    Neutrophils Relative 81 (*)     Neutro Abs 8.2 (*)    Lymphocytes Relative 10 (*)    All other components within normal limits  BASIC METABOLIC PANEL - Abnormal; Notable for the following:    GFR calc non Af Amer 81 (*)    All other components within normal limits  CBC  CARDIAC PANEL(CRET KIN+CKTOT+MB+TROPI)   Dg Chest 2 View  10/04/2011  *RADIOLOGY REPORT*  Clinical Data: Cough, hypertension.  CHEST - 2 VIEW  Comparison: 06/12/2010  Findings: Hyperinflation with coarse perihilar and infrahilar interstitial markings as before.  No confluent airspace infiltrate or overt edema.  No effusion.  Heart size normal.  Regional bones unremarkable.  IMPRESSION:  1.  Hyperinflation without acute or superimposed abnormality.  Original Report Authenticated By: Osa Craver, M.D.     No diagnosis found.    MDM  3 days of cough, congestion, body aches. No chest pain, fever.  Chest xray negative.  Lungs clear.  EKG nonischemic. Will treat for bronchitis.  Follow up PCP this week.    Date: 10/04/2011  Rate: 69  Rhythm: normal sinus rhythm  QRS Axis: normal  Intervals: normal  ST/T Wave abnormalities: normal  Conduction Disutrbances:none  Narrative Interpretation:   Old EKG Reviewed: unchanged     I personally performed the services described in this documentation, which was scribed in my presence.  The recorded information has been reviewed and considered.    Glynn Octave, MD 10/04/11 1622

## 2011-10-07 ENCOUNTER — Ambulatory Visit (INDEPENDENT_AMBULATORY_CARE_PROVIDER_SITE_OTHER): Payer: Medicare Other | Admitting: Adult Health

## 2011-10-07 ENCOUNTER — Encounter: Payer: Self-pay | Admitting: Adult Health

## 2011-10-07 VITALS — BP 144/72 | HR 53 | Resp 16 | Ht 70.0 in | Wt 151.0 lb

## 2011-10-07 DIAGNOSIS — J4489 Other specified chronic obstructive pulmonary disease: Secondary | ICD-10-CM

## 2011-10-07 DIAGNOSIS — F17200 Nicotine dependence, unspecified, uncomplicated: Secondary | ICD-10-CM

## 2011-10-07 DIAGNOSIS — I2510 Atherosclerotic heart disease of native coronary artery without angina pectoris: Secondary | ICD-10-CM

## 2011-10-07 DIAGNOSIS — J449 Chronic obstructive pulmonary disease, unspecified: Secondary | ICD-10-CM

## 2011-10-07 NOTE — Patient Instructions (Signed)
Your physician recommends that you schedule a follow-up appointment in: 6 months  Your physician has recommended you make the following change in your medication:  1 - START PLAIN Robitussin cough syrup as needed 2 - START Guaifenison 1200 mg twice a day for 5 days

## 2011-10-07 NOTE — Progress Notes (Signed)
   HPI: [name] is a friendly 76 y/o patient of Dr. Diona Browner we are following for ongoing assessment and treatment of CAD, hypertension, hyperlipidemia, and COPD. He was recently seen in the ER for bronchitis, when he attempted to quit smoking. Had a chronic cough that worsened with associated shortness of breath. He was placed on doxycycline for 10 days. He continues to have coughing spells each day. He is otherwise without complaint of chest pain. He does have some occasional DOE when he is walking around his yard. Usually subsides with rest. He is medically complaint.   No Known Allergies  Current Outpatient Prescriptions  Medication Sig Dispense Refill  . albuterol (PROVENTIL HFA;VENTOLIN HFA) 108 (90 BASE) MCG/ACT inhaler Inhale 1-2 puffs into the lungs every 6 (six) hours as needed for wheezing.  1 Inhaler  0  . aspirin 325 MG tablet Take 325 mg by mouth every evening.      . clopidogrel (PLAVIX) 75 MG tablet       . doxycycline (VIBRAMYCIN) 100 MG capsule Take 1 capsule (100 mg total) by mouth 2 (two) times daily.  20 capsule  0  . lisinopril (PRINIVIL,ZESTRIL) 40 MG tablet Take 40 mg by mouth every morning.      . metoprolol succinate (TOPROL-XL) 25 MG 24 hr tablet Take 25 mg by mouth every morning.      Marland Kitchen NITROSTAT 0.4 MG SL tablet Place 0.4 mg under the tongue every 5 (five) minutes as needed.       . simvastatin (ZOCOR) 80 MG tablet Take 80 mg by mouth at bedtime.        Past Medical History  Diagnosis Date  . CAD (coronary artery disease)     BMS circ and PTCA PDA 2007 ,DES RCA 10/09, DES LAD 2/12  . COPD (chronic obstructive pulmonary disease)   . GERD (gastroesophageal reflux disease)   . Hyperlipidemia   . Hypertension   . Myocardial infarction     Past Surgical History  Procedure Date  . Coronary angioplasty with stent placement     WUJ:WJXBJY of systems complete and found to be negative unless listed above  PHYSICAL EXAM BP 144/72  Pulse 53  Resp 16  Ht  5\' 10"  (1.778 m)  Wt 151 lb (68.493 kg)  BMI 21.67 kg/m2  General: Well developed, well nourished, in no acute distress Head: Eyes PERRLA, No xanthomas.   Normal cephalic and atramatic  Lungs: Mild bibasilar crackles, no wheezes are noted.  Heart: HRRR S1 S2, without MRG.  Pulses are 2+ & equal.            No carotid bruit. No JVD.  No abdominal bruits. No femoral bruits. Abdomen: Bowel sounds are positive, abdomen soft and non-tender without masses or                  Hernia's noted. Msk:  Back normal, normal gait. Normal strength and tone for age. Extremities: No clubbing, cyanosis or edema.  DP +1 Neuro: Alert and oriented X 3. Psych:  Good affect, responds appropriately  :  ASSESSMENT AND PLAN

## 2011-10-07 NOTE — Assessment & Plan Note (Signed)
Continue smoking cessation, inhalers prn.

## 2011-10-07 NOTE — Assessment & Plan Note (Signed)
He is stable at present. BP is mildly elevated with coughing spell. No changes in his medication regimen at this time.

## 2011-10-07 NOTE — Assessment & Plan Note (Signed)
He is down to one cigarette a day. He continues to have coughing. I have given him Rx for Robitussin to assist with this and some guiafenisin for expectorant. I have encouraged him stop completely.

## 2011-11-08 ENCOUNTER — Other Ambulatory Visit: Payer: Self-pay | Admitting: Cardiology

## 2011-11-08 MED ORDER — SIMVASTATIN 80 MG PO TABS: 80.0000 mg | ORAL_TABLET | Freq: Every day | ORAL | Status: DC

## 2011-11-11 ENCOUNTER — Other Ambulatory Visit: Payer: Self-pay | Admitting: Cardiology

## 2011-11-11 MED ORDER — SIMVASTATIN 80 MG PO TABS: 80.0000 mg | ORAL_TABLET | Freq: Every day | ORAL | Status: DC

## 2011-11-22 ENCOUNTER — Other Ambulatory Visit: Payer: Self-pay | Admitting: Cardiology

## 2011-11-22 MED ORDER — SIMVASTATIN 80 MG PO TABS: 80.0000 mg | ORAL_TABLET | Freq: Every day | ORAL | Status: DC

## 2011-11-22 MED ORDER — CLOPIDOGREL BISULFATE 75 MG PO TABS: 75.0000 mg | ORAL_TABLET | Freq: Every day | ORAL | Status: DC

## 2011-11-29 ENCOUNTER — Other Ambulatory Visit: Payer: Self-pay | Admitting: Cardiology

## 2011-11-29 MED ORDER — NITROGLYCERIN 0.4 MG SL SUBL: 0.4000 mg | SUBLINGUAL_TABLET | SUBLINGUAL | Status: DC | PRN

## 2011-12-30 ENCOUNTER — Other Ambulatory Visit: Payer: Self-pay | Admitting: *Deleted

## 2011-12-30 DIAGNOSIS — E782 Mixed hyperlipidemia: Secondary | ICD-10-CM

## 2012-01-06 ENCOUNTER — Other Ambulatory Visit: Payer: Self-pay | Admitting: Adult Health

## 2012-01-06 LAB — HEPATIC FUNCTION PANEL
Alkaline Phosphatase: 47 U/L (ref 39–117)
Bilirubin, Direct: 0.1 mg/dL (ref 0.0–0.3)
Indirect Bilirubin: 0.4 mg/dL (ref 0.0–0.9)
Total Bilirubin: 0.5 mg/dL (ref 0.3–1.2)

## 2012-01-06 LAB — LIPID PANEL
LDL Cholesterol: 71 mg/dL (ref 0–99)
Triglycerides: 66 mg/dL (ref <150)
VLDL: 13 mg/dL (ref 0–40)

## 2012-01-14 ENCOUNTER — Other Ambulatory Visit: Payer: Self-pay | Admitting: *Deleted

## 2012-01-14 DIAGNOSIS — E782 Mixed hyperlipidemia: Secondary | ICD-10-CM

## 2012-03-16 ENCOUNTER — Emergency Department (HOSPITAL_COMMUNITY)
Admission: EM | Admit: 2012-03-16 | Discharge: 2012-03-17 | Disposition: A | Discharge status: Home / Self Care | Payer: Medicare Other | Attending: Emergency Medicine | Admitting: Emergency Medicine

## 2012-03-16 ENCOUNTER — Other Ambulatory Visit: Payer: Self-pay

## 2012-03-16 ENCOUNTER — Encounter (HOSPITAL_COMMUNITY): Payer: Self-pay | Admitting: Emergency Medicine

## 2012-03-16 DIAGNOSIS — I10 Essential (primary) hypertension: Secondary | ICD-10-CM | POA: Insufficient documentation

## 2012-03-16 DIAGNOSIS — J449 Chronic obstructive pulmonary disease, unspecified: Secondary | ICD-10-CM | POA: Insufficient documentation

## 2012-03-16 DIAGNOSIS — I252 Old myocardial infarction: Secondary | ICD-10-CM | POA: Insufficient documentation

## 2012-03-16 DIAGNOSIS — R079 Chest pain, unspecified: Secondary | ICD-10-CM | POA: Insufficient documentation

## 2012-03-16 DIAGNOSIS — F17200 Nicotine dependence, unspecified, uncomplicated: Secondary | ICD-10-CM | POA: Insufficient documentation

## 2012-03-16 DIAGNOSIS — Z79899 Other long term (current) drug therapy: Secondary | ICD-10-CM | POA: Insufficient documentation

## 2012-03-16 DIAGNOSIS — Z9861 Coronary angioplasty status: Secondary | ICD-10-CM | POA: Insufficient documentation

## 2012-03-16 DIAGNOSIS — J189 Pneumonia, unspecified organism: Secondary | ICD-10-CM | POA: Insufficient documentation

## 2012-03-16 DIAGNOSIS — R091 Pleurisy: Secondary | ICD-10-CM | POA: Insufficient documentation

## 2012-03-16 DIAGNOSIS — K219 Gastro-esophageal reflux disease without esophagitis: Secondary | ICD-10-CM | POA: Insufficient documentation

## 2012-03-16 DIAGNOSIS — J4489 Other specified chronic obstructive pulmonary disease: Secondary | ICD-10-CM | POA: Insufficient documentation

## 2012-03-16 DIAGNOSIS — Z7982 Long term (current) use of aspirin: Secondary | ICD-10-CM | POA: Insufficient documentation

## 2012-03-16 DIAGNOSIS — Z7902 Long term (current) use of antithrombotics/antiplatelets: Secondary | ICD-10-CM | POA: Insufficient documentation

## 2012-03-16 DIAGNOSIS — I2510 Atherosclerotic heart disease of native coronary artery without angina pectoris: Secondary | ICD-10-CM | POA: Insufficient documentation

## 2012-03-16 DIAGNOSIS — E785 Hyperlipidemia, unspecified: Secondary | ICD-10-CM | POA: Insufficient documentation

## 2012-03-16 DIAGNOSIS — J3489 Other specified disorders of nose and nasal sinuses: Secondary | ICD-10-CM | POA: Insufficient documentation

## 2012-03-16 NOTE — ED Provider Notes (Signed)
History  This chart was scribed for Joya Gaskins, MD by Manuela Schwartz, ED scribe. This patient was seen in room APA02/APA02 and the patient's care was started at 2343.   CSN: [id number]  Arrival date & time 03/16/12  2343   First MD Initiated Contact with Patient 03/16/12 2349      Chief Complaint  Patient presents with  . Cough  . Pleurisy   Patient is a 76 y.o. male presenting with cough and chest pain. The history is provided by the patient. No language interpreter was used.  Cough This is a new problem. The current episode started 1 to 2 hours ago. The problem occurs constantly. The problem has not changed since onset.The cough is productive of sputum. There has been no fever. Associated symptoms include chest pain. Pertinent negatives include no chills and no shortness of breath. He has tried nothing for the symptoms. He is a smoker.  Chest Pain The chest pain began 1 - 2 hours ago. The chest pain is unchanged. The pain is associated with coughing. The severity of the pain is moderate. The pain does not radiate. Primary symptoms include cough. Pertinent negatives for primary symptoms include no fever, no shortness of breath, no abdominal pain, no nausea and no vomiting.  Pertinent negatives for associated symptoms include no weakness.    [name] Graham is a 76 y.o. male who presents to the Emergency Department complaining of constant gradually worsening clear productive cough which worsened this PM with associated CP. He states over the past few days has worsened with a cough/congestion but tonight when he was getting ready for bed he began with some CP that seemed to worsen with coughing. He has a history of MI and he took x2 NTG at home without relief from his symptoms to he called EMS. He states his CP does not feel similar to prior MI. He states his CP is worse with coughing and he denies blood in his sputum. He has no hx of DVT/PE. He is a smoker.   Past Medical History    Diagnosis Date  . CAD (coronary artery disease)     BMS circ and PTCA PDA 2007 ,DES RCA 10/09, DES LAD 2/12  . COPD (chronic obstructive pulmonary disease)   . GERD (gastroesophageal reflux disease)   . Hyperlipidemia   . Hypertension   . Myocardial infarction     Past Surgical History  Procedure Date  . Coronary angioplasty with stent placement     No family history on file.  History  Substance Use Topics  . Smoking status: Current Every Day Smoker    Types: Cigarettes  . Smokeless tobacco: Never Used  . Alcohol Use: No      Review of Systems  Constitutional: Negative for fever and chills.  HENT: Positive for congestion.   Respiratory: Positive for cough. Negative for shortness of breath.   Cardiovascular: Positive for chest pain.  Gastrointestinal: Negative for nausea, vomiting and abdominal pain.  Musculoskeletal: Negative for back pain.  Neurological: Negative for weakness.  All other systems reviewed and are negative.    Allergies  Review of patient's allergies indicates no known allergies.  Home Medications   Current Outpatient Rx  Name  Route  Sig  Dispense  Refill  . ASPIRIN 325 MG PO TABS   Oral   Take 325 mg by mouth every evening.         Marland Kitchen CLOPIDOGREL BISULFATE 75 MG PO TABS   Oral  Take 1 tablet (75 mg total) by mouth daily.   30 tablet   12   . LISINOPRIL 40 MG PO TABS   Oral   Take 40 mg by mouth every morning.         Marland Kitchen METOPROLOL TARTRATE 25 MG PO TABS   Oral   Take 25 mg by mouth daily.         Marland Kitchen NITROGLYCERIN 0.4 MG SL SUBL   Sublingual   Place 1 tablet (0.4 mg total) under the tongue every 5 (five) minutes as needed.   25 tablet   6   . SIMVASTATIN 80 MG PO TABS   Oral   Take 1 tablet (80 mg total) by mouth at bedtime.   30 tablet   6   . ALBUTEROL SULFATE HFA 108 (90 BASE) MCG/ACT IN AERS   Inhalation   Inhale 1-2 puffs into the lungs every 6 (six) hours as needed for wheezing.   1 Inhaler   0   .  METOPROLOL SUCCINATE ER 25 MG PO TB24   Oral   Take 25 mg by mouth every morning.           Triage Vitals: BP 165/76  Pulse 74  Temp 98.1 F (36.7 C) (Oral)  Resp 24  Ht 5\' 10"  (1.778 m)  Wt 174 lb (78.926 kg)  BMI 24.97 kg/m2  SpO2 97%  Physical Exam CONSTITUTIONAL: Well developed/well nourished HEAD AND FACE: Normocephalic/atraumatic EYES: EOMI/PERRL ENMT: Mucous membranes moist NECK: supple no meningeal signs SPINE:entire spine nontender CV: S1/S2 noted, no murmurs/rubs/gallops noted Chest - nontender to palpation LUNGS: no distress noted, able to speak to me clearly, but brief crackles noted in left base ABDOMEN: soft, nontender, no rebound or guarding GU:no cva tenderness NEURO: Pt is awake/alert, moves all extremitiesx4 EXTREMITIES: pulses normal, full ROM SKIN: warm, color normal PSYCH: no abnormalities of mood noted  ED Course  Procedures   Diagnostic STUDIES: Oxygen Saturation is 97% on room air, normal by my interpretation.    COORDINATION OF CARE: At 1200 AM Discussed treatment plan with patient which includes CXR, EKG. Patient agrees.   On discussion, pt reports recent cough/congestion and reported chills worse last night.  He reports after several episodes of coughing he had this pain, it was only with cough and some deep breathing.  He took ASA/NTG without relief. He then called ambulance  He reports it is not like previous chest pain associated with MI.  He denies h/o VTE.  He is well appearing, ambulatory and no distress/no hypoxia.  Suspect he may have early pneumonia given cough, abnormal CXR and crackles on exam.  Doubt PE, he is well appearing and pain does not radiate and he feels improved.  The pain was very localized to left lower chest with cough Will treat as outpatient, advised to quit smoking and we discussed strict return precautions  MDM  Nursing notes including past medical history and social history reviewed and considered in  documentation xrays reviewed and considered Previous records reviewed and considered - h/o CAD      Date: 03/17/2012  Rate: 85  Rhythm: normal sinus rhythm  QRS Axis: normal  Intervals: normal  ST/T Wave abnormalities: nonspecific ST changes  Conduction Disutrbances:none  Narrative Interpretation:   Old EKG Reviewed: unchanged     I personally performed the services described in this documentation, which was scribed in my presence. The recorded information has been reviewed and is accurate.  Joya Gaskins, MD 03/17/12 [phone]

## 2012-03-16 NOTE — ED Notes (Signed)
Patient presents to ER via EMS with c/o chest pain that is worse with coughing.

## 2012-03-17 ENCOUNTER — Emergency Department (HOSPITAL_COMMUNITY): Payer: Medicare Other

## 2012-03-17 MED ORDER — DOXYCYCLINE HYCLATE 100 MG PO CAPS: 100.0000 mg | ORAL_CAPSULE | Freq: Two times a day (BID) | ORAL | Status: DC

## 2012-03-17 MED ORDER — ALBUTEROL SULFATE HFA 108 (90 BASE) MCG/ACT IN AERS
2.0000 | INHALATION_SPRAY | Freq: Once | RESPIRATORY_TRACT | Status: AC
  Administered 2012-03-17: 2 via RESPIRATORY_TRACT
  Filled 2012-03-17: qty 6.7

## 2012-03-17 MED ORDER — DOXYCYCLINE HYCLATE 100 MG PO TABS
100.0000 mg | ORAL_TABLET | Freq: Once | ORAL | Status: AC
  Administered 2012-03-17: 100 mg via ORAL
  Filled 2012-03-17: qty 1

## 2012-03-17 NOTE — ED Notes (Signed)
Ambulated patient with no assistance. Gait steady. O2 saturation during ambulation maintained at 97% - 98% on room air. Patient able to talk during ambulation; however, respirations slightly labored during ambulation.

## 2012-03-23 ENCOUNTER — Other Ambulatory Visit: Payer: Self-pay | Admitting: Cardiology

## 2012-04-11 ENCOUNTER — Encounter (HOSPITAL_COMMUNITY): Payer: Self-pay | Admitting: *Deleted

## 2012-04-11 ENCOUNTER — Emergency Department (HOSPITAL_COMMUNITY)
Admission: EM | Admit: 2012-04-11 | Discharge: 2012-04-11 | Disposition: A | Discharge status: Home / Self Care | Payer: Medicare Other | Attending: Emergency Medicine | Admitting: Emergency Medicine

## 2012-04-11 ENCOUNTER — Emergency Department (HOSPITAL_COMMUNITY): Payer: Medicare Other

## 2012-04-11 DIAGNOSIS — E785 Hyperlipidemia, unspecified: Secondary | ICD-10-CM | POA: Insufficient documentation

## 2012-04-11 DIAGNOSIS — I10 Essential (primary) hypertension: Secondary | ICD-10-CM | POA: Insufficient documentation

## 2012-04-11 DIAGNOSIS — K219 Gastro-esophageal reflux disease without esophagitis: Secondary | ICD-10-CM | POA: Insufficient documentation

## 2012-04-11 DIAGNOSIS — I252 Old myocardial infarction: Secondary | ICD-10-CM | POA: Insufficient documentation

## 2012-04-11 DIAGNOSIS — S62606B Fracture of unspecified phalanx of right little finger, initial encounter for open fracture: Secondary | ICD-10-CM

## 2012-04-11 DIAGNOSIS — Y9389 Activity, other specified: Secondary | ICD-10-CM | POA: Insufficient documentation

## 2012-04-11 DIAGNOSIS — F17200 Nicotine dependence, unspecified, uncomplicated: Secondary | ICD-10-CM | POA: Insufficient documentation

## 2012-04-11 DIAGNOSIS — I2510 Atherosclerotic heart disease of native coronary artery without angina pectoris: Secondary | ICD-10-CM | POA: Insufficient documentation

## 2012-04-11 DIAGNOSIS — J4489 Other specified chronic obstructive pulmonary disease: Secondary | ICD-10-CM | POA: Insufficient documentation

## 2012-04-11 DIAGNOSIS — S6710XA Crushing injury of unspecified finger(s), initial encounter: Secondary | ICD-10-CM | POA: Insufficient documentation

## 2012-04-11 DIAGNOSIS — Y929 Unspecified place or not applicable: Secondary | ICD-10-CM | POA: Insufficient documentation

## 2012-04-11 DIAGNOSIS — J449 Chronic obstructive pulmonary disease, unspecified: Secondary | ICD-10-CM | POA: Insufficient documentation

## 2012-04-11 DIAGNOSIS — W312XXA Contact with powered woodworking and forming machines, initial encounter: Secondary | ICD-10-CM | POA: Insufficient documentation

## 2012-04-11 LAB — CBC WITH DIFFERENTIAL/PLATELET
Eosinophils Absolute: 0.1 10*3/uL (ref 0.0–0.7)
Eosinophils Relative: 2 % (ref 0–5)
Hemoglobin: 15.7 g/dL (ref 13.0–17.0)
Lymphs Abs: 1.9 10*3/uL (ref 0.7–4.0)
MCH: 32.6 pg (ref 26.0–34.0)
MCHC: 35.4 g/dL (ref 30.0–36.0)
MCV: 92.3 fL (ref 78.0–100.0)
Monocytes Relative: 5 % (ref 3–12)
RBC: 4.81 MIL/uL (ref 4.22–5.81)

## 2012-04-11 MED ORDER — LIDOCAINE HCL (PF) 1 % IJ SOLN
INTRAMUSCULAR | Status: AC
  Filled 2012-04-11: qty 5

## 2012-04-11 MED ORDER — CEFAZOLIN SODIUM 1 G IJ SOLR
1.0000 g | Freq: Once | INTRAMUSCULAR | Status: AC
  Administered 2012-04-11: 1 g via INTRAMUSCULAR
  Filled 2012-04-11: qty 10

## 2012-04-11 MED ORDER — CEFAZOLIN SODIUM 1-5 GM-% IV SOLN: 1.0000 g | Freq: Once | INTRAVENOUS | Status: DC

## 2012-04-11 MED ORDER — CEPHALEXIN 500 MG PO CAPS: 500.0000 mg | ORAL_CAPSULE | Freq: Four times a day (QID) | ORAL | Status: DC

## 2012-04-11 MED ORDER — OXYCODONE-ACETAMINOPHEN 5-325 MG PO TABS: 2.0000 | ORAL_TABLET | ORAL | Status: DC | PRN

## 2012-04-11 MED ORDER — LIDOCAINE-EPINEPHRINE (PF) 1 %-1:200000 IJ SOLN
INTRAMUSCULAR | Status: AC
  Filled 2012-04-11: qty 10

## 2012-04-11 MED ORDER — LIDOCAINE HCL 1 % IJ SOLN
15.0000 mL | Freq: Once | INTRAMUSCULAR | Status: DC
  Filled 2012-04-11: qty 15

## 2012-04-11 MED ORDER — OXYCODONE-ACETAMINOPHEN 5-325 MG PO TABS
2.0000 | ORAL_TABLET | Freq: Once | ORAL | Status: AC
  Administered 2012-04-11: 2 via ORAL
  Filled 2012-04-11: qty 2

## 2012-04-11 MED ORDER — TETANUS-DIPHTH-ACELL PERTUSSIS 5-2.5-18.5 LF-MCG/0.5 IM SUSP
0.5000 mL | Freq: Once | INTRAMUSCULAR | Status: AC
  Administered 2012-04-11: 0.5 mL via INTRAMUSCULAR
  Filled 2012-04-11: qty 0.5

## 2012-04-11 NOTE — ED Notes (Signed)
ERMD at bedside, cleaning and anesthetizing wound. Pt medicated as noted.

## 2012-04-11 NOTE — ED Provider Notes (Signed)
History   This chart was scribed for [name] Octave, MD by Sofie Rower, ED Scribe. The patient was seen in room APA07/APA07 and the patient's care was started at 6:43PM.     CSN: [id number]  Arrival date & time 04/11/12  1624   First MD Initiated Contact with Patient 04/11/12 1843      Chief Complaint  Patient presents with  . Extremity Laceration    (Consider location/radiation/quality/duration/timing/severity/associated sxs/prior treatment) The history is provided by the patient. No language interpreter was used.    [name] Graham is a 76 y.o. male who presents to the Emergency Department complaining of  sudden, moderate, laceration located at the right 5th digit, onset today (04/11/12 at 2:00PM). The pt reports he cut his right 5th finger earlier this afternoon while splitting wood. The pt informs he is taking blood thinners at present. The pt has applied a bandage dressing to the right 5th finger which provides moderate relief of pain and controls the bleeding from the digit. Modifying factors include certain movements and positions of the right 5th finger which intensifies the pain associated with the laceration. The pt is unaware of the date of his last tetanus immunization.  The pt is a current everyday smoker, however, he does not drink alcohol.   PCP is Dr. Diona Browner.     Past Medical History  Diagnosis Date  . CAD (coronary artery disease)     BMS circ and PTCA PDA 2007 ,DES RCA 10/09, DES LAD 2/12  . COPD (chronic obstructive pulmonary disease)   . GERD (gastroesophageal reflux disease)   . Hyperlipidemia   . Hypertension   . Myocardial infarction     Past Surgical History  Procedure Date  . Coronary angioplasty with stent placement     No family history on file.  History  Substance Use Topics  . Smoking status: Current Every Day Smoker    Types: Cigarettes  . Smokeless tobacco: Never Used  . Alcohol Use: No      Review of Systems  10 Systems  reviewed and all are negative for acute change except as noted in the HPI.    Allergies  Review of patient's allergies indicates no known allergies.  Home Medications   Current Outpatient Rx  Name  Route  Sig  Dispense  Refill  . ALBUTEROL SULFATE HFA 108 (90 BASE) MCG/ACT IN AERS   Inhalation   Inhale 1-2 puffs into the lungs every 6 (six) hours as needed for wheezing.   1 Inhaler   0   . ASPIRIN 325 MG PO TABS   Oral   Take 325 mg by mouth every evening.         Marland Kitchen CLOPIDOGREL BISULFATE 75 MG PO TABS   Oral   Take 1 tablet (75 mg total) by mouth daily.   30 tablet   12   . DOXYCYCLINE HYCLATE 100 MG PO CAPS   Oral   Take 1 capsule (100 mg total) by mouth 2 (two) times daily.   20 capsule   0   . LISINOPRIL 40 MG PO TABS   Oral   Take 40 mg by mouth every morning.         Marland Kitchen METOPROLOL SUCCINATE ER 25 MG PO TB24   Oral   Take 25 mg by mouth every morning.         Marland Kitchen METOPROLOL TARTRATE 25 MG PO TABS   Oral   Take 25 mg by mouth daily.         Marland Kitchen  METOPROLOL TARTRATE 25 MG PO TABS      TAKE 1 TABLET EVERY DAY   30 tablet   8   . NITROGLYCERIN 0.4 MG SL SUBL   Sublingual   Place 1 tablet (0.4 mg total) under the tongue every 5 (five) minutes as needed.   25 tablet   6   . SIMVASTATIN 80 MG PO TABS   Oral   Take 1 tablet (80 mg total) by mouth at bedtime.   30 tablet   6     BP 155/74  Pulse 67  Temp 97.9 F (36.6 C) (Oral)  Resp 20  Ht 5\' 10"  (1.778 m)  Wt 165 lb (74.844 kg)  BMI 23.68 kg/m2  SpO2 98%  Physical Exam  Nursing note and vitals reviewed. Constitutional: He is oriented to person, place, and time. He appears well-developed and well-nourished. No distress.  HENT:  Head: Atraumatic.  Nose: Nose normal.  Eyes: EOM are normal.  Neck: Normal range of motion.  Musculoskeletal:       Arms:      Right hand: He exhibits laceration.       Hands:      U shaped laceration to the palmer aspect of the 5th distal phalanx. FDS, FDP  intact. Laceration that is irregular extending from base of nail to distal phalanx.  Neurological: He is alert and oriented to person, place, and time.  Skin: Skin is warm and dry. He is not diaphoretic.  Psychiatric: He has a normal mood and affect. His behavior is normal.    ED Course  LACERATION REPAIR Performed by: [name] Graham Authorized by: [name] Graham Consent: Verbal consent obtained. Risks and benefits: risks, benefits and alternatives were discussed Consent given by: patient Patient identity confirmed: verbally with patient Time out: Immediately prior to procedure a "time out" was called to verify the correct patient, procedure, equipment, support staff and site/side marked as required. Body area: upper extremity Location details: right small finger Laceration length: 3 cm Contamination: The wound is contaminated. Tendon involvement: none Nerve involvement: none Vascular damage: no Anesthesia: digital block Local anesthetic: lidocaine 1% without epinephrine Anesthetic total: 6 ml Patient sedated: no Preparation: Patient was prepped and draped in the usual sterile fashion. Irrigation solution: tap water Irrigation method: syringe Amount of cleaning: extensive Skin closure: 4-0 nylon Number of sutures: 3 Technique: simple Approximation: loose Patient tolerance: Patient tolerated the procedure well with no immediate complications. Comments: Temporary partial closure to slow bleeding   (including critical care time)  DIAGNOSTIC STUDIES: Oxygen Saturation is 98% on room air, normal by my interpretation.    COORDINATION OF CARE:   6:52 PM- Treatment plan concerning laceration repair discussed with patient. Pt agrees with treatment.  7:06 PM- Recheck. Laceration repair conducted. Treatment plan discussed with patient. Pt agrees with treatment.       Results for orders placed during the hospital encounter of 04/11/12  CBC WITH DIFFERENTIAL       Component Value Range   WBC 7.3  4.0 - 10.5 K/uL   RBC 4.81  4.22 - 5.81 MIL/uL   Hemoglobin 15.7  13.0 - 17.0 g/dL   HCT 03.4  74.2 - 59.5 %   MCV 92.3  78.0 - 100.0 fL   MCH 32.6  26.0 - 34.0 pg   MCHC 35.4  30.0 - 36.0 g/dL   RDW 63.8  75.6 - 43.3 %   Platelets 175  150 - 400 K/uL   Neutrophils Relative 67  43 -  77 %   Neutro Abs 4.9  1.7 - 7.7 K/uL   Lymphocytes Relative 25  12 - 46 %   Lymphs Abs 1.9  0.7 - 4.0 K/uL   Monocytes Relative 5  3 - 12 %   Monocytes Absolute 0.4  0.1 - 1.0 K/uL   Eosinophils Relative 2  0 - 5 %   Eosinophils Absolute 0.1  0.0 - 0.7 K/uL   Basophils Relative 1  0 - 1 %   Basophils Absolute 0.1  0.0 - 0.1 K/uL     Dg Finger Little Right  04/11/2012  *RADIOLOGY REPORT*  Clinical Data: Extremity laceration  RIGHT LITTLE FINGER 2+V  Comparison: None.  Findings: Comminuted intra-articular fracture involving the base of the fifth distal phalanx.  Overlying soft tissue irregularity/laceration.  Multiple tiny radiopaque foci, likely reflecting foreign bodies.  IMPRESSION: Comminuted intra-articular fracture involving the fifth distal phalanx.  Overlying soft tissue irregularity/laceration with multiple tiny radiopaque foreign bodies.   Original Report Authenticated By: Charline Bills, M.D.       No diagnosis found.    MDM   Patient's sustain crush injury to right fifth finger from wood splitter. He takes aspirin and Plavix. There is a large irregular shaped use straight laceration to the palm are surface of the distal phalanx. There is active bleeding from a laceration dorsal side of the same finger. FDS, FDP, extension intact. Crepitance palpable.   Open distal phlanax fracture with significant soft tissue damage. D/w Dr. Amanda Pea.  He would like to see patient tonight to do debridement.  Will transfer to CDU at [facility] by private vehicle.  Tetanus updated and ancef given.  I personally performed the services described in this documentation, which was  scribed in my presence. The recorded information has been reviewed and is accurate.   [name] Octave, MD 04/12/12 [phone]

## 2012-04-11 NOTE — ED Provider Notes (Signed)
Patient transferred from [facility] ED to see Dr. Amanda Pea for the evaluation of open fracture of right 5th finger.  Dr. Amanda Pea at bedside upon patient's arrival.  Patient ambulatory into department, no acute distress.  No active bleeding from finger wound.  Jimmye Norman, NP 04/11/12 2233

## 2012-04-11 NOTE — Consult Note (Signed)
  See Dictation # [id number] Dominica Severin MD

## 2012-04-11 NOTE — ED Notes (Signed)
Hand Surgeon Dr Cliffton Asters at bedside.

## 2012-04-11 NOTE — ED Notes (Signed)
Pt A.O. X 4. Verbalized understanding of medication admin. Verbalized need to follow up with Dr Cliffton Asters. Ambulatory. Denies SOB. Denies Pain. Son at bedside will drive pt home. No further needs at this time.

## 2012-04-11 NOTE — ED Notes (Signed)
Reports deep laceration to right 5th digit - states was cut with wood splitter; pt states he takes blood thinners; arrives with pressure dsg in place with bleeding controlled; dressing not removed in triage.

## 2012-04-12 NOTE — ED Provider Notes (Signed)
Medical screening examination/treatment/procedure(s) were performed by non-physician practitioner and as supervising physician I was immediately available for consultation/collaboration.  Randee Huston T Cathleen Yagi, MD 04/12/12 1555 

## 2012-04-12 NOTE — Op Note (Signed)
NAMEMarland Kitchen  JEREMIAN, WHITBY NO.:  [id number]  MEDICAL RECORD NO.:  [id number]  LOCATION:  [facility]                         FACILITY:  [facility]  PHYSICIAN:  [name] Graham, M.D.DATE OF BIRTH:  1935/07/30  DATE OF PROCEDURE: DATE OF DISCHARGE:                              OPERATIVE REPORT   [name] Graham presents for evaluation.  He was kindly referred by Dr. Manus Graham at [facility].  He was transferred down with open injury to his right small finger.  He had wood splitting injury today. His tetanus and Ancef has been updated.  He notes no prior injury or pain.  He is here today with his son.  He has host of multiple medical problems.  I have counseled him in regard to risks and benefits of surgery, etc.  His main issues in an open fracture about the distal phalanx with severe devitalized soft tissue.  Past medical and surgical history has been reviewed.  He has hyperlipidemia tobacco, abuse, essential hypertension, coronary artery disease, and COPD.  ALLERGIES:  None.  MEDICINES:  Reviewed in his chart at great length.  He is here today with his son.  The patient is alert and oriented, in no acute distress vital signs stable.  Patient has full sensation to the lower extremities.  He is noted to have a nontender abdomen.  Chest was clear.  HEENT within normal limits.  His right hand has a wood splitting injury to the small finger with exposed phalanx and a volar flap with questionable viability dorsally also severe soft tissue contusive injury.  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  IMPRESSION:  Wood splitting injury with open distal phalanx fracture and soft tissue avulsion injury.  PLAN:  I have discussed with him his findings.  He was taken to procedure suite, underwent intermetacarpal block, prepped and draped in a sterile fashion Betadine scrub and paint.  This was an excisional debridement  of skin, subcutaneous tissue, bone, tendon, and deep structures, scissor curette, and knife blade was used for this.  Three liter were placed through and through.  Following I and D, I then performed open treatment of his distal phalanx fracture. Following this, I performed a volar advancement flap of the volar laceration.  This all had very questionable viability.  I discussed with the patient these issues and I made it very known to him that he may ultimately lose the index finger given the viability issues. Nevertheless, we were able to reconstruct the volar and dorsal wounds to my satisfaction.  This was a volar advancement type flap.  The patient underwent I and D, open treatment of a distal phalanx fracture, volar advancement flap, and a dorsal 2 cm laceration repair which was complex in nature given the nail bed in the vicinity.  The patient tolerated this well.  He was dressed sterilely with Adaptic, Xeroform, gauze, splint, and Coban.  He is going to see me back in 4 days.  I have discussed with him all issues at present time, he is stable.  He is awake alert and oriented, and able to be discharged.  We will see him in week  in our office.  He will call for an appointment.  FINAL DISCHARGE DIAGNOSIS:  Open fracture secondary to wood splitting injury to right small finger, status post I and D and reconstruction.  MEDICATIONS:  Keflex 500 q.i.d. and oxycodone 5 mg 1 p.o. q.4-6 hours p.r.n. pain.     [name] Graham, M.D.     [facility]  D:  04/11/2012  T:  04/12/2012  Job:  [id number]

## 2012-05-23 ENCOUNTER — Ambulatory Visit (INDEPENDENT_AMBULATORY_CARE_PROVIDER_SITE_OTHER): Payer: Medicare Other | Admitting: Cardiology

## 2012-05-23 ENCOUNTER — Encounter: Payer: Self-pay | Admitting: Cardiology

## 2012-05-23 VITALS — BP 140/80 | HR 62 | Ht 70.0 in | Wt 157.0 lb

## 2012-05-23 DIAGNOSIS — I2510 Atherosclerotic heart disease of native coronary artery without angina pectoris: Secondary | ICD-10-CM

## 2012-05-23 DIAGNOSIS — E785 Hyperlipidemia, unspecified: Secondary | ICD-10-CM

## 2012-05-23 DIAGNOSIS — F17200 Nicotine dependence, unspecified, uncomplicated: Secondary | ICD-10-CM

## 2012-05-23 DIAGNOSIS — E782 Mixed hyperlipidemia: Secondary | ICD-10-CM

## 2012-05-23 NOTE — Patient Instructions (Addendum)
Your physician recommends that you schedule a follow-up appointment in: 6 MONTHS WITH SM  Your physician recommends that you return for lab work in: PRIOR TO 6 MONTH OV (WE WILL MAIL YOU A REMINDER LETTER TO COME IN TO HAVE YOUR LABS DRAWN

## 2012-05-23 NOTE — Assessment & Plan Note (Signed)
Lipids have been very well controlled. Followup FLP and LFT for next visit.

## 2012-05-23 NOTE — Assessment & Plan Note (Signed)
Symptomatically stable on medical therapy. Continue observation. No changes made today.

## 2012-05-23 NOTE — Progress Notes (Signed)
   Clinical Summary [name] Graham is a 77 y.o.male presenting for followup. I last saw him in March 2013. He had an interval visit with Ms. Lawrence NP in June 2013.  He reports no angina symptoms, no progressive shortness of breath. States that he has been compliant with his medications.  Lab work from September 2013 reviewed finding cholesterol 132, triglycerides of 66, HDL 48, LDL 71, normal LFTs.  He had an accident with a wood splitter while repairing the motor in his shop, had a severe laceration of his right fifth finger. Still having this evaluated by a specialist.  No Known Allergies  Current Outpatient Prescriptions  Medication Sig Dispense Refill  . albuterol (PROVENTIL HFA;VENTOLIN HFA) 108 (90 BASE) MCG/ACT inhaler Inhale 1-2 puffs into the lungs every 6 (six) hours as needed for wheezing.  1 Inhaler  0  . aspirin 325 MG tablet Take 325 mg by mouth 2 (two) times daily.       . cephALEXin (KEFLEX) 500 MG capsule Take 1 capsule (500 mg total) by mouth 4 (four) times daily.  56 capsule  0  . clopidogrel (PLAVIX) 75 MG tablet Take 75 mg by mouth every morning.      Marland Kitchen lisinopril (PRINIVIL,ZESTRIL) 40 MG tablet Take 40 mg by mouth every morning.       . metoprolol tartrate (LOPRESSOR) 25 MG tablet Take 25 mg by mouth every morning.       . nitroGLYCERIN (NITROSTAT) 0.4 MG SL tablet Place 1 tablet (0.4 mg total) under the tongue every 5 (five) minutes as needed.  25 tablet  6  . oxyCODONE-acetaminophen (ROXICET) 5-325 MG per tablet Take 2 tablets by mouth every 4 (four) hours as needed for pain.  55 tablet  0  . simvastatin (ZOCOR) 80 MG tablet Take 1 tablet (80 mg total) by mouth at bedtime.  30 tablet  6    Past Medical History  Diagnosis Date  . CAD (coronary artery disease)     BMS circ and PTCA PDA 2007 ,DES RCA 10/09, DES LAD 2/12  . COPD (chronic obstructive pulmonary disease)   . GERD (gastroesophageal reflux disease)   . Hyperlipidemia   . Hypertension   . Myocardial  infarction     Social History [name] Graham reports that he has been smoking Cigarettes.  He has never used smokeless tobacco. [name] Graham reports that he does not drink alcohol.  Review of Systems No palpitations or syncope. No orthopnea or PND. Stable appetite. Otherwise negative.  Physical Examination Filed Vitals:   05/23/12 1449  BP: 140/80  Pulse: 62   Filed Weights   05/23/12 1449  Weight: 157 lb (71.215 kg)    Normally nourished appearing male in no acute distress.  HEENT: Conjunctiva and lids normal, oropharynx with moist mucosa, poor dentition.  Neck: Supple, no carotid bruits or elevated JVP.  Lungs: Clear with diminished breath sounds, nonlabored.  Cardiac: Regular rate and rhythm, no significant systolic murmur or S3.  Abdomen: Soft, nontender, bowel sounds present.  Extremities: No pitting edema legs.     Problem List and Plan   CORONARY ATHEROSCLEROSIS NATIVE CORONARY ARTERY Symptomatically stable on medical therapy. Continue observation. No changes made today.  HYPERLIPIDEMIA Lipids have been very well controlled. Followup FLP and LFT for next visit.  TOBACCO ABUSE Has not been able to quit smoking.    Jonelle Sidle, M.D., F.A.C.C.

## 2012-05-23 NOTE — Assessment & Plan Note (Signed)
Has not been able to quit smoking.

## 2012-06-26 ENCOUNTER — Telehealth: Payer: Self-pay | Admitting: Cardiology

## 2012-06-26 ENCOUNTER — Other Ambulatory Visit: Payer: Self-pay | Admitting: Cardiology

## 2012-06-26 MED ORDER — LISINOPRIL 40 MG PO TABS: 40.0000 mg | ORAL_TABLET | Freq: Every morning | ORAL | Status: DC

## 2012-06-26 NOTE — Telephone Encounter (Signed)
rx sent to pharmacy by e-script  

## 2012-06-26 NOTE — Telephone Encounter (Signed)
Needs RX for Lisinopril sent to [facility] in [place]. / tgs

## 2012-07-24 ENCOUNTER — Other Ambulatory Visit: Payer: Self-pay | Admitting: Cardiology

## 2012-08-29 ENCOUNTER — Telehealth: Payer: Self-pay | Admitting: Cardiology

## 2012-08-29 NOTE — Telephone Encounter (Signed)
PER PHARMACY SIMVASTATIN NEEDS PRE-AUTHERIZATION

## 2012-09-01 ENCOUNTER — Telehealth: Payer: Self-pay | Admitting: Cardiology

## 2012-09-01 MED ORDER — SIMVASTATIN 80 MG PO TABS: 80.0000 mg | ORAL_TABLET | Freq: Every day | ORAL | Status: DC

## 2012-09-01 NOTE — Telephone Encounter (Signed)
rx sent to pharmacy by e-script  

## 2012-09-01 NOTE — Telephone Encounter (Signed)
Needs refill on Simvastatin approved to [facility] [place]/ tgs

## 2012-11-21 ENCOUNTER — Other Ambulatory Visit: Payer: Self-pay | Admitting: *Deleted

## 2012-11-21 DIAGNOSIS — E782 Mixed hyperlipidemia: Secondary | ICD-10-CM

## 2012-11-22 ENCOUNTER — Ambulatory Visit (INDEPENDENT_AMBULATORY_CARE_PROVIDER_SITE_OTHER): Payer: Medicare Other | Admitting: Cardiology

## 2012-11-22 ENCOUNTER — Encounter: Payer: Self-pay | Admitting: Cardiology

## 2012-11-22 VITALS — BP 137/80 | HR 78 | Ht 70.0 in | Wt 150.5 lb

## 2012-11-22 DIAGNOSIS — J449 Chronic obstructive pulmonary disease, unspecified: Secondary | ICD-10-CM

## 2012-11-22 DIAGNOSIS — I2510 Atherosclerotic heart disease of native coronary artery without angina pectoris: Secondary | ICD-10-CM

## 2012-11-22 DIAGNOSIS — F17200 Nicotine dependence, unspecified, uncomplicated: Secondary | ICD-10-CM

## 2012-11-22 DIAGNOSIS — E785 Hyperlipidemia, unspecified: Secondary | ICD-10-CM

## 2012-11-22 LAB — LIPID PANEL: Cholesterol: 154 mg/dL (ref 0–200)

## 2012-11-22 MED ORDER — ALBUTEROL SULFATE HFA 108 (90 BASE) MCG/ACT IN AERS: 1.0000 | INHALATION_SPRAY | Freq: Four times a day (QID) | RESPIRATORY_TRACT | Status: DC | PRN

## 2012-11-22 NOTE — Patient Instructions (Addendum)
Your physician recommends that you schedule a follow-up appointment in: 6 MONTHS WITH DR SM  WE WILL CALL YOU WITH YOUR LAB RESULTS  YOUR RX FOR YOUR INHALER HAS BEEN SENT PER DR SM

## 2012-11-22 NOTE — Assessment & Plan Note (Signed)
Patient has not been able to quit.

## 2012-11-22 NOTE — Assessment & Plan Note (Signed)
Refill given for Proventil.

## 2012-11-22 NOTE — Assessment & Plan Note (Signed)
Symptomatically stable on medical therapy. ECG is normal. Continue medications and observation. Followup arranged.

## 2012-11-22 NOTE — Assessment & Plan Note (Signed)
Followup on lipid panel drawn this morning.

## 2012-11-22 NOTE — Progress Notes (Signed)
   Clinical Summary [name] is a 77 y.o.male last seen in January. He reports no angina symptoms. States that he has been compliant with his medications. Reports no bleeding episodes on aspirin and Plavix.  He had followup lipids and LFTs drawn this morning, results pending. ECG today shows normal sinus rhythm, no significant ST segment changes.  He reports stable dyspnea on exertion, has run out of his Proventil inhaler however.   No Known Allergies  Current Outpatient Prescriptions  Medication Sig Dispense Refill  . aspirin 325 MG tablet Take 325 mg by mouth 2 (two) times daily.       Marland Kitchen albuterol (PROVENTIL HFA;VENTOLIN HFA) 108 (90 BASE) MCG/ACT inhaler Inhale 1-2 puffs into the lungs every 6 (six) hours as needed for wheezing.  1 Inhaler  1  . clopidogrel (PLAVIX) 75 MG tablet Take 75 mg by mouth every morning.      Marland Kitchen lisinopril (PRINIVIL,ZESTRIL) 40 MG tablet Take 1 tablet (40 mg total) by mouth every morning.  30 tablet  5  . metoprolol tartrate (LOPRESSOR) 25 MG tablet Take 25 mg by mouth every morning.       . nitroGLYCERIN (NITROSTAT) 0.4 MG SL tablet Place 1 tablet (0.4 mg total) under the tongue every 5 (five) minutes as needed.  25 tablet  6  . simvastatin (ZOCOR) 80 MG tablet Take 1 tablet (80 mg total) by mouth at bedtime.  30 tablet  3   No current facility-administered medications for this visit.    Past Medical History  Diagnosis Date  . CAD (coronary artery disease)     BMS circ and PTCA PDA 2007 ,DES RCA 10/09, DES LAD 2/12  . COPD (chronic obstructive pulmonary disease)   . GERD (gastroesophageal reflux disease)   . Hyperlipidemia   . Hypertension   . Myocardial infarction     Social History [name] reports that he has been smoking Cigarettes.  He has been smoking about 0.00 packs per day. He has never used smokeless tobacco. [name] reports that he does not drink alcohol.  Review of Systems Chronic neck pain and stiffness. Residual right  fifth finger numbness after traumatic injury. Otherwise negative.  Physical Examination Filed Vitals:   11/22/12 1452  BP: 137/80  Pulse: 78   Filed Weights   11/22/12 1452  Weight: 150 lb 8 oz (68.266 kg)    Normally nourished appearing male in no acute distress.  HEENT: Conjunctiva and lids normal, oropharynx with moist mucosa, poor dentition.  Neck: Supple, no carotid bruits or elevated JVP.  Lungs: Clear with diminished breath sounds, nonlabored.  Cardiac: Regular rate and rhythm, no significant systolic murmur or S3.  Abdomen: Soft, nontender, bowel sounds present.  Extremities: No pitting edema legs.    Problem List and Plan   CORONARY ATHEROSCLEROSIS NATIVE CORONARY ARTERY Symptomatically stable on medical therapy. ECG is normal. Continue medications and observation. Followup arranged.  COPD Refill given for Proventil.  HYPERLIPIDEMIA Followup on lipid panel drawn this morning.  TOBACCO ABUSE Patient has not been able to quit.    Jonelle Sidle, M.D., F.A.C.C.

## 2012-11-23 ENCOUNTER — Encounter: Payer: Self-pay | Admitting: *Deleted

## 2012-11-23 LAB — HEPATIC FUNCTION PANEL
ALT: 12 U/L (ref 0–53)
AST: 16 U/L (ref 0–37)
Albumin: 4.1 g/dL (ref 3.5–5.2)
Total Protein: 6.6 g/dL (ref 6.0–8.3)

## 2012-11-30 ENCOUNTER — Other Ambulatory Visit: Payer: Self-pay | Admitting: Cardiology

## 2013-01-03 ENCOUNTER — Other Ambulatory Visit: Payer: Self-pay | Admitting: Cardiology

## 2013-01-04 ENCOUNTER — Telehealth: Payer: Self-pay | Admitting: *Deleted

## 2013-01-04 MED ORDER — METOPROLOL TARTRATE 25 MG PO TABS: ORAL_TABLET | ORAL | Status: DC

## 2013-01-04 MED ORDER — LISINOPRIL 40 MG PO TABS: 40.0000 mg | ORAL_TABLET | Freq: Every morning | ORAL | Status: DC

## 2013-01-04 NOTE — Telephone Encounter (Signed)
[facility] [place] please [facility] in lisinopril ans metoprolol pt ran out yesterday and was told [facility] was [facility]

## 2013-01-04 NOTE — Telephone Encounter (Signed)
Medication sent via escribe.  

## 2013-01-08 ENCOUNTER — Other Ambulatory Visit: Payer: Self-pay | Admitting: *Deleted

## 2013-01-08 MED ORDER — LISINOPRIL 40 MG PO TABS: 40.0000 mg | ORAL_TABLET | Freq: Every morning | ORAL | Status: DC

## 2013-03-01 ENCOUNTER — Other Ambulatory Visit: Payer: Self-pay | Admitting: Cardiology

## 2013-05-16 ENCOUNTER — Ambulatory Visit (INDEPENDENT_AMBULATORY_CARE_PROVIDER_SITE_OTHER): Payer: Managed Care, Other (non HMO) | Admitting: Cardiology

## 2013-05-16 ENCOUNTER — Encounter: Payer: Self-pay | Admitting: Cardiology

## 2013-05-16 VITALS — BP 147/70 | HR 63 | Ht 70.0 in | Wt 156.0 lb

## 2013-05-16 DIAGNOSIS — E782 Mixed hyperlipidemia: Secondary | ICD-10-CM

## 2013-05-16 DIAGNOSIS — I2510 Atherosclerotic heart disease of native coronary artery without angina pectoris: Secondary | ICD-10-CM

## 2013-05-16 DIAGNOSIS — F17200 Nicotine dependence, unspecified, uncomplicated: Secondary | ICD-10-CM

## 2013-05-16 DIAGNOSIS — Z0000 Encounter for general adult medical examination without abnormal findings: Secondary | ICD-10-CM

## 2013-05-16 DIAGNOSIS — Z23 Encounter for immunization: Secondary | ICD-10-CM

## 2013-05-16 DIAGNOSIS — I10 Essential (primary) hypertension: Secondary | ICD-10-CM

## 2013-05-16 NOTE — Assessment & Plan Note (Signed)
Have encouraged smoking cessation over time, he has not been able to quit.

## 2013-05-16 NOTE — Assessment & Plan Note (Signed)
Lipids have been well controlled over time, continues on Zocor. We will reassess FLP and LFT for next visit in 6 months.

## 2013-05-16 NOTE — Assessment & Plan Note (Signed)
Patient tells me that he plans to establish with Dr. Edrick Oh for primary care. I told that I would provide records.

## 2013-05-16 NOTE — Assessment & Plan Note (Signed)
Symptomatically stable on medical therapy, most recent intervention being DES to the LAD in 2012. Continue observation. He continues on long-term DAPT in light of multiple interventions over time.

## 2013-05-16 NOTE — Patient Instructions (Signed)
Your physician wants you to follow-up in: 6 months You will receive a reminder letter in the mail two months in advance. If you don't receive a letter, please call our office to schedule the follow-up appointment.  Your physician recommends that you return for lab work in: 6 months ([place] THIS TEST) GO ONE WEEK PRIOR TO YOUR OFFICE VISIT IN 6 MONTHS  Your physician recommends THAT YOU ESTABLISH WITH DR Edrick Oh, PLEASE CONTACT THEIR OFFICE AT [phone] WE WILL FAX OVER OUR CURRENT RECORDS TO HIS OFFICE TODAY

## 2013-05-16 NOTE — Assessment & Plan Note (Signed)
No change to current regimen. 

## 2013-05-16 NOTE — Progress Notes (Signed)
Clinical Summary Mr. Dorce is a 78 y.o.male last seen in July 2014. He has been stable from a cardiac perspective, no angina symptoms, no cardiac hospitalizations. Medications are reviewed below. He reports compliance and no bleeding problems on DAPT.  Lab work from July 2014 showed normal LFTs, cholesterol 154, triglycerides 127, HDL 53, LDL 76. And he has had no interval changes and Zocor, tolerating it well.  He states that he would like to get an influenza shot today, has had no recent cold or flulike symptoms.  He also tells that he plans to establish primary care with Dr. Edrick Oh, has had no PCP for many years   No Known Allergies  Current Outpatient Prescriptions  Medication Sig Dispense Refill  . albuterol (PROVENTIL HFA;VENTOLIN HFA) 108 (90 BASE) MCG/ACT inhaler Inhale 1-2 puffs into the lungs every 6 (six) hours as needed for wheezing.  1 Inhaler  1  . aspirin 325 MG tablet Take 325 mg by mouth 2 (two) times daily.       . clopidogrel (PLAVIX) 75 MG tablet TAKE 1 TABLET BY MOUTH EVERY DAY  30 tablet  11  . lisinopril (PRINIVIL,ZESTRIL) 40 MG tablet Take 1 tablet (40 mg total) by mouth every morning.  30 tablet  5  . metoprolol tartrate (LOPRESSOR) 25 MG tablet TAKE 1 TABLET EVERY DAY  30 tablet  8  . nitroGLYCERIN (NITROSTAT) 0.4 MG SL tablet Place 1 tablet (0.4 mg total) under the tongue every 5 (five) minutes as needed.  25 tablet  6  . simvastatin (ZOCOR) 80 MG tablet Take 1 tablet (80 mg total) by mouth at bedtime.  30 tablet  3   No current facility-administered medications for this visit.    Past Medical History  Diagnosis Date  . Coronary atherosclerosis of native coronary artery     BMS circ and PTCA PDA 2007, DES RCA 10/09, DES LAD 2/12  . COPD (chronic obstructive pulmonary disease)   . GERD (gastroesophageal reflux disease)   . Hyperlipidemia   . Essential hypertension, benign   . NSTEMI (non-ST elevated myocardial infarction)     2007    History  reviewed. No pertinent past surgical history.   Family History  Problem Relation Age of Onset  . Heart attack Mother   . Cancer Father     Social History Mr. Steinke reports that he has been smoking Cigarettes.  He has been smoking about 0.00 packs per day. He has never used smokeless tobacco. Mr. Torbeck reports that he does not drink alcohol.  Review of Systems No palpitations, dizziness, syncope. No memory changes or focal motor weakness. Reports stable appetite. Otherwise as outlined above.  Physical Examination Filed Vitals:   05/16/13 1025  BP: 147/70  Pulse: 63   Filed Weights   05/16/13 1025  Weight: 156 lb (70.761 kg)    Normally nourished appearing male in no acute distress.  HEENT: Conjunctiva and lids normal, oropharynx with moist mucosa, poor dentition.  Neck: Supple, no carotid bruits or elevated JVP.  Lungs: Clear with diminished breath sounds, nonlabored.  Cardiac: Regular rate and rhythm, no significant systolic murmur or S3.  Abdomen: Soft, nontender, bowel sounds present.  Extremities: No pitting edema legs.  Skin: Warm and dry. Musculoskeletal: No kyphosis. Neuropsychiatric: Alert and oriented x3, affect appropriate.   Problem List and Plan   CORONARY ATHEROSCLEROSIS NATIVE CORONARY ARTERY Symptomatically stable on medical therapy, most recent intervention being DES to the LAD in 2012. Continue observation. He continues on  long-term DAPT in light of multiple interventions over time.  Essential hypertension, benign No change to current regimen.  HYPERLIPIDEMIA Lipids have been well controlled over time, continues on Zocor. We will reassess FLP and LFT for next visit in 6 months.  TOBACCO ABUSE Have encouraged smoking cessation over time, he has not been able to quit.  Healthcare maintenance Patient tells me that he plans to establish with Dr. Edrick Oh for primary care. I told that I would provide records.    Satira Sark, M.D.,  F.A.C.C.

## 2013-06-12 ENCOUNTER — Ambulatory Visit: Payer: Self-pay | Admitting: Family Medicine

## 2013-06-15 ENCOUNTER — Ambulatory Visit (INDEPENDENT_AMBULATORY_CARE_PROVIDER_SITE_OTHER): Payer: Medicare HMO | Admitting: Family Medicine

## 2013-06-15 ENCOUNTER — Encounter (INDEPENDENT_AMBULATORY_CARE_PROVIDER_SITE_OTHER): Payer: Self-pay

## 2013-06-15 ENCOUNTER — Encounter: Payer: Self-pay | Admitting: Family Medicine

## 2013-06-15 ENCOUNTER — Ambulatory Visit (INDEPENDENT_AMBULATORY_CARE_PROVIDER_SITE_OTHER): Payer: Medicare HMO

## 2013-06-15 ENCOUNTER — Telehealth: Payer: Self-pay | Admitting: Family Medicine

## 2013-06-15 VITALS — BP 140/78 | HR 54 | Temp 97.0°F | Ht 69.0 in | Wt 158.4 lb

## 2013-06-15 DIAGNOSIS — I10 Essential (primary) hypertension: Secondary | ICD-10-CM

## 2013-06-15 DIAGNOSIS — Z720 Tobacco use: Secondary | ICD-10-CM

## 2013-06-15 DIAGNOSIS — R5381 Other malaise: Secondary | ICD-10-CM

## 2013-06-15 DIAGNOSIS — F17200 Nicotine dependence, unspecified, uncomplicated: Secondary | ICD-10-CM

## 2013-06-15 DIAGNOSIS — Z139 Encounter for screening, unspecified: Secondary | ICD-10-CM

## 2013-06-15 DIAGNOSIS — R5383 Other fatigue: Secondary | ICD-10-CM

## 2013-06-15 DIAGNOSIS — E785 Hyperlipidemia, unspecified: Secondary | ICD-10-CM

## 2013-06-15 DIAGNOSIS — K1370 Unspecified lesions of oral mucosa: Secondary | ICD-10-CM

## 2013-06-15 LAB — POCT CBC
Granulocyte percent: 63.9 %G (ref 37–80)
HCT, POC: 48.2 % (ref 43.5–53.7)
Hemoglobin: 15.9 g/dL (ref 14.1–18.1)
Lymph, poc: 1.9 (ref 0.6–3.4)
MCH, POC: 31.8 pg — AB (ref 27–31.2)
MCHC: 32.9 g/dL (ref 31.8–35.4)
MCV: 96.6 fL (ref 80–97)
MPV: 7.7 fL (ref 0–99.8)
POC Granulocyte: 4.2 (ref 2–6.9)
POC LYMPH PERCENT: 28.9 %L (ref 10–50)
Platelet Count, POC: 173 10*3/uL (ref 142–424)
RBC: 5 M/uL (ref 4.69–6.13)
RDW, POC: 13.2 %
WBC: 6.5 10*3/uL (ref 4.6–10.2)

## 2013-06-15 MED ORDER — MAGIC MOUTHWASH: 5.0000 mL | Freq: Four times a day (QID) | ORAL | Status: DC | PRN

## 2013-06-15 NOTE — Progress Notes (Signed)
   Subjective:    Patient ID: [name] Graham, male    DOB: Feb 01, 1936, 78 y.o.   MRN: [id number]  HPI  This 78 y.o. male presents for evaluation of establishment visit.  He has hx of CAD and has 7 coronary artery stents. He has been seeing Dr. Domenic Polite Cardiologist every 6 months.  He has not had a CPE in over 6 years.  He has an oral lesion on his right mouth for 5 days.  He states his father died from Oral cancer.  Review of Systems C/o oral lesion   No chest pain, SOB, HA, dizziness, vision change, N/V, diarrhea, constipation, dysuria, urinary urgency or frequency, myalgias, arthralgias or rash.  Objective:   Physical Exam  Vital signs noted  Well developed well nourished male.  HEENT - Head atraumatic Normocephalic                Eyes - PERRLA, Conjuctiva - clear Sclera- Clear EOMI                Ears - EAC's Wnl TM's Wnl Gross Hearing WNL                Nose - Nares patent                 Throat - oropharanx wnl Respiratory - Lungs CTA bilateral Cardiac - RRR S1 and S2 without murmur GI - Abdomen soft Nontender and bowel sounds active x 4 Extremities - No edema. Neuro - Grossly intact.      Assessment & Plan:  Screening - Plan: PSA, total and free, CANCELED: PSA, total and free, CANCELED: Ambulatory referral to Gastroenterology  Fatigue - Plan: Vit D  25 hydroxy (rtn osteoporosis monitoring), CANCELED: POCT CBC, CANCELED: Vit D  25 hydroxy (rtn osteoporosis monitoring), CANCELED: Thyroid Panel With TSH  Hyperlipemia - Plan: CMP14+EGFR, Lipid panel, POCT CBC, CANCELED: POCT CBC, CANCELED: CMP14+EGFR  Tobacco abuse - Plan: DG Chest 2 View  Oral mucosal lesion - Plan: POCT CBC, Alum & Mag Hydroxide-Simeth (MAGIC MOUTHWASH) SOLN, CANCELED: CMP14+EGFR  Essential hypertension, benign - Plan: POCT CBC, TSH, Vit D  25 hydroxy (rtn osteoporosis monitoring), CANCELED: CMP14+EGFR  Lysbeth Penner FNP

## 2013-06-16 LAB — LIPID PANEL
Chol/HDL Ratio: 2.4 ratio units (ref 0.0–5.0)
Cholesterol, Total: 144 mg/dL (ref 100–199)
HDL: 61 mg/dL (ref >39)
LDL Calculated: 69 mg/dL (ref 0–99)
Triglycerides: 68 mg/dL (ref 0–149)
VLDL Cholesterol Cal: 14 mg/dL (ref 5–40)

## 2013-06-16 LAB — CMP14+EGFR
ALT: 12 IU/L (ref 0–44)
AST: 16 IU/L (ref 0–40)
Albumin/Globulin Ratio: 2 (ref 1.1–2.5)
Albumin: 4.4 g/dL (ref 3.5–4.8)
Alkaline Phosphatase: 47 IU/L (ref 39–117)
BUN/Creatinine Ratio: 19 (ref 10–22)
BUN: 18 mg/dL (ref 8–27)
CO2: 25 mmol/L (ref 18–29)
Calcium: 9.4 mg/dL (ref 8.6–10.2)
Chloride: 102 mmol/L (ref 97–108)
Creatinine, Ser: 0.97 mg/dL (ref 0.76–1.27)
GFR calc Af Amer: 88 mL/min/{1.73_m2} (ref >59)
GFR calc non Af Amer: 76 mL/min/{1.73_m2} (ref >59)
Globulin, Total: 2.2 g/dL (ref 1.5–4.5)
Glucose: 83 mg/dL (ref 65–99)
Potassium: 4.6 mmol/L (ref 3.5–5.2)
Sodium: 141 mmol/L (ref 134–144)
Total Bilirubin: 0.4 mg/dL (ref 0.0–1.2)
Total Protein: 6.6 g/dL (ref 6.0–8.5)

## 2013-06-16 LAB — PSA, TOTAL AND FREE
PSA, Free Pct: 35 %
PSA, Free: 0.84 ng/mL
PSA: 2.4 ng/mL (ref 0.0–4.0)

## 2013-06-16 LAB — TSH: TSH: 4.39 u[IU]/mL (ref 0.450–4.500)

## 2013-06-16 LAB — VITAMIN D 25 HYDROXY (VIT D DEFICIENCY, FRACTURES): Vit D, 25-Hydroxy: 19.2 ng/mL — ABNORMAL LOW (ref 30.0–100.0)

## 2013-06-21 NOTE — Telephone Encounter (Signed)
[name] Graham talked to pharmacy

## 2013-07-04 ENCOUNTER — Other Ambulatory Visit: Payer: Self-pay | Admitting: Cardiology

## 2013-07-09 ENCOUNTER — Telehealth: Payer: Self-pay | Admitting: Family Medicine

## 2013-07-09 NOTE — Telephone Encounter (Signed)
Spoke with pt and he verbalized the mouth ulcer not sore and only bothers him when he closes his mouth  "it feels like his gums are pinching it" . States he can feel the ulcer but can not see it. States this has been ongoing on since Feb and concerned about it Does he need refill on mouthwash or something else?

## 2013-07-10 ENCOUNTER — Other Ambulatory Visit: Payer: Self-pay | Admitting: Family Medicine

## 2013-07-10 DIAGNOSIS — K121 Other forms of stomatitis: Secondary | ICD-10-CM

## 2013-07-10 NOTE — Telephone Encounter (Signed)
PT AWARE AND ADVISED TO CALL BACK ON Friday IF NOT HEARD BACK FROM OFFICE

## 2013-07-10 NOTE — Telephone Encounter (Signed)
Referred to oral surgery

## 2013-10-10 ENCOUNTER — Other Ambulatory Visit: Payer: Self-pay | Admitting: Cardiology

## 2013-10-23 ENCOUNTER — Emergency Department (HOSPITAL_COMMUNITY)
Admission: EM | Admit: 2013-10-23 | Discharge: 2013-10-23 | Disposition: A | Discharge status: Home / Self Care | Payer: Medicare HMO | Attending: Emergency Medicine | Admitting: Emergency Medicine

## 2013-10-23 ENCOUNTER — Encounter (HOSPITAL_COMMUNITY): Payer: Self-pay | Admitting: Emergency Medicine

## 2013-10-23 DIAGNOSIS — Z7902 Long term (current) use of antithrombotics/antiplatelets: Secondary | ICD-10-CM | POA: Insufficient documentation

## 2013-10-23 DIAGNOSIS — Z7982 Long term (current) use of aspirin: Secondary | ICD-10-CM | POA: Insufficient documentation

## 2013-10-23 DIAGNOSIS — J449 Chronic obstructive pulmonary disease, unspecified: Secondary | ICD-10-CM | POA: Insufficient documentation

## 2013-10-23 DIAGNOSIS — F17200 Nicotine dependence, unspecified, uncomplicated: Secondary | ICD-10-CM | POA: Insufficient documentation

## 2013-10-23 DIAGNOSIS — I252 Old myocardial infarction: Secondary | ICD-10-CM | POA: Insufficient documentation

## 2013-10-23 DIAGNOSIS — I2510 Atherosclerotic heart disease of native coronary artery without angina pectoris: Secondary | ICD-10-CM | POA: Insufficient documentation

## 2013-10-23 DIAGNOSIS — Z79899 Other long term (current) drug therapy: Secondary | ICD-10-CM | POA: Insufficient documentation

## 2013-10-23 DIAGNOSIS — Z862 Personal history of diseases of the blood and blood-forming organs and certain disorders involving the immune mechanism: Secondary | ICD-10-CM | POA: Insufficient documentation

## 2013-10-23 DIAGNOSIS — Z8719 Personal history of other diseases of the digestive system: Secondary | ICD-10-CM | POA: Insufficient documentation

## 2013-10-23 DIAGNOSIS — H9210 Otorrhea, unspecified ear: Secondary | ICD-10-CM | POA: Insufficient documentation

## 2013-10-23 DIAGNOSIS — Z8639 Personal history of other endocrine, nutritional and metabolic disease: Secondary | ICD-10-CM | POA: Insufficient documentation

## 2013-10-23 DIAGNOSIS — J4489 Other specified chronic obstructive pulmonary disease: Secondary | ICD-10-CM | POA: Insufficient documentation

## 2013-10-23 DIAGNOSIS — H9221 Otorrhagia, right ear: Secondary | ICD-10-CM

## 2013-10-23 DIAGNOSIS — I10 Essential (primary) hypertension: Secondary | ICD-10-CM | POA: Insufficient documentation

## 2013-10-23 NOTE — Discharge Instructions (Signed)
Follow up with Dr. Benjamine Mola if not improving

## 2013-10-23 NOTE — ED Notes (Signed)
Pt has bleeding from right ear after removing a large amount of wax. On Clopidogrel. No active bleeding.

## 2013-10-23 NOTE — ED Provider Notes (Signed)
CSN: [id number]     Arrival date & time 10/23/13  4034 History  This chart was scribed for [name] Diego, MD by Ludger Nutting, ED Scribe. This patient was seen in room APA07/APA07 and the patient's care was started 8:42 AM.    Chief Complaint  Patient presents with  . Ear Drainage    Patient is a 78 y.o. male presenting with ear drainage. The history is provided by the patient. No language interpreter was used.  Ear Drainage This is a new problem. The current episode started 6 to 12 hours ago. The problem occurs constantly. The problem has not changed since onset.Pertinent negatives include no chest pain, no abdominal pain and no headaches. Nothing aggravates the symptoms. Nothing relieves the symptoms. He has tried nothing for the symptoms.    HPI Comments: [name] Graham is a 78 y.o. male who presents to the Emergency Department complaining of an episode of right ear bleeding that began last night. Patient states he removed a large amount of ear wax with plastic tweezers and a few hours later he noted bleeding. He states the bleeding has continued and is unchanged since onset last night. He states he is currently taking Plavix. He denies hearing loss, HA, fever.   Past Medical History  Diagnosis Date  . Coronary atherosclerosis of native coronary artery     BMS circ and PTCA PDA 2007, DES RCA 10/09, DES LAD 2/12  . COPD (chronic obstructive pulmonary disease)   . GERD (gastroesophageal reflux disease)   . Hyperlipidemia   . Essential hypertension, benign   . NSTEMI (non-ST elevated myocardial infarction)     2007   History reviewed. No pertinent past surgical history. Family History  Problem Relation Age of Onset  . Heart attack Mother   . Cancer Father    History  Substance Use Topics  . Smoking status: Current Every Day Smoker -- 1.00 packs/day    Types: Cigarettes    Start date: 06/15/1953  . Smokeless tobacco: Never Used     Comment: 1 pack a day  . Alcohol Use: No     Review of Systems  Constitutional: Negative for appetite change and fatigue.  HENT: Positive for ear discharge (bleeding from right ear). Negative for congestion and sinus pressure.   Eyes: Negative for discharge.  Respiratory: Negative for cough.   Cardiovascular: Negative for chest pain.  Gastrointestinal: Negative for abdominal pain and diarrhea.  Genitourinary: Negative for frequency and hematuria.  Musculoskeletal: Negative for back pain.  Skin: Negative for rash.  Neurological: Negative for seizures and headaches.  Psychiatric/Behavioral: Negative for hallucinations.      Allergies  Review of patient's allergies indicates no known allergies.  Home Medications   Prior to Admission medications   Medication Sig Start Date End Date Taking? Authorizing Provider  albuterol (PROVENTIL HFA;VENTOLIN HFA) 108 (90 BASE) MCG/ACT inhaler Inhale 1-2 puffs into the lungs every 6 (six) hours as needed for wheezing. 11/22/12 11/22/13  Satira Sark, MD  Alum & Mag Hydroxide-Simeth (MAGIC MOUTHWASH) SOLN Take 5 mLs by mouth 4 (four) times daily as needed for mouth pain. 06/15/13   Lysbeth Penner, FNP  aspirin 325 MG tablet Take 325 mg by mouth 2 (two) times daily.     Historical Provider, MD  clopidogrel (PLAVIX) 75 MG tablet TAKE 1 TABLET BY MOUTH EVERY DAY 11/30/12   Satira Sark, MD  lisinopril (PRINIVIL,ZESTRIL) 40 MG tablet Take 1 tablet (40 mg total) by mouth every morning. 01/08/13  Satira Sark, MD  metoprolol tartrate (LOPRESSOR) 25 MG tablet TAKE 1 TABLET EVERY DAY 01/04/13   Satira Sark, MD  NITROSTAT 0.4 MG SL tablet PLACE 1 TABLET (0.4 MG TOTAL) UNDER THE TONGUE EVERY 5 (FIVE) MINUTES AS NEEDED. 07/04/13   Satira Sark, MD  simvastatin (ZOCOR) 80 MG tablet Take 1 tablet (80 mg total) by mouth at bedtime. 09/01/12   Satira Sark, MD  simvastatin (ZOCOR) 80 MG tablet TAKE 1 TABLET BY MOUTH AT BEDTIME 10/10/13   Satira Sark, MD   BP 175/96  Pulse 63   Temp(Src) 97.4 F (36.3 C) (Oral)  Resp 18  SpO2 98% Physical Exam  Nursing note and vitals reviewed. Constitutional: He is oriented to person, place, and time. He appears well-developed.  HENT:  Head: Normocephalic.  Right Ear: Tympanic membrane normal.  Irritation to the right ear canal with some bleeding. Right TM is intact.   Eyes: Conjunctivae are normal.  Neck: No tracheal deviation present.  Cardiovascular:  No murmur heard. Musculoskeletal: Normal range of motion.  Neurological: He is oriented to person, place, and time.  Skin: Skin is warm.  Psychiatric: He has a normal mood and affect.    ED Course  Procedures (including critical care time)  DIAGNOSTIC STUDIES: Oxygen Saturation is 98% on RA, normal by my interpretation.    COORDINATION OF CARE: 8:45 AM Discussed treatment plan with pt at bedside and pt agreed to plan.   Labs Review Labs Reviewed - No data to display  Imaging Review No results found.   EKG Interpretation None      MDM   Final diagnoses:  None   The chart was scribed for me under my direct supervision.  I personally performed the history, physical, and medical decision making and all procedures in the evaluation of this patient.[name] Diego, MD 10/23/13 [phone]

## 2013-10-23 NOTE — ED Notes (Signed)
Patient with no complaints at this time. Respirations even and unlabored. Skin warm/dry. Discharge instructions reviewed with patient at this time. Patient given opportunity to voice concerns/ask questions. Patient discharged at this time and left Emergency Department with steady gait.   

## 2013-11-08 ENCOUNTER — Ambulatory Visit (INDEPENDENT_AMBULATORY_CARE_PROVIDER_SITE_OTHER): Payer: Medicare HMO | Admitting: Cardiology

## 2013-11-08 ENCOUNTER — Encounter: Payer: Self-pay | Admitting: Cardiology

## 2013-11-08 VITALS — BP 126/82 | HR 62 | Ht 70.0 in | Wt 151.0 lb

## 2013-11-08 DIAGNOSIS — E785 Hyperlipidemia, unspecified: Secondary | ICD-10-CM

## 2013-11-08 DIAGNOSIS — I10 Essential (primary) hypertension: Secondary | ICD-10-CM

## 2013-11-08 DIAGNOSIS — I2510 Atherosclerotic heart disease of native coronary artery without angina pectoris: Secondary | ICD-10-CM

## 2013-11-08 NOTE — Progress Notes (Signed)
Clinical Summary Mr. Krontz is a 78 y.o.male last seen in January of this year. He continues to do well from a cardiac perspective without significant angina symptoms. Still operates a Surveyor, mining. He reports no difficulties with his medications. ECG today shows sinus bradycardia with PVC, leftward axis, R. prime in lead V1 and V2.  Lab work from February showed cholesterol 144, triglycerides of 68, HDL 61, and LDL 69. LFTs were normal. He is tolerating Zocor.  No Known Allergies  Current Outpatient Prescriptions  Medication Sig Dispense Refill  . albuterol (PROVENTIL HFA;VENTOLIN HFA) 108 (90 BASE) MCG/ACT inhaler Inhale 1-2 puffs into the lungs every 6 (six) hours as needed for wheezing.  1 Inhaler  1  . aspirin 325 MG tablet Take 325 mg by mouth 2 (two) times daily.       . clopidogrel (PLAVIX) 75 MG tablet TAKE 1 TABLET BY MOUTH EVERY DAY  30 tablet  11  . lisinopril (PRINIVIL,ZESTRIL) 40 MG tablet Take 1 tablet (40 mg total) by mouth every morning.  30 tablet  5  . metoprolol tartrate (LOPRESSOR) 25 MG tablet TAKE 1 TABLET EVERY DAY  30 tablet  8  . NITROSTAT 0.4 MG SL tablet PLACE 1 TABLET (0.4 MG TOTAL) UNDER THE TONGUE EVERY 5 (FIVE) MINUTES AS NEEDED.  25 tablet  2  . simvastatin (ZOCOR) 80 MG tablet Take 1 tablet (80 mg total) by mouth at bedtime.  30 tablet  3   No current facility-administered medications for this visit.    Past Medical History  Diagnosis Date  . Coronary atherosclerosis of native coronary artery     BMS circ and PTCA PDA 2007, DES RCA 10/09, DES LAD 2/12  . COPD (chronic obstructive pulmonary disease)   . GERD (gastroesophageal reflux disease)   . Hyperlipidemia   . Essential hypertension, benign   . NSTEMI (non-ST elevated myocardial infarction)     2007    History reviewed. No pertinent past surgical history.  Family History  Problem Relation Age of Onset  . Heart attack Mother   . Cancer Father     Social History Mr.  Broad reports that he has been smoking Cigarettes.  He started smoking about 60 years ago. He has been smoking about 1.00 pack per day. He has never used smokeless tobacco. Mr. Biel reports that he does not drink alcohol.  Review of Systems No palpitations, dizziness, bleeding problems. Stable appetite. No orthopnea or PND. No claudication. Arthritic "aches and pains." Other systems reviewed negative.  Physical Examination Filed Vitals:   11/08/13 1011  BP: 126/82  Pulse: 62   Filed Weights   11/08/13 1011  Weight: 151 lb (68.493 kg)    Normally nourished appearing male in no acute distress.  HEENT: Conjunctiva and lids normal, oropharynx with moist mucosa, poor dentition.  Neck: Supple, no carotid bruits or elevated JVP.  Lungs: Clear with diminished breath sounds, nonlabored.  Cardiac: Regular rate and rhythm, no significant systolic murmur or S3.  Abdomen: Soft, nontender, bowel sounds present.  Extremities: No pitting edema legs.  Skin: Warm and dry.  Musculoskeletal: No kyphosis.  Neuropsychiatric: Alert and oriented x3, affect appropriate.   Problem List and Plan   CORONARY ATHEROSCLEROSIS NATIVE CORONARY ARTERY Symptomatically stable on medical therapy. ECG reviewed. Continue observation, followup in 6 months.  HYPERLIPIDEMIA Last LDL 69 on Zocor. Followup FLP and LFT for next visit.  Essential hypertension, benign Blood pressure control is good today. No changes made.  Satira Sark, M.D., F.A.C.C.

## 2013-11-08 NOTE — Patient Instructions (Signed)
Your physician wants you to follow-up in: 6 months You will receive a reminder letter in the mail two months in advance. If you don't receive a letter, please call our office to schedule the follow-up appointment.    Your physician recommends that you continue on your current medications as directed. Please refer to the Current Medication list given to you today.    Please get blood work (LIPID,LIVER FUNCTION)  1 week before next visit     Thank you for choosing [facility] !

## 2013-11-08 NOTE — Assessment & Plan Note (Signed)
Symptomatically stable on medical therapy. ECG reviewed. Continue observation, followup in 6 months.

## 2013-11-08 NOTE — Assessment & Plan Note (Signed)
Last LDL 69 on Zocor. Followup FLP and LFT for next visit.

## 2013-11-08 NOTE — Assessment & Plan Note (Signed)
Blood pressure control is good today. No changes made.

## 2013-12-14 ENCOUNTER — Other Ambulatory Visit: Payer: Self-pay | Admitting: Cardiology

## 2014-01-18 ENCOUNTER — Other Ambulatory Visit: Payer: Self-pay | Admitting: *Deleted

## 2014-01-18 MED ORDER — METOPROLOL TARTRATE 25 MG PO TABS: ORAL_TABLET | ORAL | Status: DC

## 2014-01-18 MED ORDER — LISINOPRIL 40 MG PO TABS: 40.0000 mg | ORAL_TABLET | Freq: Every morning | ORAL | Status: DC

## 2014-04-10 ENCOUNTER — Ambulatory Visit (INDEPENDENT_AMBULATORY_CARE_PROVIDER_SITE_OTHER): Payer: Medicare HMO | Admitting: Family Medicine

## 2014-04-10 ENCOUNTER — Encounter: Payer: Self-pay | Admitting: Family Medicine

## 2014-04-10 VITALS — BP 155/74 | HR 64 | Temp 96.8°F | Ht 70.0 in | Wt 155.6 lb

## 2014-04-10 DIAGNOSIS — J206 Acute bronchitis due to rhinovirus: Secondary | ICD-10-CM

## 2014-04-10 MED ORDER — METHYLPREDNISOLONE ACETATE 80 MG/ML IJ SUSP
80.0000 mg | Freq: Once | INTRAMUSCULAR | Status: AC
  Administered 2014-04-10: 80 mg via INTRAMUSCULAR

## 2014-04-10 MED ORDER — BENZONATATE 100 MG PO CAPS: 100.0000 mg | ORAL_CAPSULE | Freq: Three times a day (TID) | ORAL | Status: DC | PRN

## 2014-04-10 MED ORDER — AZITHROMYCIN 250 MG PO TABS: ORAL_TABLET | ORAL | Status: DC

## 2014-04-10 NOTE — Progress Notes (Signed)
   Subjective:    Patient ID: [name] Graham, male    DOB: 1935-11-08, 78 y.o.   MRN: [id number]  HPI Patient is here for c/o uri sx's and cough.  Review of Systems  Constitutional: Negative for fever.  HENT: Negative for ear pain.   Eyes: Negative for discharge.  Respiratory: Negative for cough.   Cardiovascular: Negative for chest pain.  Gastrointestinal: Negative for abdominal distention.  Endocrine: Negative for polyuria.  Genitourinary: Negative for difficulty urinating.  Musculoskeletal: Negative for gait problem and neck pain.  Skin: Negative for color change and rash.  Neurological: Negative for speech difficulty and headaches.  Psychiatric/Behavioral: Negative for agitation.       Objective:    BP 155/74 mmHg  Pulse 64  Temp(Src) 96.8 F (36 C) (Oral)  Ht 5\' 10"  (1.778 m)  Wt 155 lb 9.6 oz (70.58 kg)  BMI 22.33 kg/m2 Physical Exam        Assessment & Plan:     ICD-9-CM ICD-10-CM   1. Acute bronchitis due to Rhinovirus 466.0 J20.6 azithromycin (ZITHROMAX) 250 MG tablet   079.3  methylPREDNISolone acetate (DEPO-MEDROL) injection 80 mg     benzonatate (TESSALON PERLES) 100 MG capsule   Push po fluids, rest, tylenol and motrin otc prn as directed for fever, arthralgias, and myalgias.  Follow up prn if sx's continue or persist.  Return if symptoms worsen or fail to improve.  Lysbeth Penner FNP

## 2014-04-11 ENCOUNTER — Telehealth: Payer: Self-pay | Admitting: Family Medicine

## 2014-04-11 MED ORDER — NEOMYCIN-POLYMYXIN-HC 1 % OT SOLN: 3.0000 [drp] | Freq: Four times a day (QID) | OTIC | Status: DC

## 2014-04-11 NOTE — Telephone Encounter (Signed)
Pt was in to see Bill yesterday and was told ear drops rx would be sent to pharmacy, Bill forgot to send the ear drops per Zigmund Daniel, ear drops rx sent to pharmacy, pt aware. Will close call.

## 2014-05-09 ENCOUNTER — Telehealth: Payer: Self-pay | Admitting: *Deleted

## 2014-05-09 ENCOUNTER — Other Ambulatory Visit: Payer: Self-pay | Admitting: Cardiology

## 2014-05-09 LAB — HEPATIC FUNCTION PANEL
ALT: 13 U/L (ref 0–53)
AST: 16 U/L (ref 0–37)
Albumin: 3.9 g/dL (ref 3.5–5.2)
Alkaline Phosphatase: 46 U/L (ref 39–117)
BILIRUBIN INDIRECT: 0.4 mg/dL (ref 0.2–1.2)
BILIRUBIN TOTAL: 0.5 mg/dL (ref 0.2–1.2)
Bilirubin, Direct: 0.1 mg/dL (ref 0.0–0.3)
Total Protein: 6.5 g/dL (ref 6.0–8.3)

## 2014-05-09 LAB — LIPID PANEL
Cholesterol: 138 mg/dL (ref 0–200)
HDL: 55 mg/dL (ref >39)
LDL CALC: 65 mg/dL (ref 0–99)
Total CHOL/HDL Ratio: 2.5 Ratio
Triglycerides: 92 mg/dL (ref <150)
VLDL: 18 mg/dL (ref 0–40)

## 2014-05-09 NOTE — Telephone Encounter (Signed)
Pt made aware, forwarded to Dr. Laurance Flatten. Confirmed appt with Dr. Domenic Polite on Tueday

## 2014-05-09 NOTE — Telephone Encounter (Signed)
-----   Message from Satira Sark, MD sent at 05/09/2014  3:59 PM EST ----- Reviewed. Cholesterol looks good with LDL 65, LFTs are normal.

## 2014-05-14 ENCOUNTER — Encounter: Payer: Self-pay | Admitting: *Deleted

## 2014-05-14 ENCOUNTER — Encounter: Payer: Self-pay | Admitting: Cardiology

## 2014-05-14 ENCOUNTER — Ambulatory Visit (INDEPENDENT_AMBULATORY_CARE_PROVIDER_SITE_OTHER): Payer: Medicare HMO | Admitting: Cardiology

## 2014-05-14 VITALS — BP 142/80 | HR 56 | Ht 70.0 in | Wt 154.2 lb

## 2014-05-14 DIAGNOSIS — I2510 Atherosclerotic heart disease of native coronary artery without angina pectoris: Secondary | ICD-10-CM

## 2014-05-14 DIAGNOSIS — F17200 Nicotine dependence, unspecified, uncomplicated: Secondary | ICD-10-CM

## 2014-05-14 DIAGNOSIS — E782 Mixed hyperlipidemia: Secondary | ICD-10-CM

## 2014-05-14 DIAGNOSIS — I25810 Atherosclerosis of coronary artery bypass graft(s) without angina pectoris: Secondary | ICD-10-CM

## 2014-05-14 DIAGNOSIS — Z720 Tobacco use: Secondary | ICD-10-CM

## 2014-05-14 NOTE — Assessment & Plan Note (Signed)
Continues on Zocor, good lipid control as noted above.

## 2014-05-14 NOTE — Assessment & Plan Note (Signed)
We have discussed smoking cessation, he has not been able to quit.

## 2014-05-14 NOTE — Assessment & Plan Note (Signed)
He has been symptomatically stable on medical therapy as outlined status post prior percutaneous interventions to the circumflex, PDA, RCA, and most recently LAD in 2012. Plan is to follow-up with a Lexiscan Cardiolite to reassess ischemic burden. Unless significantly abnormal, we will continue observation and follow-up in 6 months.

## 2014-05-14 NOTE — Progress Notes (Signed)
Reason for visit: CAD, hyperlipidemia  Clinical Summary [name] Graham is a 79 y.o.male last seen in July 2015. He reports no angina symptoms on current medical therapy including aspirin, Plavix, lisinopril, Zocor and Lopressor. 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 He reports no unusual bleeding problems on DAPT.   Recent lab work shows cholesterol 138, triglycerides 92, HDL 55, LDL 65, and normal LFTs. Reports no problems with Zocor. He has been on this long-term.  Last coronary intervention was in 2012. We discussed arranging a follow-up stress test to reassess ischemic burden.  No Known Allergies  Current Outpatient Prescriptions  Medication Sig Dispense Refill  . aspirin 325 MG tablet Take 325 mg by mouth 2 (two) times daily.     . clopidogrel (PLAVIX) 75 MG tablet TAKE 1 TABLET BY MOUTH EVERY DAY 30 tablet 11  . lisinopril (PRINIVIL,ZESTRIL) 40 MG tablet Take 1 tablet (40 mg total) by mouth every morning. 30 tablet 6  . metoprolol tartrate (LOPRESSOR) 25 MG tablet TAKE 1 TABLET EVERY DAY 30 tablet 6  . NITROSTAT 0.4 MG SL tablet PLACE 1 TABLET (0.4 MG TOTAL) UNDER THE TONGUE EVERY 5 (FIVE) MINUTES AS NEEDED. 25 tablet 2  . simvastatin (ZOCOR) 80 MG tablet Take 1 tablet (80 mg total) by mouth at bedtime. 30 tablet 3  . albuterol (PROVENTIL HFA;VENTOLIN HFA) 108 (90 BASE) MCG/ACT inhaler Inhale 1-2 puffs into the lungs every 6 (six) hours as needed for wheezing. 1 Inhaler 1   No current facility-administered medications for this visit.    Past Medical History  Diagnosis Date  . Coronary atherosclerosis of native coronary artery     BMS circ and PTCA PDA 2007, DES RCA 10/09, DES LAD 2/12  . COPD (chronic obstructive pulmonary disease)   . GERD (gastroesophageal reflux disease)   . Hyperlipidemia   . Essential hypertension, benign   . NSTEMI (non-ST elevated myocardial infarction)     2007    Social History [name] Graham reports that he has been smoking  Cigarettes.  He started smoking about 60 years ago. He has been smoking about 1.00 pack per day. He has never used smokeless tobacco. [name] Graham reports that he does not drink alcohol.  Review of Systems Complete review of systems negative except as otherwise outlined in the clinical summary and also the following. No palpitations, dizziness, syncope. Has been having trouble with increased flatus. Also hand and leg arthritis.  Physical Examination Filed Vitals:   05/14/14 1111  BP: 142/80  Pulse: 56   Filed Weights   05/14/14 1111  Weight: 154 lb 3.2 oz (69.945 kg)    Normally nourished appearing male in no acute distress.  HEENT: Conjunctiva and lids normal, oropharynx with moist mucosa, poor dentition.  Neck: Supple, no carotid bruits or elevated JVP.  Lungs: Clear with diminished breath sounds, nonlabored.  Cardiac: Regular rate and rhythm, no significant systolic murmur or S3.  Abdomen: Soft, nontender, bowel sounds present.  Extremities: No pitting edema legs.  Skin: Warm and dry.  Musculoskeletal: No kyphosis.  Neuropsychiatric: Alert and oriented x3, affect appropriate.   Problem List and Plan   CORONARY ATHEROSCLEROSIS NATIVE CORONARY ARTERY He has been symptomatically stable on medical therapy as outlined status post prior percutaneous interventions to the circumflex, PDA, RCA, and most recently LAD in 2012. Plan is to follow-up with a Lexiscan Cardiolite to reassess ischemic burden. Unless significantly abnormal, we will continue observation and follow-up in 6 months.  TOBACCO ABUSE We  have discussed smoking cessation, he has not been able to quit.  Mixed hyperlipidemia Continues on Zocor, good lipid control as noted above.    Satira Sark, M.D., F.A.C.C.

## 2014-05-14 NOTE — Patient Instructions (Signed)
Your physician wants you to follow-up in: 6 months with Dr. Domenic Polite. You will receive a reminder letter in the mail two months in advance. If you don't receive a letter, please call our office to schedule the follow-up appointment.  Your physician recommends that you continue on your current medications as directed. Please refer to the Current Medication list given to you today.   Your physician has requested that you have a lexiscan myoview. For further information please visit [email or web address]. Please follow instruction sheet, as given.  Your physician recommends that you continue on your current medications as directed. Please refer to the Current Medication list given to you today.  Thank you for choosing [facility]!

## 2014-05-17 ENCOUNTER — Encounter (HOSPITAL_COMMUNITY)
Admission: RE | Admit: 2014-05-17 | Discharge: 2014-05-17 | Disposition: A | Discharge status: Home / Self Care | Payer: Medicare HMO | Source: Ambulatory Visit | Attending: Cardiology | Admitting: Cardiology

## 2014-05-17 ENCOUNTER — Encounter (HOSPITAL_COMMUNITY): Payer: Self-pay

## 2014-05-17 ENCOUNTER — Ambulatory Visit (HOSPITAL_COMMUNITY)
Admission: RE | Admit: 2014-05-17 | Discharge: 2014-05-17 | Disposition: A | Discharge status: Home / Self Care | Payer: Medicare HMO | Source: Ambulatory Visit | Attending: Cardiology | Admitting: Cardiology

## 2014-05-17 DIAGNOSIS — I209 Angina pectoris, unspecified: Secondary | ICD-10-CM | POA: Diagnosis not present

## 2014-05-17 DIAGNOSIS — I25810 Atherosclerosis of coronary artery bypass graft(s) without angina pectoris: Secondary | ICD-10-CM

## 2014-05-17 DIAGNOSIS — I2510 Atherosclerotic heart disease of native coronary artery without angina pectoris: Secondary | ICD-10-CM | POA: Diagnosis not present

## 2014-05-17 MED ORDER — TECHNETIUM TC 99M SESTAMIBI GENERIC - CARDIOLITE
10.0000 | Freq: Once | INTRAVENOUS | Status: AC | PRN
Start: 2014-05-17 — End: 2014-05-17
  Administered 2014-05-17: 10 via INTRAVENOUS

## 2014-05-17 MED ORDER — TECHNETIUM TC 99M SESTAMIBI - CARDIOLITE
30.0000 | Freq: Once | INTRAVENOUS | Status: AC | PRN
  Administered 2014-05-17: 30 via INTRAVENOUS

## 2014-05-17 MED ORDER — SODIUM CHLORIDE 0.9 % IJ SOLN
INTRAMUSCULAR | Status: AC
  Filled 2014-05-17: qty 36

## 2014-05-17 MED ORDER — REGADENOSON 0.4 MG/5ML IV SOLN
0.4000 mg | Freq: Once | INTRAVENOUS | Status: AC | PRN
  Administered 2014-05-17: 0.4 mg via INTRAVENOUS

## 2014-05-17 MED ORDER — SODIUM CHLORIDE 0.9 % IJ SOLN
10.0000 mL | INTRAMUSCULAR | Status: DC | PRN
  Administered 2014-05-17: 10 mL via INTRAVENOUS
  Filled 2014-05-17: qty 10

## 2014-05-17 MED ORDER — SODIUM CHLORIDE 0.9 % IJ SOLN
INTRAMUSCULAR | Status: AC
  Administered 2014-05-17: 10 mL via INTRAVENOUS
  Filled 2014-05-17: qty 3

## 2014-05-17 MED ORDER — REGADENOSON 0.4 MG/5ML IV SOLN
INTRAVENOUS | Status: AC
  Administered 2014-05-17: 0.4 mg via INTRAVENOUS
  Filled 2014-05-17: qty 5

## 2014-05-17 NOTE — Progress Notes (Signed)
Stress Lab Nurses Notes - [facility]  CAMARON CAMMACK 05/17/2014 Reason for doing test: CAD and angina Type of test: Wille Glaser Nurse performing test: Gerrit Halls, RN Nuclear Medicine Tech: Melburn Hake Echo Tech: Not Applicable MD performing test: S. McDowell/K.Purcell Nails NP Family MD: Laurance Flatten Test explained and consent signed: Yes.   IV started: Saline lock flushed, No redness or edema and Saline lock started in radiology Symptoms: SOB Treatment/Intervention: None Reason test stopped: protocol completed After recovery IV was: Discontinued via X-ray tech and No redness or edema Patient to return to Nuc. Med at : 11:30 Patient discharged: Home Patient's Condition upon discharge was: stable Comments: During test BP 175/80 & HR 83.  Recovery BP 140/84 & HR 68.  Symptoms resolved in recovery. Geanie Cooley T

## 2014-05-29 ENCOUNTER — Other Ambulatory Visit: Payer: Self-pay | Admitting: Cardiology

## 2014-07-22 ENCOUNTER — Telehealth: Payer: Self-pay | Admitting: Family Medicine

## 2014-07-22 NOTE — Telephone Encounter (Signed)
Patient has appointment set up for tomorrow at 2:15.

## 2014-07-23 ENCOUNTER — Ambulatory Visit (INDEPENDENT_AMBULATORY_CARE_PROVIDER_SITE_OTHER): Payer: Medicare HMO

## 2014-07-23 ENCOUNTER — Ambulatory Visit (INDEPENDENT_AMBULATORY_CARE_PROVIDER_SITE_OTHER): Payer: Medicare HMO | Admitting: Orthopedic Surgery

## 2014-07-23 ENCOUNTER — Encounter: Payer: Self-pay | Admitting: Orthopedic Surgery

## 2014-07-23 VITALS — BP 152/82 | Ht 70.0 in | Wt 155.0 lb

## 2014-07-23 DIAGNOSIS — M533 Sacrococcygeal disorders, not elsewhere classified: Secondary | ICD-10-CM | POA: Diagnosis not present

## 2014-07-23 DIAGNOSIS — M5441 Lumbago with sciatica, right side: Secondary | ICD-10-CM | POA: Diagnosis not present

## 2014-07-23 MED ORDER — HYDROCODONE-ACETAMINOPHEN 5-325 MG PO TABS: 1.0000 | ORAL_TABLET | Freq: Four times a day (QID) | ORAL | Status: DC | PRN

## 2014-07-23 NOTE — Progress Notes (Signed)
Patient ID: [name] Graham, male   DOB: 05-17-1935, 79 y.o.   MRN: [id number]  Chief Complaint  Patient presents with  . Hip Pain    Right hip pain, no injury.     [name] Graham is a 79 y.o. male.   HPI [name] has complaints of pain in his right lower back radiating down his right leg to his knee for 6 weeks. He had a similar thing several years ago was treated with Norco after a fall and did well.  Now he is complaining of sharp throbbing aching pain in the right leg and hip especially after activity Review of Systems [name] Graham, [name] Graham, shortness of breath, breathing issues, difficulty urinating and excessive nighttime urinating. Related symptoms weakness and tingling in the leg. History of back and joint pain.  Past Medical History  Diagnosis Date  . Coronary atherosclerosis of native coronary artery     BMS circ and PTCA PDA 2007, DES RCA 10/09, DES LAD 2/12  . COPD (chronic obstructive pulmonary disease)   . GERD (gastroesophageal reflux disease)   . Hyperlipidemia   . Essential hypertension, benign   . NSTEMI (non-ST elevated myocardial infarction)     2007    No past surgical history on file.  Family History  Graham Relation Age of Onset  . Heart attack [name] Graham   . Cancer [name] Graham     Social History History  Substance Use Topics  . Smoking status: Current Every Day Smoker -- 1.00 packs/day    Types: Cigarettes    Start date: 06/15/1953  . Smokeless tobacco: Never Used     Comment: 1 pack a day  . Alcohol Use: No    No Known Allergies  Current Outpatient Prescriptions  Medication Sig Dispense Refill  . aspirin 325 MG tablet Take 325 mg by mouth 2 (two) times daily.     . clopidogrel (PLAVIX) 75 MG tablet TAKE 1 TABLET BY MOUTH EVERY DAY 30 tablet 11  . lisinopril (PRINIVIL,ZESTRIL) 40 MG tablet Take 1 tablet (40 mg total) by mouth every morning. 30 tablet 6  . metoprolol tartrate (LOPRESSOR) 25 MG tablet TAKE 1 TABLET EVERY DAY 30 tablet  6  . NITROSTAT 0.4 MG SL tablet PLACE 1 TABLET (0.4 MG TOTAL) UNDER THE TONGUE EVERY 5 (FIVE) MINUTES AS NEEDED. 25 tablet 2  . simvastatin (ZOCOR) 80 MG tablet TAKE 1 TABLET BY MOUTH AT BEDTIME 30 tablet 6  . albuterol (PROVENTIL HFA;VENTOLIN HFA) 108 (90 BASE) MCG/ACT inhaler Inhale 1-2 puffs into the lungs every 6 (six) hours as needed for wheezing. 1 Inhaler 1  . HYDROcodone-acetaminophen (NORCO/VICODIN) 5-325 MG per tablet Take 1 tablet by mouth every 6 (six) hours as needed for moderate pain. 60 tablet 0   No current facility-administered medications for this visit.       Physical Exam Blood pressure 152/82, height 5\' 10"  (1.778 m), weight 155 lb (70.308 kg). Physical Exam The patient is well developed well nourished and well groomed. Orientation to person place and time is normal  Mood is pleasant. Ambulatory status he can walk fine. He has some tenderness over his right SI joint straight leg raises are negative Lasegue's sign negative cross leg test negative hip range of motion normal strength in his right leg normal knee strength and stability normal skin normal pulses right and left leg normal reflexes 2+ equal     Data Reviewed X-ray of his back shows degenerative disc disease with spondylosis Graham of lumbar lordosis  Graham of coronal plane alignment spondylosis is mild  Assessment Encounter Diagnoses  Name Primary?  . Right-sided low back pain with right-sided sciatica Yes  . Sacroiliac joint pain     Plan Inject SI joint I gave her some Norco to take for 10 days if he is not better he's to call us back or if right SI joint injection  Timeout was taken to confirm the site and the patient's identity. He agreed to the procedure. We injected 40 mg of Depo-Medrol the point of maximal tenderness over the right SI joint this was accompanied by 1 mL of lidocaine 1% tolerated well without consultation.

## 2014-07-31 ENCOUNTER — Telehealth: Payer: Self-pay | Admitting: Orthopedic Surgery

## 2014-07-31 NOTE — Telephone Encounter (Signed)
The  left hip and leg is not getting any better at all, he states he thinks its getting worse, Please advise?

## 2014-08-01 NOTE — Telephone Encounter (Signed)
Give him an appt. Per last office note

## 2014-08-01 NOTE — Telephone Encounter (Signed)
PT scheduled 08/05/14

## 2014-08-05 ENCOUNTER — Encounter: Payer: Self-pay | Admitting: Orthopedic Surgery

## 2014-08-05 ENCOUNTER — Ambulatory Visit (INDEPENDENT_AMBULATORY_CARE_PROVIDER_SITE_OTHER): Payer: Medicare HMO | Admitting: Orthopedic Surgery

## 2014-08-05 VITALS — BP 116/65 | Ht 70.0 in | Wt 155.0 lb

## 2014-08-05 DIAGNOSIS — M79604 Pain in right leg: Secondary | ICD-10-CM

## 2014-08-05 DIAGNOSIS — M545 Low back pain: Secondary | ICD-10-CM

## 2014-08-05 MED ORDER — HYDROCODONE-ACETAMINOPHEN 5-325 MG PO TABS: 2.0000 | ORAL_TABLET | Freq: Two times a day (BID) | ORAL | Status: DC

## 2014-08-05 NOTE — Progress Notes (Signed)
Progress note  This is a recheck on a previously treated SI joint pain with radicular symptoms right leg. Patient's pain is relieved by 2 hydrocodone 5 mg tablets twice a day  Patient complains of pain in his lower back and feels like it runs down his right leg, he denies any trauma, the pain is present when he is sitting and driving which radiates down his right leg is worse with sitting is also present at certain times during the day. He has not lost any bowel or bladder dysfunction at this time but his pain has not been relieved after 8 weeks  Reexamination   Vital signs are stable as reviewed and recorded. He is awake alert and oriented 3 mood and affect are normal he is ambulatory without assistive device  he has tenderness in the right buttock area and I straight leg raise which is positive at 45 of flexion this is positive also with the Lasegue's maneuver opposite leg crosslegged straight leg raise is negative. He has reflex 2+ at the knee bilaterally which is equal pulses are good perfusion is good bilaterally and symmetric  There is no muscle weakness. His back flexion tends to exacerbate the pain in his relief with extension  Impression probable herniated disc low back pain with radicular symptoms down right leg failed conservative treatment  Recommend MRI to image the spine to determine if the patient would benefit from epidural steroid injection versus neurosurgical intervention to decompress the nerve.  Meds ordered this encounter  Medications  . HYDROcodone-acetaminophen (NORCO/VICODIN) 5-325 MG per tablet    Sig: Take 2 tablets by mouth 2 (two) times daily.    Dispense:  60 tablet    Refill:  0

## 2014-08-05 NOTE — Patient Instructions (Signed)
We will schedule MRI for you and call you with results

## 2014-08-07 ENCOUNTER — Telehealth: Payer: Self-pay | Admitting: Orthopedic Surgery

## 2014-08-07 NOTE — Telephone Encounter (Signed)
Call (voice message) received today, 08/07/14, regarding this patient's MRI, from [facility], indicating that the MRI is pending denial, and that there is the option to call to discuss with medical director, with REF# [id number], to phone# [phone], Option 4, then Option 2.

## 2014-08-09 NOTE — Telephone Encounter (Signed)
(  08/09/14) Patient aware, MRI pending denial; he has contacted insurer [employer], clinical (histocompatibility and immunogenetics)) directly, spoke with Jenny Reichmann, states clinicals needed to be re-sent; re-faxed again 08/09/14, REF [id number].

## 2014-08-10 NOTE — Telephone Encounter (Signed)
ANY MESSAGES LIKE THIS GO TO [facility]

## 2014-08-14 ENCOUNTER — Telehealth: Payer: Self-pay | Admitting: Orthopedic Surgery

## 2014-08-14 NOTE — Telephone Encounter (Signed)
Please call patient regarding MRI, he has not heard anything regarding date or time/he is in a lot of pain please advise?

## 2014-08-21 ENCOUNTER — Ambulatory Visit (HOSPITAL_COMMUNITY)
Admission: RE | Admit: 2014-08-21 | Discharge: 2014-08-21 | Disposition: A | Discharge status: Home / Self Care | Payer: Medicare HMO | Source: Ambulatory Visit | Attending: Orthopedic Surgery | Admitting: Orthopedic Surgery

## 2014-08-21 DIAGNOSIS — M5416 Radiculopathy, lumbar region: Secondary | ICD-10-CM | POA: Diagnosis not present

## 2014-08-21 DIAGNOSIS — M545 Low back pain, unspecified: Secondary | ICD-10-CM

## 2014-08-21 DIAGNOSIS — M79604 Pain in right leg: Secondary | ICD-10-CM

## 2014-08-22 ENCOUNTER — Telehealth: Payer: Self-pay | Admitting: Orthopedic Surgery

## 2014-08-22 NOTE — Telephone Encounter (Signed)
Patient called to request: (1) MRI results (from 08/21/14) and (2) Refill of medication:  HYDROcodone-acetaminophen (NORCO/VICODIN) 5-325 MG per tablet [[id number]]   - ph# [phone]

## 2014-08-23 ENCOUNTER — Other Ambulatory Visit: Payer: Self-pay | Admitting: *Deleted

## 2014-08-23 MED ORDER — HYDROCODONE-ACETAMINOPHEN 5-325 MG PO TABS: 2.0000 | ORAL_TABLET | Freq: Two times a day (BID) | ORAL | Status: DC

## 2014-08-26 NOTE — Telephone Encounter (Signed)
Prescription available, patient aware  

## 2014-08-28 ENCOUNTER — Telehealth: Payer: Self-pay | Admitting: *Deleted

## 2014-08-28 ENCOUNTER — Other Ambulatory Visit: Payer: Self-pay | Admitting: *Deleted

## 2014-08-28 DIAGNOSIS — M48061 Spinal stenosis, lumbar region without neurogenic claudication: Secondary | ICD-10-CM

## 2014-08-28 NOTE — Telephone Encounter (Signed)
REFERRAL FAXED TO [facility]

## 2014-09-05 ENCOUNTER — Other Ambulatory Visit: Payer: Self-pay

## 2014-09-05 MED ORDER — METOPROLOL TARTRATE 25 MG PO TABS: ORAL_TABLET | ORAL | Status: DC

## 2014-09-05 MED ORDER — LISINOPRIL 40 MG PO TABS: 40.0000 mg | ORAL_TABLET | Freq: Every morning | ORAL | Status: DC

## 2014-09-05 NOTE — Telephone Encounter (Signed)
Refill metoprolol, lisinopril as requested

## 2014-09-09 ENCOUNTER — Other Ambulatory Visit: Payer: Self-pay | Admitting: *Deleted

## 2014-09-09 ENCOUNTER — Telehealth: Payer: Self-pay | Admitting: Orthopedic Surgery

## 2014-09-09 MED ORDER — HYDROCODONE-ACETAMINOPHEN 5-325 MG PO TABS: 2.0000 | ORAL_TABLET | Freq: Two times a day (BID) | ORAL | Status: DC

## 2014-09-09 NOTE — Telephone Encounter (Signed)
Prescription available, patient aware  

## 2014-09-09 NOTE — Telephone Encounter (Signed)
Patient is (1) requesting a refill of medication:  HYDROcodone-acetaminophen (NORCO/VICODIN) 5-325 MG per tablet [[id number]]  (2) reports that he is scheduled for injections with Dr Francesco Runner in [place] at end of this month -- Patient ph# [phone]

## 2014-09-10 NOTE — Telephone Encounter (Signed)
Patient picked up Rx

## 2014-09-25 ENCOUNTER — Other Ambulatory Visit: Payer: Self-pay | Admitting: *Deleted

## 2014-09-25 ENCOUNTER — Telehealth: Payer: Self-pay | Admitting: Orthopedic Surgery

## 2014-09-25 MED ORDER — HYDROCODONE-ACETAMINOPHEN 5-325 MG PO TABS: 2.0000 | ORAL_TABLET | Freq: Two times a day (BID) | ORAL | Status: DC

## 2014-09-25 NOTE — Telephone Encounter (Signed)
Patient is calling requesting a refill on pain medication HYDROcodone-acetaminophen (NORCO/VICODIN) 5-325 MG per tablet please advise?

## 2014-09-25 NOTE — Telephone Encounter (Signed)
APPT 09/07/14 @ 8:30

## 2014-09-26 NOTE — Telephone Encounter (Signed)
Prescription available, patient aware  

## 2014-09-26 NOTE — Telephone Encounter (Signed)
Patient picked up Rx

## 2014-10-06 ENCOUNTER — Other Ambulatory Visit: Payer: Self-pay | Admitting: Cardiology

## 2014-10-09 ENCOUNTER — Other Ambulatory Visit: Payer: Self-pay | Admitting: *Deleted

## 2014-10-09 ENCOUNTER — Telehealth: Payer: Self-pay | Admitting: Orthopedic Surgery

## 2014-10-09 MED ORDER — HYDROCODONE-ACETAMINOPHEN 5-325 MG PO TABS: 2.0000 | ORAL_TABLET | Freq: Two times a day (BID) | ORAL | Status: DC

## 2014-10-09 NOTE — Telephone Encounter (Addendum)
Mr. Seeling was seen by Dr. Dorothey Baseman and he (the patient) states that he (Dr. Dorothey Baseman)  did not prescribe any medications and he (Dr. Dorothey Baseman)  is ordering another MRI, Mr Blanford is  asking for a Rx refill on HYDROcodone-acetaminophen (NORCO/VICODIN) 5-325 MG per tablet from Dr. Aline Brochure  Please advise?

## 2014-10-09 NOTE — Telephone Encounter (Signed)
Prescription available, patient aware  

## 2014-10-14 NOTE — Telephone Encounter (Signed)
Patient picked up Rx

## 2014-10-15 ENCOUNTER — Telehealth: Payer: Self-pay | Admitting: Cardiology

## 2014-10-15 NOTE — Telephone Encounter (Signed)
Pt is going to be having surgery and is wanting to know if he needs to stop his medications

## 2014-10-15 NOTE — Telephone Encounter (Signed)
Spoke to pt, he is having a surgical excision and epidural this month. Both patient surgeons questioned if he can come off Plavix?? And for how long ??

## 2014-10-15 NOTE — Telephone Encounter (Signed)
He can stop Plavix (and aspirin if needed), prior to the procedure. Would hold for 7 days prior to the procedure.

## 2014-10-15 NOTE — Telephone Encounter (Signed)
[id number]

## 2014-10-16 ENCOUNTER — Telehealth: Payer: Self-pay | Admitting: Cardiology

## 2014-10-16 NOTE — Telephone Encounter (Signed)
Spoke to pt and ADVISED him to hold Plavix and aspirin 7 days prior to the injection procedure scheduled for June 24th. Not sure of the dates of the other procedure just yet, but told pt if he has any further questions to give [place] a call.

## 2014-10-16 NOTE — Telephone Encounter (Signed)
Patient states that he is unsure that  once he stops his Plavix and ASA if the provider will still do the procedure. Patient was asked to call provider and ask about procedure and date and time and if it will be .

## 2014-10-16 NOTE — Telephone Encounter (Signed)
Pt is confused on when to stop taking his Plavix for the procedure that he's having done

## 2014-11-20 ENCOUNTER — Encounter: Payer: Self-pay | Admitting: Cardiology

## 2014-11-20 ENCOUNTER — Ambulatory Visit (INDEPENDENT_AMBULATORY_CARE_PROVIDER_SITE_OTHER): Payer: Medicare HMO | Admitting: Cardiology

## 2014-11-20 VITALS — BP 106/64 | HR 63 | Ht 70.0 in | Wt 142.8 lb

## 2014-11-20 DIAGNOSIS — Z136 Encounter for screening for cardiovascular disorders: Secondary | ICD-10-CM

## 2014-11-20 DIAGNOSIS — I10 Essential (primary) hypertension: Secondary | ICD-10-CM | POA: Diagnosis not present

## 2014-11-20 DIAGNOSIS — E782 Mixed hyperlipidemia: Secondary | ICD-10-CM | POA: Diagnosis not present

## 2014-11-20 DIAGNOSIS — I25810 Atherosclerosis of coronary artery bypass graft(s) without angina pectoris: Secondary | ICD-10-CM

## 2014-11-20 NOTE — Progress Notes (Signed)
Cardiology Office Note  Date: 11/20/2014   ID: [name], DOB 1936-01-19, MRN [id number]  PCP: Redge Gainer, MD  Primary Cardiologist: Rozann Lesches, MD   Chief Complaint  Patient presents with  . Coronary Artery Disease  . Hyperlipidemia    History of Present Illness: [name] Graham is a 79 y.o. male last seen in January. He presents for a routine follow-up visit. From a cardiac perspective, he has been stable without significant angina symptoms on medical therapy. Ischemic testing from earlier this year is outlined below, overall low risk.  He has been having evaluation for lumbar spine pain, already had one epidural injection, and is to undergo a second injection with Dr. Francesco Runner in August. He also tells me that he has some type of soft tissue mass in his right gluteal area, had an MRI done at [facility]. There is some discussion about whether this will be surgically resected. He did see Dr. Anthony Sar, but asked to have a second opinion.  Follow-up ECG is reviewed below.   Past Medical History  Diagnosis Date  . Coronary atherosclerosis of native coronary artery     BMS circ and PTCA PDA 2007, DES RCA 10/09, DES LAD 2/12  . COPD (chronic obstructive pulmonary disease)   . GERD (gastroesophageal reflux disease)   . Hyperlipidemia   . Essential hypertension, benign   . NSTEMI (non-ST elevated myocardial infarction)     2007    History reviewed. No pertinent past surgical history.  Current Outpatient Prescriptions  Medication Sig Dispense Refill  . aspirin 325 MG tablet Take 325 mg by mouth 2 (two) times daily.     . clopidogrel (PLAVIX) 75 MG tablet TAKE 1 TABLET BY MOUTH EVERY DAY 30 tablet 11  . HYDROcodone-acetaminophen (NORCO/VICODIN) 5-325 MG per tablet Take 2 tablets by mouth 2 (two) times daily. 60 tablet 0  . lisinopril (PRINIVIL,ZESTRIL) 40 MG tablet Take 1 tablet (40 mg total) by mouth every morning. 30 tablet 6  . metoprolol tartrate (LOPRESSOR) 25  MG tablet TAKE 1 TABLET EVERY DAY 30 tablet 6  . NITROSTAT 0.4 MG SL tablet PLACE 1 TABLET (0.4 MG TOTAL) UNDER THE TONGUE EVERY 5 (FIVE) MINUTES AS NEEDED. 25 tablet 3  . simvastatin (ZOCOR) 80 MG tablet TAKE 1 TABLET BY MOUTH AT BEDTIME 30 tablet 6  . albuterol (PROVENTIL HFA;VENTOLIN HFA) 108 (90 BASE) MCG/ACT inhaler Inhale 1-2 puffs into the lungs every 6 (six) hours as needed for wheezing. 1 Inhaler 1   No current facility-administered medications for this visit.    Allergies:  Review of patient's allergies indicates no known allergies.   Social History: The patient  reports that he has been smoking Cigarettes.  He started smoking about 61 years ago. He has been smoking about 1.00 pack per day. He has never used smokeless tobacco. He reports that he does not drink alcohol or use illicit drugs.   ROS:  Please see the history of present illness. Otherwise, complete review of systems is positive for lower back and leg pain, improved after his first epidural injection.  All other systems are reviewed and negative.   Physical Exam: VS:  BP 106/64 mmHg  Pulse 63  Ht '5\' 10"'$  (1.778 m)  Wt 142 lb 12.8 oz (64.774 kg)  BMI 20.49 kg/m2  SpO2 94%, BMI Body mass index is 20.49 kg/(m^2).  Wt Readings from Last 3 Encounters:  11/20/14 142 lb 12.8 oz (64.774 kg)  08/21/14 150 lb (68.04 kg)  08/05/14  155 lb (70.308 kg)     Normally nourished appearing male in no acute distress.  HEENT: Conjunctiva and lids normal, oropharynx with moist mucosa, poor dentition.  Neck: Supple, no carotid bruits or elevated JVP.  Lungs: Clear with diminished breath sounds, nonlabored.  Cardiac: Regular rate and rhythm, no significant systolic murmur or S3.  Abdomen: Soft, nontender, bowel sounds present.  Extremities: No pitting edema legs.  Skin: Warm and dry.  Musculoskeletal: No kyphosis.  Neuropsychiatric: Alert and oriented x3, affect appropriate.   ECG: ECG is ordered today and shows normal  sinus rhythm with left anterior fascicular block.  Recent Labwork: 05/09/2014: ALT 13; AST 16     Component Value Date/Time   CHOL 138 05/09/2014 0734   CHOL 144 06/15/2013 0931   TRIG 92 05/09/2014 0734   HDL 55 05/09/2014 0734   HDL 61 06/15/2013 0931   CHOLHDL 2.5 05/09/2014 0734   CHOLHDL 2.4 06/15/2013 0931   VLDL 18 05/09/2014 0734   LDLCALC 65 05/09/2014 0734   LDLCALC 69 06/15/2013 0931    Other Studies Reviewed Today:  Lexiscan Cardiolite 05/17/2014: FINDINGS: Baseline tracing shows normal sinus rhythm. Lexiscan bolus was given in standard fashion. Heart rate increased from 52 beats per min up to 83 beats per min, and blood pressure increased from 133/78 up to 175/80. No chest pain was reported. There were no diagnostic ST segment changes, occasional PACs were noted.  Analysis of the raw perfusion data finds adequate myocardial radiotracer uptake with diaphragmatic attenuation.  Perfusion: There is a moderate-sized, moderate intensity, fixed inferior defect from apex to base. There is also a very small, mild intensity, apical anterior defect that is partially reversible.  Wall Motion: Normal left ventricular wall motion. No left ventricular dilation.  Left Ventricular Ejection Fraction: 52 %  End diastolic volume 264 ml  End systolic volume 58 ml  IMPRESSION: 1. Evidence of diaphragmatic attenuation with fixed inferior defect as outlined, also a small region of apical ischemia noted. There are no large ischemic territories.  2. Normal left ventricular wall motion.  3. Left ventricular ejection fraction 52%  4. Low-risk stress test findings*.   ASSESSMENT AND PLAN:  1. Symptomatically stable CAD status post percutaneous coronary interventions as detailed above. Recent ischemic testing was low risk. Plan is to continue medical therapy and observation.  2. Pending lumbar epidural injection per Dr. Francesco Runner. Recommended holding aspirin and Plavix  for 7 days prior.  3. Reported right gluteal subcutaneous mass, details not yet available. Requesting MRI results from [facility]. Making a referral to Dr. Arnoldo Morale for a second opinion at patient request.  4. Hyperlipidemia, has been well controlled on Zocor.  5. Essential hypertension, blood pressure is normal today.  Current medicines were reviewed at length with the patient today.   Orders Placed This Encounter  Procedures  . EKG 12-Lead    Disposition: FU with me in 6 months.   Signed, Satira Sark, MD, [facility] 11/20/2014 2:27 PM    [place] at [facility] [address], [place], [place] [address] Phone: [phone]; Fax: [phone]

## 2014-11-20 NOTE — Patient Instructions (Signed)
Your physician wants you to follow-up in: [place]. You will receive a reminder letter in the mail two months in advance. If you don't receive a letter, please call our office to schedule the follow-up appointment.  Your physician recommends that you continue on your current medications as directed. Please refer to the Current Medication list given to you today.  WE WILL REQUEST A CORP OF YOUR MRI FROM [facility]  Thanks for choosing [facility]!!!

## 2015-01-07 ENCOUNTER — Other Ambulatory Visit: Payer: Self-pay | Admitting: Cardiology

## 2015-01-10 ENCOUNTER — Other Ambulatory Visit: Payer: Self-pay | Admitting: *Deleted

## 2015-01-10 MED ORDER — SIMVASTATIN 80 MG PO TABS: 80.0000 mg | ORAL_TABLET | Freq: Every day | ORAL | Status: DC

## 2015-01-10 MED ORDER — CLOPIDOGREL BISULFATE 75 MG PO TABS: 75.0000 mg | ORAL_TABLET | Freq: Every day | ORAL | Status: DC

## 2015-01-10 MED ORDER — LISINOPRIL 40 MG PO TABS: 40.0000 mg | ORAL_TABLET | Freq: Every morning | ORAL | Status: DC

## 2015-01-10 MED ORDER — METOPROLOL TARTRATE 25 MG PO TABS: ORAL_TABLET | ORAL | Status: DC

## 2015-01-10 NOTE — Telephone Encounter (Signed)
90 day supply of lisinopril requested [facility]. Medication sent to pharmacy.

## 2015-03-17 ENCOUNTER — Telehealth: Payer: Self-pay | Admitting: Cardiology

## 2015-03-17 NOTE — Telephone Encounter (Signed)
Pt given message from Dr Caryl Ada has our fax number in case surgeon requests clearance note

## 2015-03-17 NOTE — Telephone Encounter (Signed)
Pt is having surgery on a tumor on Monday 03/24/15 at [facility] and would like Dr. Domenic Polite to know. The pt would also like to know when he needs to stop taking his blood thinner & aspirin

## 2015-03-17 NOTE — Telephone Encounter (Signed)
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

## 2015-03-17 NOTE — Telephone Encounter (Addendum)
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 I presume that this is being resected. Generally the surgeon would request for him to be off aspirin and Plavix for 7 days prior to surgery.

## 2015-05-21 ENCOUNTER — Ambulatory Visit: Payer: Medicare HMO | Admitting: Cardiology

## 2015-06-17 ENCOUNTER — Encounter: Payer: Self-pay | Admitting: Cardiology

## 2015-06-17 ENCOUNTER — Encounter: Payer: Medicare HMO | Admitting: Cardiology

## 2015-06-17 NOTE — Progress Notes (Signed)
No show  This encounter was created in error - please disregard.

## 2015-07-17 ENCOUNTER — Ambulatory Visit (INDEPENDENT_AMBULATORY_CARE_PROVIDER_SITE_OTHER): Payer: Medicare HMO | Admitting: Cardiology

## 2015-07-17 ENCOUNTER — Encounter: Payer: Self-pay | Admitting: Cardiology

## 2015-07-17 VITALS — BP 120/70 | HR 74 | Ht 70.0 in | Wt 156.0 lb

## 2015-07-17 DIAGNOSIS — E782 Mixed hyperlipidemia: Secondary | ICD-10-CM | POA: Diagnosis not present

## 2015-07-17 DIAGNOSIS — I25810 Atherosclerosis of coronary artery bypass graft(s) without angina pectoris: Secondary | ICD-10-CM | POA: Diagnosis not present

## 2015-07-17 DIAGNOSIS — F17200 Nicotine dependence, unspecified, uncomplicated: Secondary | ICD-10-CM | POA: Diagnosis not present

## 2015-07-17 NOTE — Progress Notes (Signed)
Cardiology Office Note  Date: 07/17/2015   ID: [name] Graham, DOB 1935/06/17, MRN [id number]  PCP: Redge Gainer, MD  Primary Cardiologist: Rozann Lesches, MD   Chief Complaint  Patient presents with  . Coronary Artery Disease    History of Present Illness: [name] Graham is a 80 y.o. male last seen in July 2016. He presents for a routine follow-up visit. He does not report any progressing angina symptoms. He has had trouble with chest congestion and cough over the winter months. States that he is thinking more and more about trying to make another attempt at quitting smoking. He also let me know that his ex-wife is undergoing bypass surgery at [facility] later today, he is on his way to the hospital after this visit.  We went over his medications. He is on aspirin, Plavix, lisinopril, Lopressor, Zocor, and has as needed nitroglycerin. Lipids have been well-controlled, last LDL was 65.  Past Medical History  Diagnosis Date  . Coronary atherosclerosis of native coronary artery     BMS circ and PTCA PDA 2007, DES RCA 10/09, DES LAD 2/12  . COPD (chronic obstructive pulmonary disease) ([place])   . GERD (gastroesophageal reflux disease)   . Hyperlipidemia   . Essential hypertension, benign   . NSTEMI (non-ST elevated myocardial infarction) ([place])     2007    Current Outpatient Prescriptions  Medication Sig Dispense Refill  . aspirin 325 MG tablet Take 325 mg by mouth 2 (two) times daily.     . clopidogrel (PLAVIX) 75 MG tablet Take 1 tablet (75 mg total) by mouth daily. 90 tablet 3  . HYDROcodone-acetaminophen (NORCO/VICODIN) 5-325 MG per tablet Take 2 tablets by mouth 2 (two) times daily. 60 tablet 0  . lisinopril (PRINIVIL,ZESTRIL) 40 MG tablet Take 1 tablet (40 mg total) by mouth every morning. 90 tablet 3  . metoprolol tartrate (LOPRESSOR) 25 MG tablet TAKE 1 TABLET EVERY DAY 90 tablet 3  . NITROSTAT 0.4 MG SL tablet PLACE 1 TABLET (0.4 MG TOTAL) UNDER THE TONGUE EVERY 5  (FIVE) MINUTES AS NEEDED. 25 tablet 3  . simvastatin (ZOCOR) 80 MG tablet Take 1 tablet (80 mg total) by mouth at bedtime. 90 tablet 3  . albuterol (PROVENTIL HFA;VENTOLIN HFA) 108 (90 BASE) MCG/ACT inhaler Inhale 1-2 puffs into the lungs every 6 (six) hours as needed for wheezing. 1 Inhaler 1   No current facility-administered medications for this visit.   Allergies:  Review of patient's allergies indicates no known allergies.   Social History: The patient  reports that he has been smoking Cigarettes.  He started smoking about 62 years ago. He has been smoking about 1.00 pack per day. He has never used smokeless tobacco. He reports that he does not drink alcohol or use illicit drugs.   ROS:  Please see the history of present illness. Otherwise, complete review of systems is positive for chest congestion, no fevers or chills.  All other systems are reviewed and negative.   Physical Exam: VS:  BP 120/70 mmHg  Pulse 74  Ht '5\' 10"'$  (1.778 m)  Wt 156 lb (70.761 kg)  BMI 22.38 kg/m2  SpO2 96%, BMI Body mass index is 22.38 kg/(m^2).  Wt Readings from Last 3 Encounters:  07/17/15 156 lb (70.761 kg)  11/20/14 142 lb 12.8 oz (64.774 kg)  08/21/14 150 lb (68.04 kg)    Normally nourished appearing male in no acute distress.  HEENT: Conjunctiva and lids normal, oropharynx with moist mucosa, poor dentition.  Neck: Supple, no carotid bruits or elevated JVP.  Lungs: Clear with diminished breath sounds, nonlabored.  Cardiac: Regular rate and rhythm, no significant systolic murmur or S3.  Abdomen: Soft, nontender, bowel sounds present.  Extremities: No pitting edema legs.   ECG: I personally reviewed the prior tracing from 11/20/2014 which showed sinus bradycardia with left anterior fascicular block.  Recent Labwork:     Component Value Date/Time   CHOL 138 05/09/2014 0734   CHOL 144 06/15/2013 0931   TRIG 92 05/09/2014 0734   HDL 55 05/09/2014 0734   HDL 61 06/15/2013 0931   CHOLHDL  2.5 05/09/2014 0734   CHOLHDL 2.4 06/15/2013 0931   VLDL 18 05/09/2014 0734   LDLCALC 65 05/09/2014 0734   LDLCALC 69 06/15/2013 0931    Other Studies Reviewed Today:  Lexiscan Cardiolite 05/17/2014: FINDINGS: Baseline tracing shows normal sinus rhythm. Lexiscan bolus was given in standard fashion. Heart rate increased from 52 beats per min up to 83 beats per min, and blood pressure increased from 133/78 up to 175/80. No chest pain was reported. There were no diagnostic ST segment changes, occasional PACs were noted.  Analysis of the raw perfusion data finds adequate myocardial radiotracer uptake with diaphragmatic attenuation.  Perfusion: There is a moderate-sized, moderate intensity, fixed inferior defect from apex to base. There is also a very small, mild intensity, apical anterior defect that is partially reversible.  Wall Motion: Normal left ventricular wall motion. No left ventricular dilation.  Left Ventricular Ejection Fraction: 52 %  End diastolic volume 403 ml  End systolic volume 58 ml  IMPRESSION: 1. Evidence of diaphragmatic attenuation with fixed inferior defect as outlined, also a small region of apical ischemia noted. There are no large ischemic territories.  2. Normal left ventricular wall motion.  3. Left ventricular ejection fraction 52%  4. Low-risk stress test findings*.  Assessment and Plan:  1. Symptomatically stable CAD status post percutaneous interventions as outlined above, most recently DES to the LAD in 2012. He has done well on medical therapy, had a low risk Cardiolite study last year. We continue medical therapy and observation.  2. Tobacco abuse. We talked about smoking cessation strategies again today. He was able to quit for 2 years in the past. Seems to be much more focused on trying to quit smoking again. Did not tolerate Chantix.  3. Hyperlipidemia, well controlled statin therapy.  Current medicines were reviewed with  the patient today.  Disposition: FU with me in 6 months.   Signed, Satira Sark, MD, [facility] 07/17/2015 1:37 PM    [facility] at [facility] [address], [place], [place] [address] Phone: [phone]; Fax: [phone]

## 2015-07-17 NOTE — Patient Instructions (Signed)

## 2015-10-23 ENCOUNTER — Other Ambulatory Visit: Payer: Self-pay

## 2015-10-23 MED ORDER — NITROGLYCERIN 0.4 MG SL SUBL: 0.4000 mg | SUBLINGUAL_TABLET | SUBLINGUAL | Status: DC | PRN

## 2015-10-23 NOTE — Telephone Encounter (Signed)
Refill complete 

## 2015-12-16 ENCOUNTER — Encounter: Payer: Self-pay | Admitting: Cardiology

## 2016-01-14 ENCOUNTER — Ambulatory Visit: Payer: Medicare HMO | Admitting: Cardiology

## 2016-01-19 ENCOUNTER — Other Ambulatory Visit: Payer: Self-pay | Admitting: Cardiology

## 2016-01-22 ENCOUNTER — Encounter: Payer: Self-pay | Admitting: *Deleted

## 2016-01-23 ENCOUNTER — Encounter: Payer: Self-pay | Admitting: Cardiology

## 2016-01-23 ENCOUNTER — Ambulatory Visit (INDEPENDENT_AMBULATORY_CARE_PROVIDER_SITE_OTHER): Payer: Medicare HMO | Admitting: Cardiology

## 2016-01-23 VITALS — BP 121/68 | HR 60 | Ht 70.0 in | Wt 145.0 lb

## 2016-01-23 DIAGNOSIS — I10 Essential (primary) hypertension: Secondary | ICD-10-CM | POA: Diagnosis not present

## 2016-01-23 DIAGNOSIS — F17200 Nicotine dependence, unspecified, uncomplicated: Secondary | ICD-10-CM

## 2016-01-23 DIAGNOSIS — I2510 Atherosclerotic heart disease of native coronary artery without angina pectoris: Secondary | ICD-10-CM

## 2016-01-23 NOTE — Progress Notes (Signed)
Cardiology Office Note  Date: 01/23/2016   ID: [name] Graham, DOB [birth year], MRN [id number]  PCP: Kenn File, MD  Primary Cardiologist: Rozann Lesches, MD   Chief Complaint  Patient presents with  . Coronary Artery Disease    History of Present Illness: [name] Graham is a 80 y.o. male last seen in March. He presents for a routine follow-up visit. He reports occasional angina symptoms responsive to a single nitroglycerin. This has not been progressive. I reviewed his ECG which shows sinus bradycardia with left anterior fascicular block. 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  Stress test from last year was low risk as outlined below. He remains on a stable cardiac regimen including aspirin, Plavix, lisinopril, Lopressor, Zocor, and as needed nitroglycerin.  Past Medical History:  Diagnosis Date  . COPD (chronic obstructive pulmonary disease) ([place])   . Coronary atherosclerosis of native coronary artery    BMS circ and PTCA PDA 2007, DES RCA 10/09, DES LAD 2/12  . Essential hypertension, benign   . GERD (gastroesophageal reflux disease)   . Hyperlipidemia   . NSTEMI (non-ST elevated myocardial infarction) ([place])    2007    Current Outpatient Prescriptions  Medication Sig Dispense Refill  . aspirin 325 MG tablet Take 325 mg by mouth 2 (two) times daily.     . clopidogrel (PLAVIX) 75 MG tablet TAKE ONE TABLET BY MOUTH ONCE DAILY 90 tablet 3  . HYDROcodone-acetaminophen (NORCO) 7.5-325 MG tablet Take 1 tablet by mouth every 6 (six) hours as needed.    [place] lisinopril (PRINIVIL,ZESTRIL) 40 MG tablet Take 1 tablet (40 mg total) by mouth every morning. 90 tablet 3  . metoprolol tartrate (LOPRESSOR) 25 MG tablet TAKE 1 TABLET EVERY DAY 90 tablet 3  . nitroGLYCERIN (NITROSTAT) 0.4 MG SL tablet Place 1 tablet (0.4 mg total) under the tongue every 5 (five) minutes as needed for chest pain. 25 tablet 3  . pantoprazole (PROTONIX) 40 MG  tablet Take 40 mg by mouth daily.    . simvastatin (ZOCOR) 40 MG tablet Take 40 mg by mouth daily.     No current facility-administered medications for this visit.    Allergies:  Review of patient's allergies indicates no known allergies.   Social History: The patient  reports that he has been smoking Cigarettes.  He started smoking about 62 years ago. He has been smoking about 1.00 pack per day. He has never used smokeless tobacco. He reports that he does not drink alcohol or use drugs.   ROS:  Please see the history of present illness. Otherwise, complete review of systems is positive for none.  All other systems are reviewed and negative.   Physical Exam: VS:  BP 121/68   Pulse 60   Ht '5\' 10"'$  (1.778 m)   Wt 145 lb (65.8 kg)   BMI 20.81 kg/m , BMI Body mass index is 20.81 kg/m.  Wt Readings from Last 3 Encounters:  01/23/16 145 lb (65.8 kg)  07/17/15 156 lb (70.8 kg)  11/20/14 142 lb 12.8 oz (64.8 kg)    Normally nourished appearing male in no acute distress.  HEENT: Conjunctiva and lids normal, oropharynx with moist mucosa, poor dentition.  Neck: Supple, no carotid bruits or elevated JVP.  Lungs: Clear with diminished breath sounds, nonlabored.  Cardiac: Regular rate and rhythm, no significant systolic murmur or S3.  Abdomen: Soft, nontender, bowel sounds present.  Extremities: No pitting edema legs.  ECG: I personally reviewed the tracing from 10/21/2014 which showed sinus bradycardia with left anterior fascicular block.  Recent Labwork:    Component Value Date/Time   CHOL 138 05/09/2014 0734   CHOL 144 06/15/2013 0931   TRIG 92 05/09/2014 0734   HDL 55 05/09/2014 0734   HDL 61 06/15/2013 0931   CHOLHDL 2.5 05/09/2014 0734   VLDL 18 05/09/2014 0734   LDLCALC 65 05/09/2014 0734   LDLCALC 69 06/15/2013 0931    Other Studies Reviewed Today:  Lexiscan Cardiolite 05/17/2014: FINDINGS: Baseline tracing shows normal sinus rhythm. Lexiscan bolus was given in  standard fashion. Heart rate increased from 52 beats per min up to 83 beats per min, and blood pressure increased from 133/78 up to 175/80. No chest pain was reported. There were no diagnostic ST segment changes, occasional PACs were noted.  Analysis of the raw perfusion data finds adequate myocardial radiotracer uptake with diaphragmatic attenuation.  Perfusion: There is a moderate-sized, moderate intensity, fixed inferior defect from apex to base. There is also a very small, mild intensity, apical anterior defect that is partially reversible.  Wall Motion: Normal left ventricular wall motion. No left ventricular dilation.  Left Ventricular Ejection Fraction: 52 %  End diastolic volume 158 ml  End systolic volume 58 ml  IMPRESSION: 1. Evidence of diaphragmatic attenuation with fixed inferior defect as outlined, also a small region of apical ischemia noted. There are no large ischemic territories.  2. Normal left ventricular wall motion.  3. Left ventricular ejection fraction 52%  4. Low-risk stress test findings*.  Assessment and Plan:  1. Symptomatically stable CAD status post multiple PCI as outlined above. He has stable angina symptoms with low risk Cardiolite study noted above from last year. ECG stable. Continue medical therapy and observation.  2. Long-standing tobacco abuse, has not been motivated to quit.  3. Essential hypertension, blood pressure is well controlled today.  Current medicines were reviewed with the patient today.   Orders Placed This Encounter  Procedures  . EKG 12-Lead    Disposition: Follow-up with me in 6 months.  Signed, Satira Sark, MD, [facility] 01/23/2016 2:54 PM    [facility] at [place], [place], [place] [address] Phone: [phone]; Fax: [phone]

## 2016-01-23 NOTE — Patient Instructions (Signed)

## 2016-03-05 ENCOUNTER — Other Ambulatory Visit: Payer: Self-pay | Admitting: Cardiology

## 2016-03-05 ENCOUNTER — Other Ambulatory Visit: Payer: Self-pay | Admitting: *Deleted

## 2016-03-05 MED ORDER — METOPROLOL TARTRATE 25 MG PO TABS: 25.0000 mg | ORAL_TABLET | Freq: Every day | ORAL | 3 refills | Status: DC

## 2016-04-10 ENCOUNTER — Observation Stay (HOSPITAL_COMMUNITY)
Admission: EM | Admit: 2016-04-10 | Discharge: 2016-04-11 | Disposition: A | Discharge status: Home / Self Care | Payer: Medicare HMO | Attending: Nephrology | Admitting: Nephrology

## 2016-04-10 ENCOUNTER — Emergency Department (HOSPITAL_COMMUNITY): Payer: Medicare HMO

## 2016-04-10 ENCOUNTER — Encounter (HOSPITAL_COMMUNITY): Payer: Self-pay | Admitting: *Deleted

## 2016-04-10 DIAGNOSIS — R55 Syncope and collapse: Secondary | ICD-10-CM | POA: Diagnosis not present

## 2016-04-10 DIAGNOSIS — Y999 Unspecified external cause status: Secondary | ICD-10-CM | POA: Diagnosis not present

## 2016-04-10 DIAGNOSIS — R61 Generalized hyperhidrosis: Secondary | ICD-10-CM | POA: Insufficient documentation

## 2016-04-10 DIAGNOSIS — I10 Essential (primary) hypertension: Secondary | ICD-10-CM | POA: Diagnosis not present

## 2016-04-10 DIAGNOSIS — J449 Chronic obstructive pulmonary disease, unspecified: Secondary | ICD-10-CM | POA: Insufficient documentation

## 2016-04-10 DIAGNOSIS — Z7982 Long term (current) use of aspirin: Secondary | ICD-10-CM | POA: Insufficient documentation

## 2016-04-10 DIAGNOSIS — Z79899 Other long term (current) drug therapy: Secondary | ICD-10-CM | POA: Diagnosis not present

## 2016-04-10 DIAGNOSIS — E782 Mixed hyperlipidemia: Secondary | ICD-10-CM | POA: Diagnosis present

## 2016-04-10 DIAGNOSIS — R5383 Other fatigue: Secondary | ICD-10-CM | POA: Insufficient documentation

## 2016-04-10 DIAGNOSIS — W1830XA Fall on same level, unspecified, initial encounter: Secondary | ICD-10-CM | POA: Diagnosis not present

## 2016-04-10 DIAGNOSIS — R531 Weakness: Secondary | ICD-10-CM | POA: Diagnosis present

## 2016-04-10 DIAGNOSIS — F17210 Nicotine dependence, cigarettes, uncomplicated: Secondary | ICD-10-CM | POA: Diagnosis present

## 2016-04-10 DIAGNOSIS — Y9389 Activity, other specified: Secondary | ICD-10-CM | POA: Diagnosis not present

## 2016-04-10 DIAGNOSIS — R42 Dizziness and giddiness: Secondary | ICD-10-CM | POA: Diagnosis not present

## 2016-04-10 DIAGNOSIS — R509 Fever, unspecified: Secondary | ICD-10-CM | POA: Insufficient documentation

## 2016-04-10 DIAGNOSIS — R110 Nausea: Secondary | ICD-10-CM | POA: Insufficient documentation

## 2016-04-10 DIAGNOSIS — I2510 Atherosclerotic heart disease of native coronary artery without angina pectoris: Secondary | ICD-10-CM | POA: Diagnosis present

## 2016-04-10 DIAGNOSIS — Y9289 Other specified places as the place of occurrence of the external cause: Secondary | ICD-10-CM | POA: Insufficient documentation

## 2016-04-10 DIAGNOSIS — R0789 Other chest pain: Principal | ICD-10-CM | POA: Diagnosis present

## 2016-04-10 DIAGNOSIS — R079 Chest pain, unspecified: Secondary | ICD-10-CM | POA: Diagnosis present

## 2016-04-10 LAB — COMPREHENSIVE METABOLIC PANEL
ALT: 12 U/L — ABNORMAL LOW (ref 17–63)
ANION GAP: 8 (ref 5–15)
AST: 16 U/L (ref 15–41)
Albumin: 4 g/dL (ref 3.5–5.0)
Alkaline Phosphatase: 49 U/L (ref 38–126)
BILIRUBIN TOTAL: 0.6 mg/dL (ref 0.3–1.2)
BUN: 14 mg/dL (ref 6–20)
CHLORIDE: 103 mmol/L (ref 101–111)
CO2: 26 mmol/L (ref 22–32)
Calcium: 9 mg/dL (ref 8.9–10.3)
Creatinine, Ser: 0.98 mg/dL (ref 0.61–1.24)
Glucose, Bld: 146 mg/dL — ABNORMAL HIGH (ref 65–99)
POTASSIUM: 3.7 mmol/L (ref 3.5–5.1)
Sodium: 137 mmol/L (ref 135–145)
TOTAL PROTEIN: 7 g/dL (ref 6.5–8.1)

## 2016-04-10 LAB — CREATININE, SERUM: CREATININE: 0.88 mg/dL (ref 0.61–1.24)

## 2016-04-10 LAB — TROPONIN I

## 2016-04-10 LAB — CBC
HEMATOCRIT: 46.3 % (ref 39.0–52.0)
HEMOGLOBIN: 16 g/dL (ref 13.0–17.0)
MCH: 33.4 pg (ref 26.0–34.0)
MCHC: 34.6 g/dL (ref 30.0–36.0)
MCV: 96.7 fL (ref 78.0–100.0)
Platelets: 181 10*3/uL (ref 150–400)
RBC: 4.79 MIL/uL (ref 4.22–5.81)
RDW: 13 % (ref 11.5–15.5)
WBC: 6.9 10*3/uL (ref 4.0–10.5)

## 2016-04-10 LAB — CBC WITH DIFFERENTIAL/PLATELET
BASOS ABS: 0.1 10*3/uL (ref 0.0–0.1)
Basophils Relative: 1 %
Eosinophils Absolute: 0.1 10*3/uL (ref 0.0–0.7)
Eosinophils Relative: 2 %
HEMATOCRIT: 45.6 % (ref 39.0–52.0)
Hemoglobin: 15.8 g/dL (ref 13.0–17.0)
Lymphocytes Relative: 19 %
Lymphs Abs: 1.7 10*3/uL (ref 0.7–4.0)
MCH: 33.5 pg (ref 26.0–34.0)
MCHC: 34.6 g/dL (ref 30.0–36.0)
MCV: 96.6 fL (ref 78.0–100.0)
MONO ABS: 0.5 10*3/uL (ref 0.1–1.0)
MONOS PCT: 5 %
NEUTROS ABS: 6.5 10*3/uL (ref 1.7–7.7)
Neutrophils Relative %: 73 %
Platelets: 183 10*3/uL (ref 150–400)
RBC: 4.72 MIL/uL (ref 4.22–5.81)
RDW: 12.9 % (ref 11.5–15.5)
WBC: 8.9 10*3/uL (ref 4.0–10.5)

## 2016-04-10 LAB — TSH: TSH: 4.89 u[IU]/mL — ABNORMAL HIGH (ref 0.350–4.500)

## 2016-04-10 MED ORDER — ASPIRIN 325 MG PO TABS
325.0000 mg | ORAL_TABLET | Freq: Two times a day (BID) | ORAL | Status: DC
  Administered 2016-04-10 – 2016-04-11 (×2): 325 mg via ORAL
  Filled 2016-04-10 (×2): qty 1

## 2016-04-10 MED ORDER — ALBUTEROL SULFATE (2.5 MG/3ML) 0.083% IN NEBU: 3.0000 mL | INHALATION_SOLUTION | Freq: Four times a day (QID) | RESPIRATORY_TRACT | Status: DC | PRN

## 2016-04-10 MED ORDER — SODIUM CHLORIDE 0.9% FLUSH
3.0000 mL | Freq: Two times a day (BID) | INTRAVENOUS | Status: DC
  Administered 2016-04-10: 3 mL via INTRAVENOUS

## 2016-04-10 MED ORDER — SIMVASTATIN 20 MG PO TABS
40.0000 mg | ORAL_TABLET | Freq: Every day | ORAL | Status: DC
  Administered 2016-04-10: 40 mg via ORAL
  Filled 2016-04-10: qty 2

## 2016-04-10 MED ORDER — NITROGLYCERIN 0.4 MG SL SUBL: 0.4000 mg | SUBLINGUAL_TABLET | SUBLINGUAL | Status: DC | PRN

## 2016-04-10 MED ORDER — ONDANSETRON HCL 4 MG/2ML IJ SOLN: 4.0000 mg | Freq: Four times a day (QID) | INTRAMUSCULAR | Status: DC | PRN

## 2016-04-10 MED ORDER — CLOPIDOGREL BISULFATE 75 MG PO TABS
75.0000 mg | ORAL_TABLET | Freq: Every day | ORAL | Status: DC
  Administered 2016-04-11: 75 mg via ORAL
  Filled 2016-04-10: qty 1

## 2016-04-10 MED ORDER — LISINOPRIL 10 MG PO TABS
40.0000 mg | ORAL_TABLET | Freq: Every day | ORAL | Status: DC
  Administered 2016-04-11: 40 mg via ORAL
  Filled 2016-04-10: qty 4

## 2016-04-10 MED ORDER — HYDROCODONE-ACETAMINOPHEN 7.5-325 MG PO TABS: 1.0000 | ORAL_TABLET | Freq: Four times a day (QID) | ORAL | Status: DC | PRN

## 2016-04-10 MED ORDER — METOPROLOL TARTRATE 25 MG PO TABS
25.0000 mg | ORAL_TABLET | Freq: Every day | ORAL | Status: DC
  Filled 2016-04-10: qty 1

## 2016-04-10 MED ORDER — HEPARIN SODIUM (PORCINE) 5000 UNIT/ML IJ SOLN
5000.0000 [IU] | Freq: Three times a day (TID) | INTRAMUSCULAR | Status: DC
  Administered 2016-04-10 – 2016-04-11 (×2): 5000 [IU] via SUBCUTANEOUS
  Filled 2016-04-10 (×2): qty 1

## 2016-04-10 MED ORDER — ONDANSETRON HCL 4 MG PO TABS: 4.0000 mg | ORAL_TABLET | Freq: Four times a day (QID) | ORAL | Status: DC | PRN

## 2016-04-10 MED ORDER — PANTOPRAZOLE SODIUM 40 MG PO TBEC
40.0000 mg | DELAYED_RELEASE_TABLET | Freq: Every day | ORAL | Status: DC
  Administered 2016-04-11: 40 mg via ORAL
  Filled 2016-04-10: qty 1

## 2016-04-10 MED ORDER — SODIUM CHLORIDE 0.9 % IV SOLN
INTRAVENOUS | Status: AC
  Administered 2016-04-10: 23:00:00 via INTRAVENOUS

## 2016-04-10 NOTE — ED Provider Notes (Signed)
[facility] DEPT Provider Note   CSN: [id number] Arrival date & time: 04/10/16  1708     History   Chief Complaint Chief Complaint  Patient presents with  . Weakness  . Fall    HPI [name] Graham is a 80 y.o. male.  HPI Patient presents for episode of nausea, diaphoresis and generalized weakness. States he fell onto the ground but did not lose consciousness. Denies hitting his head. This happened at roughly 11 AM this morning after working on his automobile. Went to urgent care and had central dyspepsia. States he took 2 nitroglycerin that completely relieved with this. Denies any current chest discomfort. Past Medical History:  Diagnosis Date  . COPD (chronic obstructive pulmonary disease) ([place])   . Coronary atherosclerosis of native coronary artery    BMS circ and PTCA PDA 2007, DES RCA 10/09, DES LAD 2/12  . Essential hypertension, benign   . GERD (gastroesophageal reflux disease)   . Hyperlipidemia   . NSTEMI (non-ST elevated myocardial infarction) [facility])    2007    Patient Active Problem List   Diagnosis Date Noted  . Healthcare maintenance 05/16/2013  . TOBACCO ABUSE 01/22/2010  . Essential hypertension, benign 01/15/2009  . CORONARY ATHEROSCLEROSIS NATIVE CORONARY ARTERY 01/15/2009  . Mixed hyperlipidemia 01/14/2009  . COPD 01/14/2009    Past Surgical History:  Procedure Laterality Date  . LIPOMA EXCISION Right        Home Medications    Prior to Admission medications   Medication Sig Start Date End Date Taking? Authorizing Provider  albuterol (PROVENTIL HFA;VENTOLIN HFA) 108 (90 Base) MCG/ACT inhaler Inhale 1-2 puffs into the lungs every 6 (six) hours as needed for wheezing or shortness of breath.   Yes Historical Provider, MD  aspirin 325 MG tablet Take 325 mg by mouth 2 (two) times daily.    Yes Historical Provider, MD  clopidogrel (PLAVIX) 75 MG tablet Take 75 mg by mouth daily.   Yes Historical Provider, MD  HYDROcodone-acetaminophen (NORCO)  7.5-325 MG tablet Take 1 tablet by mouth every 6 (six) hours as needed for moderate pain.    Yes Historical Provider, MD  lisinopril (PRINIVIL,ZESTRIL) 40 MG tablet Take 40 mg by mouth daily.   Yes Historical Provider, MD  metoprolol tartrate (LOPRESSOR) 25 MG tablet Take 1 tablet (25 mg total) by mouth daily. 03/05/16  Yes Satira Sark, MD  nitroGLYCERIN (NITROSTAT) 0.4 MG SL tablet Place 1 tablet (0.4 mg total) under the tongue every 5 (five) minutes as needed for chest pain. 10/23/15  Yes Satira Sark, MD  pantoprazole (PROTONIX) 40 MG tablet Take 40 mg by mouth daily.   Yes Historical Provider, MD  simvastatin (ZOCOR) 40 MG tablet Take 40 mg by mouth at bedtime.    Yes Historical Provider, MD    Family History Family History  Problem Relation Age of Onset  . Cancer Father   . Heart attack Mother     Social History Social History  Substance Use Topics  . Smoking status: Current Every Day Smoker    Packs/day: 1.00    Types: Cigarettes    Start date: 06/15/1953  . Smokeless tobacco: Never Used     Comment: 1 pack a day  . Alcohol use No     Allergies   Patient has no known allergies.   Review of Systems Review of Systems  Constitutional: Positive for diaphoresis, fatigue and fever. Negative for chills.  Respiratory: Negative for cough and shortness of breath.   Cardiovascular: Positive  for chest pain. Negative for palpitations and leg swelling.  Gastrointestinal: Positive for nausea. Negative for constipation, diarrhea and vomiting.  Genitourinary: Negative for dysuria, flank pain, frequency and hematuria.  Musculoskeletal: Negative for back pain, myalgias, neck pain and neck stiffness.  Skin: Negative for rash.  Neurological: Positive for dizziness, weakness and light-headedness. Negative for syncope, numbness and headaches.  All other systems reviewed and are negative.    Physical Exam Updated Vital Signs BP 151/81   Pulse (!) 54   Temp 97.5 F (36.4 C)    Resp 18   Ht '5\' 10"'$  (1.778 m)   Wt 150 lb 4 oz (68.2 kg)   SpO2 99%   BMI 21.56 kg/m   Physical Exam  Constitutional: He is oriented to person, place, and time. He appears well-developed and well-nourished. No distress.  HENT:  Head: Normocephalic and atraumatic.  Mouth/Throat: Oropharynx is clear and moist. No oropharyngeal exudate.  Eyes: EOM are normal. Pupils are equal, round, and reactive to light.  Neck: Normal range of motion. Neck supple.  No posterior midline cervical tenderness to palpation. No meningismus.  Cardiovascular: Normal rate and regular rhythm.  Exam reveals no gallop and no friction rub.   No murmur heard. Pulmonary/Chest: Effort normal and breath sounds normal. No respiratory distress. He has no wheezes. He has no rales. He exhibits no tenderness.  Abdominal: Soft. Bowel sounds are normal. There is no tenderness. There is no rebound and no guarding.  Musculoskeletal: Normal range of motion. He exhibits no edema or tenderness.  No lower extremity swelling, asymmetry or tenderness. Distal pulses are equal. No midline thoracic or lumbar tenderness. Pelvis stable.  Neurological: He is alert and oriented to person, place, and time.  Patient is alert and oriented x3 with clear, goal oriented speech. Patient has 5/5 motor in all extremities. Sensation is intact to light touch.   Skin: Skin is warm and dry. Capillary refill takes less than 2 seconds. No rash noted. No erythema.  Psychiatric: He has a normal mood and affect. His behavior is normal.  Nursing note and vitals reviewed.    ED Treatments / Results  Labs (all labs ordered are listed, but only abnormal results are displayed) Labs Reviewed  COMPREHENSIVE METABOLIC PANEL - Abnormal; Notable for the following:       Result Value   Glucose, Bld 146 (*)    ALT 12 (*)    All other components within normal limits  CBC WITH DIFFERENTIAL/PLATELET  TROPONIN I  URINALYSIS, ROUTINE W REFLEX MICROSCOPIC    EKG   EKG Interpretation  Date/Time:  Saturday April 10 2016 17:45:57 EST Ventricular Rate:  57 PR Interval:    QRS Duration: 122 QT Interval:  446 QTC Calculation: 435 R Axis:   -66 Text Interpretation:  Sinus rhythm Nonspecific IVCD with LAD LVH with secondary repolarization abnormality Confirmed by Lita Mains  MD, Ferrell Flam ([id number]) on 04/10/2016 7:38:39 PM       Radiology Dg Chest 2 View  Result Date: 04/10/2016 CLINICAL DATA:  Syncopal episode today, chest pain, weakness, hx of 9 cardiac stents per patient EXAM: CHEST  2 VIEW COMPARISON:  06/15/2013 FINDINGS: Heart size is within normal limits and stable. Lungs are mildly hyperinflated. Biapical pleuroparenchymal changes are stable. There are no focal consolidations or pleural effusions. No pulmonary edema. IMPRESSION: No evidence for acute cardiopulmonary abnormality. Electronically Signed   By: Nolon Nations M.D.   On: 04/10/2016 18:28    Procedures Procedures (including critical care time)  Medications Ordered  in ED Medications - No data to display   Initial Impression / Assessment and Plan / ED Course  I have reviewed the triage vital signs and the nursing notes.  Pertinent labs & imaging results that were available during my care of the patient were reviewed by me and considered in my medical decision making (see chart for details).  Clinical Course     Patient remains asymptomatic in the emergency department. EKG is essentially unchanged from previous. Troponin is normal. Discussed with hospitalist and we'll admit for overnight observation.  Final Clinical Impressions(s) / ED Diagnoses   Final diagnoses:  Chest pain, unspecified type  Near syncope    New Prescriptions New Prescriptions   No medications on file     Julianne Rice, MD 04/10/16 1950

## 2016-04-10 NOTE — H&P (Signed)
History and Physical    UZIAH SORTER Graham:[id number] DOB: 10-Jun-1935 DOA: 04/10/2016  PCP: Kenn File, MD  Patient coming from: Home.    Chief Complaint:  Lightheadedness, almost passed out, and chest discomfort relieved with NTG.   HPI: [name] Graham is an 80 y.o. male with hx of known CAD, and stable angina, followed by Dr Domenic Polite of cardiology, hx of COPD, HLD, prior MI and CAD with stents (BMS circ and PTCA PDA 2007, DES RCA 10/09, DES LAD 2/12) presented to urgent clinic and was sent here to ED as he experienced lightheadedness and presyncopal symptoms without LOC working on his car today.  He denied SOB, but has his usual chest discomfort relieved with NTG as he has done in the past.  Work up in the ER included an EKG which did not show any ST T changes, and negative troponins.  His serology was unremarkable.  Due to his known CAD, hospitalist was asked to admit him for cardiac r/out.  He had a low risk stress test last year per Dr Domenic Polite office note on Sept 22, 2017.  He is comfortable in the ER without any complaints.   ED Course:  See above.   Rewiew of Systems:  Constitutional: Negative for malaise, fever and chills. No significant weight loss or weight gain Eyes: Negative for eye pain, redness and discharge, diplopia, visual changes, or flashes of light. ENMT: Negative for ear pain, hoarseness, nasal congestion, sinus pressure and sore throat. No headaches; tinnitus, drooling, or problem swallowing. Cardiovascular: Negative for  palpitations, diaphoresis, dyspnea and peripheral edema. ; No orthopnea, PND Respiratory: Negative for cough, hemoptysis, wheezing and stridor. No pleuritic chestpain. Gastrointestinal: Negative for diarrhea, constipation,  melena, blood in stool, hematemesis, jaundice and rectal bleeding.    Genitourinary: Negative for frequency, dysuria, incontinence,flank pain and hematuria; Musculoskeletal: Negative for back pain and neck pain. Negative for  swelling and trauma.;  Skin: . Negative for pruritus, rash, abrasions, bruising and skin lesion.; ulcerations Neuro: Negative for headache, lightheadedness and neck stiffness. Negative for weakness, altered level of consciousness , altered mental status, extremity weakness, burning feet, involuntary movement, seizure and syncope.  Psych: negative for anxiety, depression, insomnia, tearfulness, panic attacks, hallucinations, paranoia, suicidal or homicidal ideation    Past Medical History:  Diagnosis Date  . COPD (chronic obstructive pulmonary disease) ([place])   . Coronary atherosclerosis of native coronary artery    BMS circ and PTCA PDA 2007, DES RCA 10/09, DES LAD 2/12  . Essential hypertension, benign   . GERD (gastroesophageal reflux disease)   . Hyperlipidemia   . NSTEMI (non-ST elevated myocardial infarction) ([place])    2007    Past Surgical History:  Procedure Laterality Date  . LIPOMA EXCISION Right      reports that he has been smoking Cigarettes.  He started smoking about 62 years ago. He has been smoking about 1.00 pack per day. He has never used smokeless tobacco. He reports that he does not drink alcohol or use drugs.  No Known Allergies  Family History  Problem Relation Age of Onset  . Cancer Father   . Heart attack Mother      Prior to Admission medications   Medication Sig Start Date End Date Taking? Authorizing Provider  albuterol (PROVENTIL HFA;VENTOLIN HFA) 108 (90 Base) MCG/ACT inhaler Inhale 1-2 puffs into the lungs every 6 (six) hours as needed for wheezing or shortness of breath.   Yes Historical Provider, MD  aspirin 325 MG tablet Take 325  mg by mouth 2 (two) times daily.    Yes Historical Provider, MD  clopidogrel (PLAVIX) 75 MG tablet Take 75 mg by mouth daily.   Yes Historical Provider, MD  HYDROcodone-acetaminophen (NORCO) 7.5-325 MG tablet Take 1 tablet by mouth every 6 (six) hours as needed for moderate pain.    Yes Historical Provider, MD  lisinopril  (PRINIVIL,ZESTRIL) 40 MG tablet Take 40 mg by mouth daily.   Yes Historical Provider, MD  metoprolol tartrate (LOPRESSOR) 25 MG tablet Take 1 tablet (25 mg total) by mouth daily. 03/05/16  Yes Satira Sark, MD  nitroGLYCERIN (NITROSTAT) 0.4 MG SL tablet Place 1 tablet (0.4 mg total) under the tongue every 5 (five) minutes as needed for chest pain. 10/23/15  Yes Satira Sark, MD  pantoprazole (PROTONIX) 40 MG tablet Take 40 mg by mouth daily.   Yes Historical Provider, MD  simvastatin (ZOCOR) 40 MG tablet Take 40 mg by mouth at bedtime.    Yes Historical Provider, MD    Physical Exam: Vitals:   04/10/16 1713 04/10/16 1900 04/10/16 1930 04/10/16 2000  BP: 175/73 140/73 151/81 151/86  Pulse: 65 (!) 51 (!) 54 (!) 55  Resp: '16 16 18 14  '$ Temp: 97.5 F (36.4 C)     SpO2: 99% 97% 99% 94%  Weight: 68.2 kg (150 lb 4 oz)     Height: '5\' 10"'$  (1.778 m)         Constitutional: NAD, calm, comfortable Vitals:   04/10/16 1713 04/10/16 1900 04/10/16 1930 04/10/16 2000  BP: 175/73 140/73 151/81 151/86  Pulse: 65 (!) 51 (!) 54 (!) 55  Resp: '16 16 18 14  '$ Temp: 97.5 F (36.4 C)     SpO2: 99% 97% 99% 94%  Weight: 68.2 kg (150 lb 4 oz)     Height: '5\' 10"'$  (1.778 m)      Eyes: PERRL, lids and conjunctivae normal ENMT: Mucous membranes are moist. Posterior pharynx clear of any exudate or lesions.Normal dentition.  Neck: normal, supple, no masses, no thyromegaly Respiratory: clear to auscultation bilaterally, no wheezing, no crackles. Normal respiratory effort. No accessory muscle use.  Cardiovascular: Regular rate and rhythm, no murmurs / rubs / gallops. No extremity edema. 2+ pedal pulses. No carotid bruits.  Abdomen: no tenderness, no masses palpated. No hepatosplenomegaly. Bowel sounds positive.  Musculoskeletal: no clubbing / cyanosis. No joint deformity upper and lower extremities. Good ROM, no contractures. Normal muscle tone.  Skin: no rashes, lesions, ulcers. No induration Neurologic:  CN 2-12 grossly intact. Sensation intact, DTR normal. Strength 5/5 in all 4.  Psychiatric: Normal judgment and insight. Alert and oriented x 3. Normal mood.     Labs on Admission: I have personally reviewed following labs and imaging studies  CBC:  Recent Labs Lab 04/10/16 1739  WBC 8.9  NEUTROABS 6.5  HGB 15.8  HCT 45.6  MCV 96.6  PLT 196   Basic Metabolic Panel:  Recent Labs Lab 04/10/16 1739  NA 137  K 3.7  CL 103  CO2 26  GLUCOSE 146*  BUN 14  CREATININE 0.98  CALCIUM 9.0   GFR: Estimated Creatinine Clearance: 59.9 mL/min (by C-G formula based on SCr of 0.98 mg/dL). Liver Function Tests:  Recent Labs Lab 04/10/16 1739  AST 16  ALT 12*  ALKPHOS 49  BILITOT 0.6  PROT 7.0  ALBUMIN 4.0   Cardiac Enzymes:  Recent Labs Lab 04/10/16 1739  TROPONINI <0.03    Radiological Exams on Admission: Dg Chest 2 View  Result Date: 04/10/2016 CLINICAL DATA:  Syncopal episode today, chest pain, weakness, hx of 9 cardiac stents per patient EXAM: CHEST  2 VIEW COMPARISON:  06/15/2013 FINDINGS: Heart size is within normal limits and stable. Lungs are mildly hyperinflated. Biapical pleuroparenchymal changes are stable. There are no focal consolidations or pleural effusions. No pulmonary edema. IMPRESSION: No evidence for acute cardiopulmonary abnormality. Electronically Signed   By: Nolon Nations M.D.   On: 04/10/2016 18:28    EKG: Independently reviewed.   Assessment/Plan Active Problems:   Mixed hyperlipidemia   TOBACCO ABUSE   Essential hypertension, benign   CORONARY ATHEROSCLEROSIS NATIVE CORONARY ARTERY   Lightheadedness   Chest pain   Atypical chest pain    PLAN:   Atypical CP:  I don't think he has ACS, and that his pattern of chest discomfort has been in a stable pattern c/w stable angina.  His is a little bradycardic, but has this for several months, and is on very low dose of Betablocker.  I would continue with it, as it is a good medical Tx for  his CAD.  Will continue with his meds including statin and DUAT, and cycle his troponins.  Obtain ECHO if possible.    HLD:  Continue with statin.  Lightheadedness:  Mild, and he has bradycardia,  Will continue with his low dose Betablocker.  Obtain ECHO.    DVT prophylaxis: SUBQ heparin.  Code Status: FULL CODE.  Family Communication: WIfe at bedside.  Disposition Plan: Home when appropriate.  Consults called: None. Admission status: OBS.    Tierra Thoma MD FACP. [facility]  If 7PM-7AM, please contact night-coverage [email or web address] Password [facility]  04/10/2016, 8:36 PM

## 2016-04-10 NOTE — ED Triage Notes (Addendum)
Pt reports he was working on his car today and when he walked inside he started to feel unsteady and then fell down. Pt denies LOC. Pt reports he felt as if he had indigestion at the time which is the same way he has felt in the past with prior heart attacks. Pt took 2 Nitro at home with relief of pain. Pt denies CP, dizziness, SOB at this time. Pt went to urgent care and the EKG machine wasn't working so they sent pt to ED for evaluation per pt.

## 2016-04-10 NOTE — ED Notes (Signed)
Report given to floor nurse, all questions answered

## 2016-04-11 ENCOUNTER — Observation Stay (HOSPITAL_BASED_OUTPATIENT_CLINIC_OR_DEPARTMENT_OTHER): Payer: Medicare HMO

## 2016-04-11 DIAGNOSIS — R0789 Other chest pain: Secondary | ICD-10-CM

## 2016-04-11 DIAGNOSIS — I10 Essential (primary) hypertension: Secondary | ICD-10-CM

## 2016-04-11 DIAGNOSIS — R55 Syncope and collapse: Secondary | ICD-10-CM

## 2016-04-11 LAB — TROPONIN I: Troponin I: <0.03 ng/mL (ref <0.03)

## 2016-04-11 LAB — ECHOCARDIOGRAM COMPLETE
Height: 70 in
Weight: 2342.17 oz

## 2016-04-11 NOTE — Progress Notes (Signed)
Pt IV and telemetry removed, tolerated well.  Reviewed discharge instructions with pt and answered questions at this time.   

## 2016-04-11 NOTE — Progress Notes (Signed)
  Echocardiogram 2D Echocardiogram has been performed.  [name] Graham 04/11/2016, 9:17 AM

## 2016-04-11 NOTE — Discharge Summary (Signed)
Physician Discharge Summary  [name] Graham GGY:[id number] DOB: 05-23-1935 DOA: 04/10/2016  PCP: Kenn File, MD  Admit date: 04/10/2016 Discharge date: 04/11/2016  Admitted From:home Disposition:home  Recommendations for Outpatient Follow-up:  1. Follow up with PCP in 1-2 weeks 2. Please obtain BMP/CBC in one week 3. Please repeat thyroid function test with your PCP in 4-6 weeks.   Home Health: No Equipment/Devices: No Discharge Condition: Stable CODE STATUS: Full code Diet recommendation: Heart healthy  Brief/Interim Summary:80 y.o. male with hx of known CAD, and stable angina, followed by Dr Domenic Polite of cardiology, hx of COPD, HLD, prior MI and CAD with stents (BMS circ and PTCA PDA 2007, DES RCA 10/09, DES LAD 2/12) presented to urgent clinic and was sent here to ED as he experienced lightheadedness and presyncopal symptoms without LOC.He denied SOB, but has his usual chest discomfort relieved with NTG as he has done in the past.  Work up in the ER included an EKG which did not show any ST T changes, and negative troponins.Due to his known CAD, hospitalist was asked to admit him for cardiac r/out.  He had a low risk stress test last year per Dr Domenic Polite office note on Sept 22, 2017.  The patient was observed in the telemetry without significant event. Patient has sinus bradycardia for which he is on metoprolol. Heart rate fluctuates from mid 50s to 70s. Given coronary artery disease I did not change the dose of metoprolol. Patient reported that he can follow up with his cardiologist in 1-2 days to discuss about the medication and heart rate. 3 sets of troponin negative. Patient has no focal neurological deficit. Neurological exam normal. Echocardiogram done today. The result reviewed with Dr. Harl Bowie from cardiologist. He has left ventricular ejection fraction of 50-55%. There is indeterminate grade  diastolic dysfunction.  Patient is discharged home in stable condition. I also  reviewed the discharge planning with the patient and his wife at bedside. I recommended them to follow up with PCP and cardiologist within a week. I advised to monitor blood pressure and heart rate at home.  Discharge Diagnoses:  Active Problems:   Mixed hyperlipidemia   TOBACCO ABUSE   Essential hypertension, benign   CORONARY ATHEROSCLEROSIS NATIVE CORONARY ARTERY   Lightheadedness   Chest pain   Atypical chest pain    Discharge Instructions  Discharge Instructions    Call MD for:  difficulty breathing, headache or visual disturbances    Complete by:  As directed    Call MD for:  hives    Complete by:  As directed    Call MD for:  persistant dizziness or light-headedness    Complete by:  As directed    Call MD for:  persistant nausea and vomiting    Complete by:  As directed    Call MD for:  severe uncontrolled pain    Complete by:  As directed    Diet - low sodium heart healthy    Complete by:  As directed    Discharge instructions    Complete by:  As directed    Please follow up with your cardiologist and PCP. Please check TSH with your PCP in 4-6 weeks.   Increase activity slowly    Complete by:  As directed        Medication List    TAKE these medications   albuterol 108 (90 Base) MCG/ACT inhaler Commonly known as:  PROVENTIL HFA;VENTOLIN HFA Inhale 1-2 puffs into the lungs every 6 (six) hours  as needed for wheezing or shortness of breath.   aspirin 325 MG tablet Take 325 mg by mouth 2 (two) times daily.   clopidogrel 75 MG tablet Commonly known as:  PLAVIX Take 75 mg by mouth daily.   HYDROcodone-acetaminophen 7.5-325 MG tablet Commonly known as:  NORCO Take 1 tablet by mouth every 6 (six) hours as needed for moderate pain.   lisinopril 40 MG tablet Commonly known as:  PRINIVIL,ZESTRIL Take 40 mg by mouth daily.   metoprolol tartrate 25 MG tablet Commonly known as:  LOPRESSOR Take 1 tablet (25 mg total) by mouth daily.   nitroGLYCERIN 0.4 MG SL  tablet Commonly known as:  NITROSTAT Place 1 tablet (0.4 mg total) under the tongue every 5 (five) minutes as needed for chest pain.   pantoprazole 40 MG tablet Commonly known as:  PROTONIX Take 40 mg by mouth daily.   simvastatin 40 MG tablet Commonly known as:  ZOCOR Take 40 mg by mouth at bedtime.      Follow-up Information    Kenn File, MD. Schedule an appointment as soon as possible for a visit in 1 week(s).   Specialty:  Family Medicine Contact information: [place] [address] [phone]          No Known Allergies  Consultations: None  Procedures/Studies: Echocardiogram Study Conclusions  - Left ventricle: The cavity size was normal. Wall thickness was   increased in a pattern of moderate LVH. Systolic function was   normal. The estimated ejection fraction was in the range of 50%   to 55%. Enhaut function is abnormal, indeterminate grade. Wall   motion was normal; there were no regional wall motion   abnormalities. - Aortic valve: There was trivial regurgitation. Valve area (VTI):   2.83 cm^2. Valve area (Vmax): 3.32 cm^2. Valve area (Vmean): 2.67   cm^2. - Mitral valve: There was mild regurgitation. - Left atrium: The atrium was mildly dilated. - Atrial septum: No defect or patent foramen ovale was identified. - Techncally adequate study.  Subjective: Patient was seen and examined at bedside. Denied headache, dizziness, nausea, vomiting, chest pain, shortness of breath, abdominal pain. As per patient's wife, he is at baseline.   Discharge Exam: Vitals:   04/11/16 0627 04/11/16 1002  BP: 139/69 (!) 142/68  Pulse: 61 (!) 54  Resp: 17 18  Temp: 98.2 F (36.8 C) 97.5 F (36.4 C)   Vitals:   04/10/16 2130 04/10/16 2156 04/11/16 0627 04/11/16 1002  BP: 139/71 131/90 139/69 (!) 142/68  Pulse: (!) 57 74 61 (!) 54  Resp: '17  17 18  '$ Temp:  97.6 F (36.4 C) 98.2 F (36.8 C) 97.5 F (36.4 C)  TempSrc:  Oral Oral Oral  SpO2:  96% 92% 95% 97%  Weight:  66.4 kg (146 lb 6.2 oz)    Height:  '5\' 10"'$  (1.778 m)      General: Pt is alert, awake, not in acute distress Eye: EOMI Moist mucous movement. Cardiovascular: RRR, S1/S2 +, no rubs, no gallops Respiratory: CTA bilaterally, no wheezing, no rhonchi Abdominal: Soft, NT, ND, bowel sounds + Extremities: no edema, no cyanosis Neurologic: Alert, awake, oriented. Muscle strength 5 over 5 in all extremities, symmetric.   The results of significant diagnostics from this hospitalization (including imaging, microbiology, ancillary and laboratory) are listed below for reference.     Microbiology: No results found for this or any previous visit (from the past 240 hour(s)).   Labs: BNP (last 3 results) No  results for input(s): BNP in the last 8760 hours. Basic Metabolic Panel:  Recent Labs Lab 04/10/16 1739 04/10/16 2154  NA 137  --   K 3.7  --   CL 103  --   CO2 26  --   GLUCOSE 146*  --   BUN 14  --   CREATININE 0.98 0.88  CALCIUM 9.0  --    Liver Function Tests:  Recent Labs Lab 04/10/16 1739  AST 16  ALT 12*  ALKPHOS 49  BILITOT 0.6  PROT 7.0  ALBUMIN 4.0   No results for input(s): LIPASE, AMYLASE in the last 168 hours. No results for input(s): AMMONIA in the last 168 hours. CBC:  Recent Labs Lab 04/10/16 1739 04/10/16 2154  WBC 8.9 6.9  NEUTROABS 6.5  --   HGB 15.8 16.0  HCT 45.6 46.3  MCV 96.6 96.7  PLT 183 181   Cardiac Enzymes:  Recent Labs Lab 04/10/16 1739 04/10/16 2154 04/11/16 0407 04/11/16 1006  TROPONINI <0.03 <0.03 <0.03 <0.03   BNP: Invalid input(s): POCBNP CBG: No results for input(s): GLUCAP in the last 168 hours. D-Dimer No results for input(s): DDIMER in the last 72 hours. Hgb A1c No results for input(s): HGBA1C in the last 72 hours. Lipid Profile No results for input(s): CHOL, HDL, LDLCALC, TRIG, CHOLHDL, LDLDIRECT in the last 72 hours. Thyroid function studies  Recent Labs  04/10/16 2154  TSH  4.890*   Anemia work up No results for input(s): VITAMINB12, FOLATE, FERRITIN, TIBC, IRON, RETICCTPCT in the last 72 hours. Urinalysis No results found for: COLORURINE, APPEARANCEUR, LABSPEC, [place], GLUCOSEU, HGBUR, BILIRUBINUR, KETONESUR, PROTEINUR, UROBILINOGEN, NITRITE, LEUKOCYTESUR Sepsis Labs Invalid input(s): PROCALCITONIN,  WBC,  LACTICIDVEN Microbiology No results found for this or any previous visit (from the past 240 hour(s)).   Time coordinating discharge: Over 30 minutes  SIGNED:   Rosita Fire, MD  [facility] 04/11/2016, 12:25 PM  If 7PM-7AM, please contact night-coverage [email or web address] Password [email or web address]

## 2016-04-13 ENCOUNTER — Encounter: Payer: Self-pay | Admitting: Cardiology

## 2016-04-13 ENCOUNTER — Ambulatory Visit (INDEPENDENT_AMBULATORY_CARE_PROVIDER_SITE_OTHER): Payer: Medicare HMO | Admitting: Cardiology

## 2016-04-13 VITALS — BP 138/82 | HR 56 | Ht 70.0 in | Wt 160.0 lb

## 2016-04-13 DIAGNOSIS — R55 Syncope and collapse: Secondary | ICD-10-CM | POA: Diagnosis not present

## 2016-04-13 DIAGNOSIS — I10 Essential (primary) hypertension: Secondary | ICD-10-CM | POA: Diagnosis not present

## 2016-04-13 DIAGNOSIS — E782 Mixed hyperlipidemia: Secondary | ICD-10-CM

## 2016-04-13 DIAGNOSIS — I25709 Atherosclerosis of coronary artery bypass graft(s), unspecified, with unspecified angina pectoris: Secondary | ICD-10-CM | POA: Diagnosis not present

## 2016-04-13 MED ORDER — METOPROLOL SUCCINATE ER 25 MG PO TB24: 12.5000 mg | ORAL_TABLET | Freq: Every day | ORAL | 3 refills | Status: DC

## 2016-04-13 MED ORDER — NITROGLYCERIN 0.4 MG SL SUBL: 0.4000 mg | SUBLINGUAL_TABLET | SUBLINGUAL | 3 refills | Status: DC | PRN
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
## 2016-04-13 NOTE — Patient Instructions (Addendum)
Medication Instructions:   Stop Lopressor (Metorprolol tart).  Begin Toprol XL 12.'5mg'$  daily.  Continue all other medications.    Labwork: none  Testing/Procedures: none  Follow-Up: Your physician wants you to follow up in: 6 months.  You will receive a reminder letter in the mail one-two months in advance.  If you don't receive a letter, please call our office to schedule the follow up appointment   Any Other Special Instructions Will Be Listed Below (If Applicable).  If you need a refill on your cardiac medications before your next appointment, please call your pharmacy.

## 2016-05-24 ENCOUNTER — Telehealth: Payer: Self-pay | Admitting: Cardiology

## 2016-05-24 DIAGNOSIS — M1711 Unilateral primary osteoarthritis, right knee: Secondary | ICD-10-CM | POA: Diagnosis not present

## 2016-05-24 DIAGNOSIS — G8929 Other chronic pain: Secondary | ICD-10-CM | POA: Diagnosis not present

## 2016-05-24 DIAGNOSIS — Z79899 Other long term (current) drug therapy: Secondary | ICD-10-CM | POA: Diagnosis not present

## 2016-05-24 DIAGNOSIS — M5136 Other intervertebral disc degeneration, lumbar region: Secondary | ICD-10-CM | POA: Diagnosis not present

## 2016-05-24 DIAGNOSIS — M47817 Spondylosis without myelopathy or radiculopathy, lumbosacral region: Secondary | ICD-10-CM | POA: Diagnosis not present

## 2016-05-24 NOTE — Telephone Encounter (Signed)
Pt.notified

## 2016-05-24 NOTE — Telephone Encounter (Signed)
Generally Plavix will need to be held 7 days prior to an epidural injection.

## 2016-05-24 NOTE — Telephone Encounter (Signed)
Pt is needing to have an injection in his spine, please let pt know how long he needs to be off Plavix prior to this

## 2016-05-24 NOTE — Telephone Encounter (Signed)
Will forward to Dr Domenic Polite for direction

## 2016-06-07 ENCOUNTER — Telehealth: Payer: Self-pay | Admitting: Cardiology

## 2016-06-07 NOTE — Telephone Encounter (Signed)
[name] Graham is having an injection next week. States that he is no longer taking Plavix. Was told by Dr. Ellouise Newer office to contact our office to see if any of his other medications need to be stopped.

## 2016-06-07 NOTE — Telephone Encounter (Signed)
Patient notified and verbalized understanding. 

## 2016-06-07 NOTE — Telephone Encounter (Signed)
Patient states that he has back injection scheduled for Monday, 06/14/16.  Was told to stop his meds 7 days prior to inj, but they did not specify which medications.  Further questioning did reveal that he is still on Plavix, gets the generic (Clopidogrel) & did not realize they were the same.  Also, he is still on ASA '325mg'$  twice a day.  Message sent to provider.

## 2016-06-07 NOTE — Telephone Encounter (Signed)
Yes, he would need to stop aspirin and Plavix 7 days prior to the injection.

## 2016-06-14 DIAGNOSIS — M47817 Spondylosis without myelopathy or radiculopathy, lumbosacral region: Secondary | ICD-10-CM | POA: Diagnosis not present

## 2016-06-14 DIAGNOSIS — M5136 Other intervertebral disc degeneration, lumbar region: Secondary | ICD-10-CM | POA: Diagnosis not present

## 2016-08-09 DIAGNOSIS — M5136 Other intervertebral disc degeneration, lumbar region: Secondary | ICD-10-CM | POA: Diagnosis not present

## 2016-08-09 DIAGNOSIS — Z79899 Other long term (current) drug therapy: Secondary | ICD-10-CM | POA: Diagnosis not present

## 2016-08-09 DIAGNOSIS — M1711 Unilateral primary osteoarthritis, right knee: Secondary | ICD-10-CM | POA: Diagnosis not present

## 2016-08-09 DIAGNOSIS — G8929 Other chronic pain: Secondary | ICD-10-CM | POA: Diagnosis not present

## 2016-08-09 DIAGNOSIS — M47817 Spondylosis without myelopathy or radiculopathy, lumbosacral region: Secondary | ICD-10-CM | POA: Diagnosis not present

## 2016-08-30 DIAGNOSIS — M5136 Other intervertebral disc degeneration, lumbar region: Secondary | ICD-10-CM | POA: Diagnosis not present

## 2016-08-30 DIAGNOSIS — M47817 Spondylosis without myelopathy or radiculopathy, lumbosacral region: Secondary | ICD-10-CM | POA: Diagnosis not present

## 2016-09-22 ENCOUNTER — Other Ambulatory Visit: Payer: Self-pay | Admitting: Cardiology

## 2016-09-23 ENCOUNTER — Other Ambulatory Visit: Payer: Self-pay | Admitting: Cardiology

## 2016-10-11 NOTE — Progress Notes (Signed)
Cardiology Office Note  Date: 10/13/2016   ID: [name] Graham, DOB [birth year], MRN [id number]  PCP: Patient, No Pcp Per  Primary Cardiologist: Rozann Lesches, MD   Chief Complaint  Patient presents with  . Coronary Artery Disease    History of Present Illness: [name] Graham is a 81 y.o. male last seen in December 2017. He presents for a routine follow-up visit. 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 He seems to be very satisfied with his work, although his family tells him that he needs to slow down. He does go to his [place] on the weekends.  He does not report any angina symptoms or increasing shortness of breath. He states that he has been compliant with his medications. Current cardiac regimen includes aspirin, Plavix, lisinopril, Toprol-XL, Zocor, and as needed nitroglycerin.  Myoview from 2016 is outlined below. Overall low risk study.  Past Medical History:  Diagnosis Date  . COPD (chronic obstructive pulmonary disease) ([place])   . Coronary atherosclerosis of native coronary artery    BMS circ and PTCA PDA 2007, DES RCA 10/09, DES LAD 2/12  . Essential hypertension, benign   . GERD (gastroesophageal reflux disease)   . Hyperlipidemia   . NSTEMI (non-ST elevated myocardial infarction) ([place])    2007    Past Surgical History:  Procedure Laterality Date  . LIPOMA EXCISION Right     Current Outpatient Prescriptions  Medication Sig Dispense Refill  . albuterol (PROVENTIL HFA;VENTOLIN HFA) 108 (90 Base) MCG/ACT inhaler Inhale 1-2 puffs into the lungs every 6 (six) hours as needed for wheezing or shortness of breath.    Marland Kitchen aspirin 325 MG tablet Take 325 mg by mouth 2 (two) times daily.     . clopidogrel (PLAVIX) 75 MG tablet Take 75 mg by mouth daily.    Marland Kitchen HYDROcodone-acetaminophen (NORCO) 7.5-325 MG tablet Take 1 tablet by mouth every 6 (six) hours as needed for moderate pain.     Marland Kitchen  lisinopril (PRINIVIL,ZESTRIL) 40 MG tablet Take 40 mg by mouth daily.    . metoprolol succinate (TOPROL XL) 25 MG 24 hr tablet Take 0.5 tablets (12.5 mg total) by mouth daily. 45 tablet 3  . nitroGLYCERIN (NITROSTAT) 0.4 MG SL tablet Place 1 tablet (0.4 mg total) under the tongue every 5 (five) minutes as needed for chest pain. 25 tablet 3  . pantoprazole (PROTONIX) 40 MG tablet Take 40 mg by mouth daily.    . simvastatin (ZOCOR) 40 MG tablet Take 40 mg by mouth at bedtime.      No current facility-administered medications for this visit.    Allergies:  Patient has no known allergies.   Social History: The patient  reports that he has been smoking Cigarettes.  He started smoking about 63 years ago. He has been smoking about 1.00 pack per day. He has never used smokeless tobacco. He reports that he does not drink alcohol or use drugs.   ROS:  Please see the history of present illness. Otherwise, complete review of systems is positive for back and knee pain.  All other systems are reviewed and negative.   Physical Exam: VS:  BP 120/72   Pulse 68   Ht 5\' 10"  (1.778 m)   Wt 145 lb (65.8 kg)   SpO2 95%   BMI 20.81 kg/m , BMI Body mass index is 20.81 kg/m.  Wt Readings from Last 3 Encounters:  10/13/16 145 lb (65.8 kg)  04/13/16 160 lb (72.6 kg)  04/10/16 146 lb 6.2 oz (66.4 kg)    Normally nourished appearing elderly male in no acute distress.  HEENT: Conjunctiva and lids normal, oropharynx with poor dentition.  Neck: Supple, no carotid bruits or elevated JVP.  Lungs: Clear with diminished breath sounds, nonlabored.  Cardiac: Regular rate and rhythm, no significant systolic murmur or S3.  Abdomen: Soft, nontender, bowel sounds present.  Extremities: No pitting edema legs. Skin: Warm and dry. Musculokeletal: No kyphosis. Neuropsychiatric: Alert and oriented 3, affect appropriate.  ECG: I personally reviewed the tracing from 04/10/2016 which showed sinus rhythm with left  anterior fascicular block and increased voltage.  Recent Labwork: 04/10/2016: ALT 12; AST 16; BUN 14; Creatinine, Ser 0.88; Hemoglobin 16.0; Platelets 181; Potassium 3.7; Sodium 137; TSH 4.890     Component Value Date/Time   CHOL 138 05/09/2014 0734   CHOL 144 06/15/2013 0931   TRIG 92 05/09/2014 0734   HDL 55 05/09/2014 0734   HDL 61 06/15/2013 0931   CHOLHDL 2.5 05/09/2014 0734   VLDL 18 05/09/2014 0734   LDLCALC 65 05/09/2014 0734   LDLCALC 69 06/15/2013 0931    Other Studies Reviewed Today:  Lexiscan Cardiolite 05/17/2014: FINDINGS: Baseline tracing shows normal sinus rhythm. Lexiscan bolus was given in standard fashion. Heart rate increased from 52 beats per min up to 83 beats per min, and blood pressure increased from 133/78 up to 175/80. No chest pain was reported. There were no diagnostic ST segment changes, occasional PACs were noted.  Analysis of the raw perfusion data finds adequate myocardial radiotracer uptake with diaphragmatic attenuation.  Perfusion: There is a moderate-sized, moderate intensity, fixed inferior defect from apex to base. There is also a very small, mild intensity, apical anterior defect that is partially reversible.  Wall Motion: Normal left ventricular wall motion. No left ventricular dilation.  Left Ventricular Ejection Fraction: 52 %  End diastolic volume 604 ml  End systolic volume 58 ml  IMPRESSION: 1. Evidence of diaphragmatic attenuation with fixed inferior defect as outlined, also a small region of apical ischemia noted. There are no large ischemic territories.  2. Normal left ventricular wall motion.  3. Left ventricular ejection fraction 52%  4. Low-risk stress test findings*.  Echocardiogram 04/11/2016: Study Conclusions  - Left ventricle: The cavity size was normal. Wall thickness was increased in a pattern of moderate LVH. Systolic function was normal. The estimated ejection fraction was in the range  of 50% to 55%. G[place]on function is abnormal, indeterminate grade. Wall motion was normal; there were no regional wall motion abnormalities. - Aortic valve: There was trivial regurgitation. Valve area (VTI): 2.83 cm^2. Valve area (Vmax): 3.32 cm^2. Valve area (Vmean): 2.67 cm^2. - Mitral valve: There was mild regurgitation. - Left atrium: The atrium was mildly dilated. - Atrial septum: No defect or patent foramen ovale was identified. - Techncally adequate study.  Assessment and Plan:  1. CAD status post multivessel distribution percutaneous interventions as described above, most recently in 2012. He reports no angina symptoms on medical therapy and had a low risk Myoview within the last 2 years. We will continue with observation for now.  2. Essential hypertension, blood pressure is well controlled today. No changes made to current medications.  3. Hyperlipidemia on Zocor.  4. Long-standing tobacco abuse. We have discussed smoking cessation but he has not been motivated to quit.  Current medicines were reviewed with the patient today.  Disposition: Follow-up in 6 months.  Signed, Satira Sark, MD, [facility] 10/13/2016 12:00 PM    [facility] [place] [facility] [place], [place], [place] [address] Phone: [phone]; Fax: [phone]

## 2016-10-13 ENCOUNTER — Ambulatory Visit: Payer: Medicare HMO | Admitting: Cardiology

## 2016-10-13 ENCOUNTER — Encounter: Payer: Self-pay | Admitting: Cardiology

## 2016-10-13 ENCOUNTER — Ambulatory Visit (INDEPENDENT_AMBULATORY_CARE_PROVIDER_SITE_OTHER): Payer: Medicare HMO | Admitting: Cardiology

## 2016-10-13 VITALS — BP 120/72 | HR 68 | Ht 70.0 in | Wt 145.0 lb

## 2016-10-13 DIAGNOSIS — I10 Essential (primary) hypertension: Secondary | ICD-10-CM

## 2016-10-13 DIAGNOSIS — I25810 Atherosclerosis of coronary artery bypass graft(s) without angina pectoris: Secondary | ICD-10-CM

## 2016-10-13 DIAGNOSIS — F17200 Nicotine dependence, unspecified, uncomplicated: Secondary | ICD-10-CM

## 2016-10-13 DIAGNOSIS — R69 Illness, unspecified: Secondary | ICD-10-CM | POA: Diagnosis not present

## 2016-10-13 DIAGNOSIS — E782 Mixed hyperlipidemia: Secondary | ICD-10-CM

## 2016-10-13 NOTE — Patient Instructions (Signed)

## 2016-11-23 ENCOUNTER — Emergency Department (HOSPITAL_COMMUNITY): Payer: No Typology Code available for payment source

## 2016-11-23 ENCOUNTER — Emergency Department (HOSPITAL_COMMUNITY)
Admission: EM | Admit: 2016-11-23 | Discharge: 2016-11-23 | Disposition: A | Discharge status: Home / Self Care | Payer: No Typology Code available for payment source | Attending: Emergency Medicine | Admitting: Emergency Medicine

## 2016-11-23 ENCOUNTER — Encounter (HOSPITAL_COMMUNITY): Payer: Self-pay | Admitting: Emergency Medicine

## 2016-11-23 DIAGNOSIS — Z7982 Long term (current) use of aspirin: Secondary | ICD-10-CM | POA: Insufficient documentation

## 2016-11-23 DIAGNOSIS — Z7902 Long term (current) use of antithrombotics/antiplatelets: Secondary | ICD-10-CM | POA: Diagnosis not present

## 2016-11-23 DIAGNOSIS — F17210 Nicotine dependence, cigarettes, uncomplicated: Secondary | ICD-10-CM | POA: Diagnosis not present

## 2016-11-23 DIAGNOSIS — I2510 Atherosclerotic heart disease of native coronary artery without angina pectoris: Secondary | ICD-10-CM | POA: Insufficient documentation

## 2016-11-23 DIAGNOSIS — Y92410 Unspecified street and highway as the place of occurrence of the external cause: Secondary | ICD-10-CM | POA: Diagnosis not present

## 2016-11-23 DIAGNOSIS — R69 Illness, unspecified: Secondary | ICD-10-CM | POA: Diagnosis not present

## 2016-11-23 DIAGNOSIS — S161XXA Strain of muscle, fascia and tendon at neck level, initial encounter: Secondary | ICD-10-CM | POA: Diagnosis not present

## 2016-11-23 DIAGNOSIS — J449 Chronic obstructive pulmonary disease, unspecified: Secondary | ICD-10-CM | POA: Insufficient documentation

## 2016-11-23 DIAGNOSIS — S199XXA Unspecified injury of neck, initial encounter: Secondary | ICD-10-CM | POA: Diagnosis present

## 2016-11-23 DIAGNOSIS — M545 Low back pain: Secondary | ICD-10-CM | POA: Diagnosis not present

## 2016-11-23 DIAGNOSIS — R911 Solitary pulmonary nodule: Secondary | ICD-10-CM | POA: Diagnosis not present

## 2016-11-23 DIAGNOSIS — Z79899 Other long term (current) drug therapy: Secondary | ICD-10-CM | POA: Diagnosis not present

## 2016-11-23 DIAGNOSIS — I10 Essential (primary) hypertension: Secondary | ICD-10-CM | POA: Insufficient documentation

## 2016-11-23 DIAGNOSIS — Y999 Unspecified external cause status: Secondary | ICD-10-CM | POA: Insufficient documentation

## 2016-11-23 DIAGNOSIS — Y9389 Activity, other specified: Secondary | ICD-10-CM | POA: Insufficient documentation

## 2016-11-23 DIAGNOSIS — M542 Cervicalgia: Secondary | ICD-10-CM | POA: Diagnosis not present

## 2016-11-23 DIAGNOSIS — R51 Headache: Secondary | ICD-10-CM | POA: Diagnosis not present

## 2016-11-23 DIAGNOSIS — S0990XA Unspecified injury of head, initial encounter: Secondary | ICD-10-CM | POA: Diagnosis not present

## 2016-11-23 MED ORDER — HYDROCODONE-ACETAMINOPHEN 5-325 MG PO TABS: ORAL_TABLET | ORAL | 0 refills | Status: DC

## 2016-11-23 NOTE — ED Notes (Signed)
Pt made aware to return if symptoms worsen or if any life threatening symptoms occur.   
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
## 2016-11-23 NOTE — ED Notes (Signed)
Pt returned from CT °

## 2016-11-23 NOTE — Discharge Instructions (Signed)
Apply ice packs on and off to your neck.  Call one of the providers listed above to arrange a follow-up appointment. As discussed, you will need a follow-up CT of your chest to evaluate the nodule on your lung. Return to the ER for any worsening symptoms.

## 2016-12-08 ENCOUNTER — Ambulatory Visit (INDEPENDENT_AMBULATORY_CARE_PROVIDER_SITE_OTHER): Payer: Medicare HMO | Admitting: Internal Medicine

## 2016-12-08 ENCOUNTER — Institutional Professional Consult (permissible substitution): Payer: Medicare HMO | Admitting: Emergency Medicine

## 2016-12-08 ENCOUNTER — Encounter: Payer: Self-pay | Admitting: Internal Medicine

## 2016-12-08 VITALS — BP 148/80 | HR 60 | Ht 70.0 in | Wt 143.4 lb

## 2016-12-08 DIAGNOSIS — R05 Cough: Secondary | ICD-10-CM | POA: Diagnosis not present

## 2016-12-08 DIAGNOSIS — J449 Chronic obstructive pulmonary disease, unspecified: Secondary | ICD-10-CM

## 2016-12-08 DIAGNOSIS — R69 Illness, unspecified: Secondary | ICD-10-CM | POA: Diagnosis not present

## 2016-12-08 DIAGNOSIS — R058 Other specified cough: Secondary | ICD-10-CM

## 2016-12-08 DIAGNOSIS — R911 Solitary pulmonary nodule: Secondary | ICD-10-CM

## 2016-12-08 DIAGNOSIS — C3492 Malignant neoplasm of unspecified part of left bronchus or lung: Secondary | ICD-10-CM | POA: Insufficient documentation

## 2016-12-08 DIAGNOSIS — F17210 Nicotine dependence, cigarettes, uncomplicated: Secondary | ICD-10-CM | POA: Diagnosis not present

## 2016-12-08 NOTE — Assessment & Plan Note (Signed)
Clinically has mild /mod copd with minimal symptoms and cough that may be CB or alternatively related to acei which would explain lack of seasonal variation or excess/ purulent sputum or mucus plugs    Will need return for full pfts in 3 months and in meantime try stopping smoking (see separate a/p)   If cough worse in interim will need trial off acei as a first step in the w/u rather than adding on more pulmonary meds

## 2016-12-08 NOTE — Patient Instructions (Addendum)
The key is to stop smoking completely before smoking completely stops you!   If cough worsens will need trial off lisinopril before considering adding  lung medications but I will defer this to your cardiologist    Please schedule a follow up visit in 3 months but call sooner if needed with PFTs  And CXR on return  Add:  Will schedule ct chest p return eval
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
## 2016-12-08 NOTE — Assessment & Plan Note (Addendum)
CT results reviewed with pt >>> Too small for PET or bx, not suspicious enough for excisional bx > really only option for now is follow the Fleischner society guidelines as rec by radiology:  As hasn't had a full Chest ct and will need f/u on the RUL lesion rec full ct when returns for f/u in 3 m  Discussed in detail all the  indications, usual  risks and alternatives  relative to the benefits with patient who agrees to proceed with conservative f/u as outlined   And work on reducing risk by stopping smoking in meantime  Total time devoted to counseling  > 50 % of initial 60 min office visit:  review case with pt/ discussion of options/alternatives/ personally creating written customized instructions  in presence of pt  then going over those specific  Instructions directly with the pt including how to use all of the meds but in particular covering each new medication in detail and the difference between the maintenance= "automatic" meds and the prns using an action plan format for the latter (If this problem/symptom => do that organization reading Left to right).  Please see AVS from this visit for a full list of these instructions which I personally wrote for this pt and  are unique to this visit.

## 2016-12-08 NOTE — Assessment & Plan Note (Signed)
Intolerant chantix p sev days = nervous  > 3 min Discussed the risks and costs (both direct and indirect)  of smoking relative to the benefits of quitting but patient unwilling to commit at this point to a specific quit date.    Although I don't endorse regular use of e cigs/ many pts find them helpful; however, I emphasized they should be considered a "one-way bridge" off all tobacco products.

## 2016-12-08 NOTE — Assessment & Plan Note (Signed)
CT neck 11/23/16 Chronic left ethmoid and left sphenoid sinusitis   Upper airway cough syndrome (previously labeled PNDS) , is  so named because it's frequently impossible to sort out how much is  CR/sinusitis with freq throat clearing (which can be related to primary GERD)   vs  causing  secondary (" extra esophageal")  GERD from wide swings in gastric pressure that occur with throat clearing, often  promoting self use of mint and menthol lozenges that reduce the lower esophageal sphincter tone and exacerbate the problem further in a cyclical fashion.   These are the same pts (now being labeled as having "irritable larynx syndrome" by some cough centers) who not infrequently have a history of having failed to tolerate ace inhibitors,  dry powder inhalers or biphosphonates or report having atypical/extraesophageal reflux symptoms that don't respond to standard doses of PPI  and are easily confused as having aecopd or asthma flares by even experienced allergists/ pulmonologists (myself included).   Would stop acei prior to additional w/u for sinus dz which may or not be related to cough but at present is such a minor concern I did not rec stop acei for now

## 2017-01-14 DIAGNOSIS — M170 Bilateral primary osteoarthritis of knee: Secondary | ICD-10-CM | POA: Diagnosis not present

## 2017-01-14 DIAGNOSIS — G894 Chronic pain syndrome: Secondary | ICD-10-CM | POA: Insufficient documentation

## 2017-01-14 DIAGNOSIS — M5136 Other intervertebral disc degeneration, lumbar region: Secondary | ICD-10-CM | POA: Diagnosis not present

## 2017-01-14 DIAGNOSIS — M545 Low back pain: Secondary | ICD-10-CM | POA: Diagnosis not present

## 2017-01-14 DIAGNOSIS — M48062 Spinal stenosis, lumbar region with neurogenic claudication: Secondary | ICD-10-CM | POA: Insufficient documentation

## 2017-01-14 DIAGNOSIS — M47897 Other spondylosis, lumbosacral region: Secondary | ICD-10-CM | POA: Diagnosis not present

## 2017-01-14 DIAGNOSIS — M47816 Spondylosis without myelopathy or radiculopathy, lumbar region: Secondary | ICD-10-CM | POA: Insufficient documentation

## 2017-01-14 DIAGNOSIS — M11261 Other chondrocalcinosis, right knee: Secondary | ICD-10-CM | POA: Diagnosis not present

## 2017-01-14 DIAGNOSIS — M25561 Pain in right knee: Secondary | ICD-10-CM | POA: Diagnosis not present

## 2017-01-14 DIAGNOSIS — Z79891 Long term (current) use of opiate analgesic: Secondary | ICD-10-CM | POA: Diagnosis not present

## 2017-01-14 DIAGNOSIS — M8588 Other specified disorders of bone density and structure, other site: Secondary | ICD-10-CM | POA: Diagnosis not present

## 2017-01-14 DIAGNOSIS — M25461 Effusion, right knee: Secondary | ICD-10-CM | POA: Diagnosis not present

## 2017-01-14 DIAGNOSIS — G8929 Other chronic pain: Secondary | ICD-10-CM | POA: Diagnosis not present

## 2017-01-14 DIAGNOSIS — M47896 Other spondylosis, lumbar region: Secondary | ICD-10-CM | POA: Diagnosis not present

## 2017-01-14 DIAGNOSIS — M1711 Unilateral primary osteoarthritis, right knee: Secondary | ICD-10-CM | POA: Diagnosis not present

## 2017-01-14 DIAGNOSIS — M25551 Pain in right hip: Secondary | ICD-10-CM | POA: Diagnosis not present

## 2017-01-14 DIAGNOSIS — M1611 Unilateral primary osteoarthritis, right hip: Secondary | ICD-10-CM | POA: Diagnosis not present

## 2017-01-28 DIAGNOSIS — M170 Bilateral primary osteoarthritis of knee: Secondary | ICD-10-CM | POA: Diagnosis not present

## 2017-01-28 DIAGNOSIS — G894 Chronic pain syndrome: Secondary | ICD-10-CM | POA: Diagnosis not present

## 2017-01-28 DIAGNOSIS — M5136 Other intervertebral disc degeneration, lumbar region: Secondary | ICD-10-CM | POA: Diagnosis not present

## 2017-01-28 DIAGNOSIS — Z79891 Long term (current) use of opiate analgesic: Secondary | ICD-10-CM | POA: Diagnosis not present

## 2017-01-28 DIAGNOSIS — M47816 Spondylosis without myelopathy or radiculopathy, lumbar region: Secondary | ICD-10-CM | POA: Diagnosis not present

## 2017-01-28 DIAGNOSIS — M7061 Trochanteric bursitis, right hip: Secondary | ICD-10-CM | POA: Diagnosis not present

## 2017-02-21 ENCOUNTER — Other Ambulatory Visit: Payer: Self-pay | Admitting: Cardiology

## 2017-03-02 ENCOUNTER — Other Ambulatory Visit: Payer: Self-pay | Admitting: Cardiology

## 2017-03-11 ENCOUNTER — Ambulatory Visit: Payer: Medicare HMO | Admitting: Internal Medicine

## 2017-03-11 DIAGNOSIS — M5136 Other intervertebral disc degeneration, lumbar region: Secondary | ICD-10-CM | POA: Diagnosis not present

## 2017-03-11 DIAGNOSIS — G894 Chronic pain syndrome: Secondary | ICD-10-CM | POA: Diagnosis not present

## 2017-03-11 DIAGNOSIS — Z79891 Long term (current) use of opiate analgesic: Secondary | ICD-10-CM | POA: Diagnosis not present

## 2017-03-11 DIAGNOSIS — M170 Bilateral primary osteoarthritis of knee: Secondary | ICD-10-CM | POA: Diagnosis not present

## 2017-03-11 DIAGNOSIS — M47816 Spondylosis without myelopathy or radiculopathy, lumbar region: Secondary | ICD-10-CM | POA: Diagnosis not present

## 2017-03-17 ENCOUNTER — Ambulatory Visit: Payer: Medicare HMO | Admitting: Internal Medicine

## 2017-03-23 ENCOUNTER — Encounter: Payer: Self-pay | Admitting: Internal Medicine

## 2017-03-23 ENCOUNTER — Ambulatory Visit (INDEPENDENT_AMBULATORY_CARE_PROVIDER_SITE_OTHER): Payer: Medicare HMO | Admitting: Internal Medicine

## 2017-03-23 VITALS — BP 140/80 | HR 61 | Ht 70.0 in | Wt 147.2 lb

## 2017-03-23 DIAGNOSIS — R05 Cough: Secondary | ICD-10-CM | POA: Diagnosis not present

## 2017-03-23 DIAGNOSIS — Z23 Encounter for immunization: Secondary | ICD-10-CM | POA: Diagnosis not present

## 2017-03-23 DIAGNOSIS — R911 Solitary pulmonary nodule: Secondary | ICD-10-CM

## 2017-03-23 DIAGNOSIS — J449 Chronic obstructive pulmonary disease, unspecified: Secondary | ICD-10-CM | POA: Diagnosis not present

## 2017-03-23 DIAGNOSIS — F17210 Nicotine dependence, cigarettes, uncomplicated: Secondary | ICD-10-CM

## 2017-03-23 DIAGNOSIS — R058 Other specified cough: Secondary | ICD-10-CM

## 2017-03-23 DIAGNOSIS — R69 Illness, unspecified: Secondary | ICD-10-CM | POA: Diagnosis not present
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
## 2017-03-23 NOTE — Patient Instructions (Signed)
Please schedule a follow up visit in 2 months but call sooner if needed with CT chest without contrast in am and PFT's with me in pm to complete the work up for pulmonary nodule and copd

## 2017-03-23 NOTE — Assessment & Plan Note (Addendum)
Spirometry 03/23/2017  FEV1 1.68 (58%)  Ratio 60 with mild curvature s rx prior    Even with "cold in chest " ( which could well be ACEi effect, not uri) he has min symptoms or need for saba so main issue at this point is stopping smoking, not escalating pulmonary meds (see separate a/p)   I had an extended discussion with the patient reviewing all relevant studies completed to date and  lasting 15 to 20 minutes of a 25 minute visit    Each maintenance medication was reviewed in detail including most importantly the difference between maintenance and prns and under what circumstances the prns are to be triggered using an action plan format that is not reflected in the computer generated alphabetically organized AVS.    Please see AVS for specific instructions unique to this visit that I personally wrote and verbalized to the the pt in detail and then reviewed with pt  by my nurse highlighting any  changes in therapy recommended at today's visit to their plan of care.

## 2017-03-25 ENCOUNTER — Encounter: Payer: Self-pay | Admitting: Internal Medicine

## 2017-03-25 NOTE — Assessment & Plan Note (Signed)
CT neck 11/23/16 Chronic left ethmoid and left sphenoid sinusitis   Still on acei, again rec trial off if cough "due to cold" does not resolve to his satisfaction   ACE inhibitors are problematic in  pts with airway complaints because  even experienced pulmonologists can't always distinguish ace effects from copd/asthma.  By themselves they don't actually cause a problem, much like oxygen can't by itself start a fire, but they certainly serve as a powerful catalyst or enhancer for any "fire"  or inflammatory process in the upper airway, be it caused by an ET  tube or more commonly reflux (especially in the obese or pts with known GERD or who are on biphoshonates).    In the era of ARB near equivalency until we have a better handle on the reversibility of the airway problem, it just makes sense to avoid ACEI  entirely in the short run and then decide later, having established a level of airway control using a reasonable limited regimen, whether to add back ace but even then being very careful to observe the pt for worsening airway control and number of meds used/ needed to control symptoms.

## 2017-03-25 NOTE — Assessment & Plan Note (Addendum)
>   3 min discussion I reviewed the Fletcher curve with the patient that basically indicates  if you quit smoking when your best day FEV1 is still preserved (as is relatively still  the case here)  it is highly unlikely you will progress to severe disease and informed the patient there was  no medication on the market that has proven to alter the curve/ its downward trajectory  or the likelihood of progression of their disease(unlike other chronic medical conditions such as atheroclerosis where we do think we can change the natural hx with risk reducing meds)    Therefore stopping smoking and maintaining abstinence are  the most important aspects of care, not choice of inhalers or for that matter, doctors.   Treatment other than smoking cessation  is entirely directed by severity of symptoms and focused also on reducing exacerbations, not attempting to change the natural history of the disease.

## 2017-03-25 NOTE — Assessment & Plan Note (Signed)
See CT neck 11/13/16 x 4 mm RUL   Active smoker so he is at risk  Discussed in detail all the  indications, usual  risks and alternatives  relative to the benefits with patient who agrees to proceed with w/u as outlined with full CT chest> delay until  p the holidays at his request.

## 2017-04-05 DIAGNOSIS — R109 Unspecified abdominal pain: Secondary | ICD-10-CM | POA: Diagnosis not present

## 2017-04-05 DIAGNOSIS — N451 Epididymitis: Secondary | ICD-10-CM | POA: Diagnosis not present

## 2017-04-13 ENCOUNTER — Other Ambulatory Visit: Payer: Self-pay | Admitting: Cardiology

## 2017-04-15 ENCOUNTER — Ambulatory Visit: Payer: Medicare HMO | Admitting: Cardiology

## 2017-04-18 DIAGNOSIS — N452 Orchitis: Secondary | ICD-10-CM | POA: Diagnosis not present

## 2017-04-18 DIAGNOSIS — E785 Hyperlipidemia, unspecified: Secondary | ICD-10-CM | POA: Diagnosis not present

## 2017-04-18 DIAGNOSIS — K210 Gastro-esophageal reflux disease with esophagitis: Secondary | ICD-10-CM | POA: Diagnosis not present

## 2017-04-18 DIAGNOSIS — I2510 Atherosclerotic heart disease of native coronary artery without angina pectoris: Secondary | ICD-10-CM | POA: Diagnosis not present

## 2017-04-18 DIAGNOSIS — J449 Chronic obstructive pulmonary disease, unspecified: Secondary | ICD-10-CM | POA: Diagnosis not present

## 2017-04-19 DIAGNOSIS — I2510 Atherosclerotic heart disease of native coronary artery without angina pectoris: Secondary | ICD-10-CM | POA: Diagnosis not present

## 2017-04-19 DIAGNOSIS — J449 Chronic obstructive pulmonary disease, unspecified: Secondary | ICD-10-CM | POA: Diagnosis not present

## 2017-04-19 DIAGNOSIS — E785 Hyperlipidemia, unspecified: Secondary | ICD-10-CM | POA: Diagnosis not present

## 2017-04-19 DIAGNOSIS — Z0000 Encounter for general adult medical examination without abnormal findings: Secondary | ICD-10-CM | POA: Diagnosis not present

## 2017-04-22 ENCOUNTER — Encounter: Payer: Self-pay | Admitting: Internal Medicine

## 2017-04-29 DIAGNOSIS — G894 Chronic pain syndrome: Secondary | ICD-10-CM | POA: Diagnosis not present

## 2017-04-29 DIAGNOSIS — M5136 Other intervertebral disc degeneration, lumbar region: Secondary | ICD-10-CM | POA: Diagnosis not present

## 2017-04-29 DIAGNOSIS — M170 Bilateral primary osteoarthritis of knee: Secondary | ICD-10-CM | POA: Diagnosis not present

## 2017-04-29 DIAGNOSIS — M48062 Spinal stenosis, lumbar region with neurogenic claudication: Secondary | ICD-10-CM | POA: Diagnosis not present

## 2017-04-29 DIAGNOSIS — M47816 Spondylosis without myelopathy or radiculopathy, lumbar region: Secondary | ICD-10-CM | POA: Diagnosis not present

## 2017-05-05 ENCOUNTER — Ambulatory Visit: Payer: Medicare HMO | Admitting: Cardiology

## 2017-05-14 ENCOUNTER — Other Ambulatory Visit: Payer: Self-pay | Admitting: Cardiology

## 2017-05-23 ENCOUNTER — Other Ambulatory Visit: Payer: Medicare HMO

## 2017-05-23 ENCOUNTER — Ambulatory Visit: Payer: Medicare HMO | Admitting: Internal Medicine

## 2017-06-10 NOTE — Progress Notes (Deleted)
Cardiology Office Note  Date: 06/10/2017   ID: [name] Graham, DOB 1936/03/01, MRN [id number]  PCP: Patient, No Pcp Per  Primary Cardiologist: Rozann Lesches, MD   No chief complaint on file.   History of Present Illness: [name] Graham is a 82 y.o. male last seen in June 2018.  Past Medical History:  Diagnosis Date  . COPD (chronic obstructive pulmonary disease) ([place])   . Coronary atherosclerosis of native coronary artery    BMS circ and PTCA PDA 2007, DES RCA 10/09, DES LAD 2/12  . Essential hypertension, benign   . GERD (gastroesophageal reflux disease)   . Hyperlipidemia   . NSTEMI (non-ST elevated myocardial infarction) ([place])    2007    Past Surgical History:  Procedure Laterality Date  . LIPOMA EXCISION Right     Current Outpatient Medications  Medication Sig Dispense Refill  . albuterol (PROVENTIL HFA;VENTOLIN HFA) 108 (90 Base) MCG/ACT inhaler Inhale 1-2 puffs into the lungs every 6 (six) hours as needed for wheezing or shortness of breath.    Marland Kitchen aspirin 325 MG tablet Take 325 mg by mouth 2 (two) times daily.     . clopidogrel (PLAVIX) 75 MG tablet Take 75 mg by mouth daily.    . clopidogrel (PLAVIX) 75 MG tablet TAKE ONE TABLET BY MOUTH ONCE DAILY 90 tablet 3  . HYDROcodone-acetaminophen (NORCO/VICODIN) 5-325 MG tablet Take one tab po q 4 hrs prn pain 15 tablet 0  . lisinopril (PRINIVIL,ZESTRIL) 40 MG tablet Take 40 mg by mouth daily.    Marland Kitchen lisinopril (PRINIVIL,ZESTRIL) 40 MG tablet TAKE 1 TABLET BY MOUTH ONCE DAILY IN THE MORNING 90 tablet 1  . metoprolol succinate (TOPROL-XL) 25 MG 24 hr tablet TAKE ONE-HALF TABLET BY MOUTH ONCE DAILY 45 tablet 3  . nitroGLYCERIN (NITROSTAT) 0.4 MG SL tablet Place 1 tablet (0.4 mg total) under the tongue every 5 (five) minutes as needed for chest pain. 25 tablet 3  . pantoprazole (PROTONIX) 40 MG tablet Take 40 mg by mouth daily.    . simvastatin (ZOCOR) 80 MG tablet TAKE ONE TABLET BY MOUTH AT BEDTIME 90 tablet 1   No  current facility-administered medications for this visit.    Allergies:  Patient has no known allergies.   Social History: The patient  reports that he has been smoking cigarettes.  He started smoking about 64 years ago. He has a 63.00 pack-year smoking history. he has never used smokeless tobacco. He reports that he does not drink alcohol or use drugs.   Family History: The patient's family history includes Cancer in his father; Heart attack in his mother.   ROS:  Please see the history of present illness. Otherwise, complete review of systems is positive for {NONE DEFAULTED:18576::"none"}.  All other systems are reviewed and negative.   Physical Exam: VS:  There were no vitals taken for this visit., BMI There is no height or weight on file to calculate BMI.  Wt Readings from Last 3 Encounters:  03/23/17 147 lb 3.2 oz (66.8 kg)  12/08/16 143 lb 6.4 oz (65 kg)  11/23/16 150 lb (68 kg)    General: Patient appears comfortable at rest. HEENT: Conjunctiva and lids normal, oropharynx clear with moist mucosa. Neck: Supple, no elevated JVP or carotid bruits, no thyromegaly. Lungs: Clear to auscultation, nonlabored breathing at rest. Cardiac: Regular rate and rhythm, no S3 or significant systolic murmur, no pericardial rub. Abdomen: Soft, nontender, no hepatomegaly, bowel sounds present, no guarding or rebound. Extremities: No  pitting edema, distal pulses 2+. Skin: Warm and dry. Musculoskeletal: No kyphosis. Neuropsychiatric: Alert and oriented x3, affect grossly appropriate.  ECG: I personally reviewed the tracing from 04/10/2016 which showed sinus rhythm with left anterior fascicular block and increased voltage.  Recent Labwork:  04/10/2016: ALT 12; AST 16; BUN 14; Creatinine, Ser 0.88; Hemoglobin 16.0; Platelets 181; Potassium 3.7; Sodium 137; TSH 4.890   Other Studies Reviewed Today:  Lexiscan Cardiolite 05/17/2014: FINDINGS: Baseline tracing shows normal sinus rhythm. Lexiscan bolus  was given in standard fashion. Heart rate increased from 52 beats per min up to 83 beats per min, and blood pressure increased from 133/78 up to 175/80. No chest pain was reported. There were no diagnostic ST segment changes, occasional PACs were noted.  Analysis of the raw perfusion data finds adequate myocardial radiotracer uptake with diaphragmatic attenuation.  Perfusion: There is a moderate-sized, moderate intensity, fixed inferior defect from apex to base. There is also a very small, mild intensity, apical anterior defect that is partially reversible.  Wall Motion: Normal left ventricular wall motion. No left ventricular dilation.  Left Ventricular Ejection Fraction: 52 %  End diastolic volume 026 ml  End systolic volume 58 ml  IMPRESSION: 1. Evidence of diaphragmatic attenuation with fixed inferior defect as outlined, also a small region of apical ischemia noted. There are no large ischemic territories.  2. Normal left ventricular wall motion.  3. Left ventricular ejection fraction 52%  4. Low-risk stress test findings*.  Echocardiogram 04/11/2016: Study Conclusions  - Left ventricle: The cavity size was normal. Wall thickness was increased in a pattern of moderate LVH. Systolic function was normal. The estimated ejection fraction was in the range of 50% to 55%. [place] function is abnormal, indeterminate grade. Wall motion was normal; there were no regional wall motion abnormalities. - Aortic valve: There was trivial regurgitation. Valve area (VTI): 2.83 cm^2. Valve area (Vmax): 3.32 cm^2. Valve area (Vmean): 2.67 cm^2. - Mitral valve: There was mild regurgitation. - Left atrium: The atrium was mildly dilated. - Atrial septum: No defect or patent foramen ovale was identified. - Techncally adequate study.  Assessment and Plan:    Current medicines were reviewed with the patient today.  No orders of the defined types were  placed in this encounter.   Disposition:  Signed, Satira Sark, MD, [facility] 06/10/2017 11:51 AM    [facility], [place], [place] [address] Phone: [phone]; Fax: [phone]

## 2017-06-13 ENCOUNTER — Ambulatory Visit: Payer: Medicare HMO | Admitting: Cardiology

## 2017-06-17 ENCOUNTER — Ambulatory Visit: Payer: Medicare HMO | Admitting: Internal Medicine

## 2017-06-17 ENCOUNTER — Inpatient Hospital Stay: Admission: RE | Admit: 2017-06-17 | Payer: Medicare HMO | Source: Ambulatory Visit

## 2017-06-20 ENCOUNTER — Ambulatory Visit: Payer: Medicare HMO | Admitting: Nurse Practitioner

## 2017-07-04 NOTE — Progress Notes (Signed)
Cardiology Office Note  Date: 07/05/2017   ID: MATTHEO SWINDLE, DOB 1935-09-03, MRN [id number]  PCP: Lysbeth Penner, FNP  Primary Cardiologist: Rozann Lesches, MD   Chief Complaint  Patient presents with  . Coronary Artery Disease    History of Present Illness: OSMANY AZER is a 82 y.o. male last seen in June 2018.  He presents for a routine follow-up visit.  Reports no angina symptoms or nitroglycerin use since last encounter.  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  This is his slow time of the year.  He has had some recent rhinorrhea and congestion, no fevers or chills.  No increasing wheezing.  He has pending follow-up with Dr. Melvyn Novas, to have PFTs and follow-up chest CT.  I reviewed his medications.  Cardiac regimen includes aspirin, Plavix, lisinopril, Toprol-XL, and Zocor.  We are requesting his most recent lipid panel from PCP.  I personally reviewed his ECG today which shows sinus rhythm with left anterior fascicular block and PVCs including a couplet.  Has had no sudden dizziness or syncope.  Last ischemic workup was in 2016, overall low risk.  Past Medical History:  Diagnosis Date  . COPD (chronic obstructive pulmonary disease) ([place])   . Coronary atherosclerosis of native coronary artery    BMS circ and PTCA PDA 2007, DES RCA 10/09, DES LAD 2/12  . Essential hypertension, benign   . GERD (gastroesophageal reflux disease)   . Hyperlipidemia   . NSTEMI (non-ST elevated myocardial infarction) ([place])    2007    Past Surgical History:  Procedure Laterality Date  . LIPOMA EXCISION Right     Current Outpatient Medications  Medication Sig Dispense Refill  . albuterol (PROVENTIL HFA;VENTOLIN HFA) 108 (90 Base) MCG/ACT inhaler Inhale 1-2 puffs into the lungs every 6 (six) hours as needed for wheezing or shortness of breath.    Marland Kitchen aspirin 325 MG tablet Take 325 mg by mouth 2 (two) times daily.     . clopidogrel (PLAVIX) 75 MG tablet Take 75  mg by mouth daily.    Marland Kitchen HYDROcodone-acetaminophen (NORCO/VICODIN) 5-325 MG tablet Take one tab po q 4 hrs prn pain 15 tablet 0  . lisinopril (PRINIVIL,ZESTRIL) 40 MG tablet TAKE 1 TABLET BY MOUTH ONCE DAILY IN THE MORNING 90 tablet 1  . metoprolol succinate (TOPROL-XL) 25 MG 24 hr tablet TAKE ONE-HALF TABLET BY MOUTH ONCE DAILY 45 tablet 3  . nitroGLYCERIN (NITROSTAT) 0.4 MG SL tablet Place 1 tablet (0.4 mg total) under the tongue every 5 (five) minutes as needed for chest pain. 25 tablet 3  . simvastatin (ZOCOR) 80 MG tablet TAKE ONE TABLET BY MOUTH AT BEDTIME 90 tablet 1   No current facility-administered medications for this visit.    Allergies:  Patient has no known allergies.   Social History: The patient  reports that he has been smoking cigarettes.  He started smoking about 64 years ago. He has a 63.00 pack-year smoking history. he has never used smokeless tobacco. He reports that he does not drink alcohol or use drugs.   ROS:  Please see the history of present illness. Otherwise, complete review of systems is positive for hearing loss, phlegm production.  All other systems are reviewed and negative.   Physical Exam: VS:  BP (!) 152/78   Pulse 66   Ht 5\' 10"  (1.778 m)   Wt 148 lb (67.1 kg)   SpO2 94%   BMI 21.24 kg/m , BMI  Body mass index is 21.24 kg/m.  Wt Readings from Last 3 Encounters:  07/05/17 148 lb (67.1 kg)  03/23/17 147 lb 3.2 oz (66.8 kg)  12/08/16 143 lb 6.4 oz (65 kg)    General: Elderly male, appears comfortable at rest. HEENT: Conjunctiva and lids normal, oropharynx clear. Neck: Supple, no elevated JVP or carotid bruits, no thyromegaly. Lungs: No wheezing, nonlabored breathing at rest. Cardiac: Regular rate and rhythm, no S3 or significant systolic murmur, no pericardial rub. Abdomen: Soft, nontender, bowel sounds present. Extremities: No pitting edema, distal pulses 2+. Skin: Warm and dry. Musculoskeletal: No kyphosis. Neuropsychiatric: Alert and oriented  x3, affect grossly appropriate.  ECG: I personally reviewed the tracing from 04/10/2016 which showed sinus rhythm with IVCD.  Recent Labwork: No results found for requested labs within last 8760 hours.     Component Value Date/Time   CHOL 138 05/09/2014 0734   CHOL 144 06/15/2013 0931   TRIG 92 05/09/2014 0734   HDL 55 05/09/2014 0734   HDL 61 06/15/2013 0931   CHOLHDL 2.5 05/09/2014 0734   VLDL 18 05/09/2014 0734   LDLCALC 65 05/09/2014 0734   LDLCALC 69 06/15/2013 0931    Other Studies Reviewed Today:  Echocardiogram 04/11/2016: Study Conclusions  - Left ventricle: The cavity size was normal. Wall thickness was   increased in a pattern of moderate LVH. Systolic function was   normal. The estimated ejection fraction was in the range of 50%   to 55%. [place] function is abnormal, indeterminate grade. Wall   motion was normal; there were no regional wall motion   abnormalities. - Aortic valve: There was trivial regurgitation. Valve area (VTI):   2.83 cm^2. Valve area (Vmax): 3.32 cm^2. Valve area (Vmean): 2.67   cm^2. - Mitral valve: There was mild regurgitation. - Left atrium: The atrium was mildly dilated. - Atrial septum: No defect or patent foramen ovale was identified. - Techncally adequate study.  Lexiscan Myoview 05/17/2014: IMPRESSION: 1. Evidence of diaphragmatic attenuation with fixed inferior defect as outlined, also a small region of apical ischemia noted. There are no large ischemic territories.  2. Normal left ventricular wall motion.  3. Left ventricular ejection fraction 52%  4. Low-risk stress test findings*.  Assessment and Plan:  1.  Symptomatically stable CAD with history of multivessel distribution PCI, last in 2012.  Follow-up Myoview in 2016 was low risk.  Plan is to continue medical therapy and observation for now.  ECG reviewed.  2.  Essential hypertension, blood pressure elevated today.  He reports compliance with his medications  including lisinopril and Toprol-XL.  Continue to track this with PCP.  3.  Tobacco abuse.  Smoking cessation has been discussed but he has not been motivated to quit.  4.  Mixed hyperlipidemia, on Zocor.  Requesting most recent lipid panel from PCP.  Current medicines were reviewed with the patient today.   Orders Placed This Encounter  Procedures  . EKG 12-Lead    Disposition: Follow-up in 6 months.  Signed, Satira Sark, MD, [facility] 07/05/2017 3:52 PM    [facility] [address], [place], [place] [address] Phone: [phone]; Fax: [phone]

## 2017-07-05 ENCOUNTER — Ambulatory Visit (INDEPENDENT_AMBULATORY_CARE_PROVIDER_SITE_OTHER): Payer: Medicare HMO | Admitting: Cardiology

## 2017-07-05 ENCOUNTER — Encounter: Payer: Self-pay | Admitting: Cardiology

## 2017-07-05 VITALS — BP 152/78 | HR 66 | Ht 70.0 in | Wt 148.0 lb

## 2017-07-05 DIAGNOSIS — I10 Essential (primary) hypertension: Secondary | ICD-10-CM | POA: Diagnosis not present

## 2017-07-05 DIAGNOSIS — E782 Mixed hyperlipidemia: Secondary | ICD-10-CM

## 2017-07-05 DIAGNOSIS — F17200 Nicotine dependence, unspecified, uncomplicated: Secondary | ICD-10-CM | POA: Diagnosis not present

## 2017-07-05 DIAGNOSIS — I25119 Atherosclerotic heart disease of native coronary artery with unspecified angina pectoris: Secondary | ICD-10-CM | POA: Diagnosis not present

## 2017-07-05 DIAGNOSIS — R69 Illness, unspecified: Secondary | ICD-10-CM | POA: Diagnosis not present

## 2017-07-05 NOTE — Patient Instructions (Signed)

## 2017-07-10 DIAGNOSIS — M545 Low back pain: Secondary | ICD-10-CM | POA: Diagnosis not present

## 2017-07-21 DIAGNOSIS — G894 Chronic pain syndrome: Secondary | ICD-10-CM | POA: Diagnosis not present

## 2017-07-21 DIAGNOSIS — Z79899 Other long term (current) drug therapy: Secondary | ICD-10-CM | POA: Diagnosis not present

## 2017-07-21 DIAGNOSIS — M47816 Spondylosis without myelopathy or radiculopathy, lumbar region: Secondary | ICD-10-CM | POA: Diagnosis not present

## 2017-07-21 DIAGNOSIS — M7918 Myalgia, other site: Secondary | ICD-10-CM | POA: Diagnosis not present

## 2017-07-26 ENCOUNTER — Encounter: Payer: Self-pay | Admitting: Internal Medicine

## 2017-07-26 ENCOUNTER — Ambulatory Visit (INDEPENDENT_AMBULATORY_CARE_PROVIDER_SITE_OTHER): Payer: Medicare HMO | Admitting: Internal Medicine

## 2017-07-26 ENCOUNTER — Ambulatory Visit (INDEPENDENT_AMBULATORY_CARE_PROVIDER_SITE_OTHER)
Admission: RE | Admit: 2017-07-26 | Discharge: 2017-07-26 | Disposition: A | Discharge status: Home / Self Care | Payer: Medicare HMO | Source: Ambulatory Visit | Attending: Internal Medicine | Admitting: Internal Medicine

## 2017-07-26 VITALS — BP 120/60 | HR 65 | Ht 68.5 in | Wt 144.0 lb

## 2017-07-26 DIAGNOSIS — J449 Chronic obstructive pulmonary disease, unspecified: Secondary | ICD-10-CM

## 2017-07-26 DIAGNOSIS — R911 Solitary pulmonary nodule: Secondary | ICD-10-CM | POA: Diagnosis not present

## 2017-07-26 DIAGNOSIS — R69 Illness, unspecified: Secondary | ICD-10-CM | POA: Diagnosis not present

## 2017-07-26 DIAGNOSIS — F17210 Nicotine dependence, cigarettes, uncomplicated: Secondary | ICD-10-CM | POA: Diagnosis not present

## 2017-07-26 LAB — PULMONARY FUNCTION TEST
DL/VA % pred: 59 %
DL/VA: 2.69 ml/min/mmHg/L
DLCO UNC % PRED: 50 %
DLCO unc: 15.16 ml/min/mmHg
FEF 25-75 PRE: 0.83 L/s
FEF 25-75 Post: 1.21 L/sec
FEF2575-%CHANGE-POST: 45 %
FEF2575-%PRED-POST: 65 %
FEF2575-%Pred-Pre: 44 %
FEV1-%CHANGE-POST: 13 %
FEV1-%Pred-Post: 73 %
FEV1-%Pred-Pre: 64 %
FEV1-POST: 1.97 L
FEV1-PRE: 1.74 L
FEV1FVC-%CHANGE-POST: 2 %
FEV1FVC-%Pred-Pre: 84 %
FEV6-%CHANGE-POST: 10 %
FEV6-%PRED-PRE: 79 %
FEV6-%Pred-Post: 87 %
FEV6-PRE: 2.81 L
FEV6-Post: 3.11 L
FEV6FVC-%Change-Post: 0 %
FEV6FVC-%PRED-PRE: 104 %
FEV6FVC-%Pred-Post: 104 %
FVC-%CHANGE-POST: 10 %
FVC-%PRED-POST: 84 %
FVC-%PRED-PRE: 75 %
FVC-POST: 3.19 L
FVC-PRE: 2.88 L
POST FEV1/FVC RATIO: 62 %
PRE FEV6/FVC RATIO: 97 %
Post FEV6/FVC ratio: 97 %
Pre FEV1/FVC ratio: 61 %
RV % PRED: 134 %
RV: 3.45 L
TLC % pred: 96 %
TLC: 6.5 L

## 2017-07-26 NOTE — Progress Notes (Signed)
PFT done today. 
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
## 2017-07-26 NOTE — Patient Instructions (Signed)
The key is to stop smoking completely before smoking completely stops you- it's not too late  Pulmonary follow up is as needed

## 2017-08-01 ENCOUNTER — Encounter: Payer: Self-pay | Admitting: Internal Medicine

## 2017-08-01 NOTE — Assessment & Plan Note (Signed)
See CT neck 11/13/16 x 4 mm RUL  - Repeat 07/26/2017   No change RUL = perifissural nodule on LN > rec repeat in 12 m > placed in reminder file as he is moderate risk from smoking   Discussed in detail all the  indications, usual  risks and alternatives  relative to the benefits with patient who agrees to proceed with conservative f/u as outlined

## 2017-08-01 NOTE — Assessment & Plan Note (Signed)
Spirometry 03/23/2017  FEV1 1.68 (58%)  Ratio 60 with mild curvature s rx prior   - PFT's  07/26/2017  FEV1 1.97 (73 % ) ratio 62  p 13 % improvement from saba p nothing prior to study with DLCO  50 % corrects to 59 % for alv volume    I reviewed the Fletcher curve with the patient that basically indicates  if you quit smoking when your best day FEV1 is still well preserved (as is clearly  the case here)  it is highly unlikely you will progress to severe disease and informed the patient there was  no medication on the market that has proven to alter the curve/ its downward trajectory  or the likelihood of progression of their disease(unlike other chronic medical conditions such as atheroclerosis where we do think we can change the natural hx with risk reducing meds)    Therefore stopping smoking and maintaining abstinence are  the most important aspects of care, not choice of inhalers or for that matter, doctors.   Treatment other than smoking cessation  is entirely directed by severity of symptoms and focused also on reducing exacerbations, not attempting to change the natural history of the disease.    Since minimally limited, no tendency to aecopd ok to wait until more symptoms but needs to work hard on smoking cessations (see separate a/p)

## 2017-08-01 NOTE — Assessment & Plan Note (Signed)
-   intol to chantix  > 3 min Discussed the risks and costs (both direct and indirect)  of smoking relative to the benefits of quitting but patient unwilling to commit at this point to a specific quit date.

## 2017-08-03 DIAGNOSIS — K219 Gastro-esophageal reflux disease without esophagitis: Secondary | ICD-10-CM | POA: Diagnosis not present

## 2017-08-03 DIAGNOSIS — R69 Illness, unspecified: Secondary | ICD-10-CM | POA: Diagnosis not present

## 2017-08-03 DIAGNOSIS — J449 Chronic obstructive pulmonary disease, unspecified: Secondary | ICD-10-CM | POA: Diagnosis not present

## 2017-08-03 DIAGNOSIS — I119 Hypertensive heart disease without heart failure: Secondary | ICD-10-CM | POA: Diagnosis not present

## 2017-08-03 DIAGNOSIS — E785 Hyperlipidemia, unspecified: Secondary | ICD-10-CM | POA: Diagnosis not present

## 2017-08-03 DIAGNOSIS — I10 Essential (primary) hypertension: Secondary | ICD-10-CM | POA: Diagnosis not present

## 2017-08-03 DIAGNOSIS — Z1211 Encounter for screening for malignant neoplasm of colon: Secondary | ICD-10-CM | POA: Diagnosis not present

## 2017-08-03 DIAGNOSIS — Z79899 Other long term (current) drug therapy: Secondary | ICD-10-CM | POA: Diagnosis not present

## 2017-08-03 DIAGNOSIS — Z7982 Long term (current) use of aspirin: Secondary | ICD-10-CM | POA: Diagnosis not present

## 2017-08-03 DIAGNOSIS — Z955 Presence of coronary angioplasty implant and graft: Secondary | ICD-10-CM | POA: Diagnosis not present

## 2017-08-03 DIAGNOSIS — J439 Emphysema, unspecified: Secondary | ICD-10-CM | POA: Diagnosis not present

## 2017-08-03 DIAGNOSIS — Z7902 Long term (current) use of antithrombotics/antiplatelets: Secondary | ICD-10-CM | POA: Diagnosis not present

## 2017-08-10 ENCOUNTER — Ambulatory Visit: Payer: Medicare HMO | Admitting: Gastroenterology

## 2017-09-19 ENCOUNTER — Other Ambulatory Visit: Payer: Self-pay | Admitting: Cardiology

## 2017-10-14 ENCOUNTER — Other Ambulatory Visit: Payer: Self-pay | Admitting: Cardiology

## 2017-10-25 ENCOUNTER — Telehealth: Payer: Self-pay | Admitting: Internal Medicine

## 2017-10-25 ENCOUNTER — Encounter: Payer: Self-pay | Admitting: Internal Medicine

## 2017-10-25 ENCOUNTER — Ambulatory Visit: Payer: Medicare HMO | Admitting: Gastroenterology

## 2017-10-25 NOTE — Telephone Encounter (Signed)
PATIENT WAS A NO SHOW AND LETTER SENT  °

## 2017-11-22 DIAGNOSIS — G894 Chronic pain syndrome: Secondary | ICD-10-CM | POA: Diagnosis not present

## 2017-11-22 DIAGNOSIS — M47816 Spondylosis without myelopathy or radiculopathy, lumbar region: Secondary | ICD-10-CM | POA: Diagnosis not present

## 2017-11-22 DIAGNOSIS — M1711 Unilateral primary osteoarthritis, right knee: Secondary | ICD-10-CM | POA: Diagnosis not present

## 2018-01-04 DIAGNOSIS — G894 Chronic pain syndrome: Secondary | ICD-10-CM | POA: Diagnosis not present

## 2018-01-04 DIAGNOSIS — M47816 Spondylosis without myelopathy or radiculopathy, lumbar region: Secondary | ICD-10-CM | POA: Diagnosis not present

## 2018-01-04 DIAGNOSIS — M5136 Other intervertebral disc degeneration, lumbar region: Secondary | ICD-10-CM | POA: Diagnosis not present

## 2018-01-06 ENCOUNTER — Telehealth: Payer: Self-pay | Admitting: Internal Medicine

## 2018-01-06 NOTE — Telephone Encounter (Signed)
Message not for [place] but for cardiology.[name] Graham

## 2018-01-11 NOTE — Progress Notes (Signed)
Cardiology Office Note  Date: 01/12/2018   ID: [name] Graham, DOB 02-11-1936, MRN [id number]  PCP: Lysbeth Penner, FNP  Primary Cardiologist: Rozann Lesches, MD   Chief Complaint  Patient presents with  . Coronary Artery Disease    History of Present Illness: [name] Graham is an 82 y.o. male last seen in March.  He is here for a routine follow-up visit.  From a cardiac perspective he reports no angina symptoms or nitroglycerin use, stable NYHA class II dyspnea with most activities.  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  I reviewed his medications, we discussed reducing aspirin 81 mg daily.  He otherwise continues on Plavix, lisinopril, Toprol-XL, Zocor, and as needed nitroglycerin.  He plans to undergo an epidural steroid injection for pain control per Dr. Francesco Runner, sometime next month.  It is requested that he come off Plavix for 5 days prior to the procedure.  He continues to follow with Dr. Melvyn Novas for pulmonary assessment.  Past Medical History:  Diagnosis Date  . COPD (chronic obstructive pulmonary disease) ([place])   . Coronary atherosclerosis of native coronary artery    BMS circ and PTCA PDA 2007, DES RCA 10/09, DES LAD 2/12  . Essential hypertension, benign   . GERD (gastroesophageal reflux disease)   . Hyperlipidemia   . NSTEMI (non-ST elevated myocardial infarction) ([place])    2007    Past Surgical History:  Procedure Laterality Date  . LIPOMA EXCISION Right     Current Outpatient Medications  Medication Sig Dispense Refill  . albuterol (PROVENTIL HFA;VENTOLIN HFA) 108 (90 Base) MCG/ACT inhaler Inhale 1-2 puffs into the lungs every 6 (six) hours as needed for wheezing or shortness of breath.    . clopidogrel (PLAVIX) 75 MG tablet Take 75 mg by mouth daily.    Marland Kitchen HYDROcodone-acetaminophen (NORCO/VICODIN) 5-325 MG tablet Take one tab po q 4 hrs prn pain 15 tablet 0  . lisinopril (PRINIVIL,ZESTRIL) 40 MG tablet TAKE 1 TABLET BY MOUTH ONCE DAILY IN  THE MORNING 90 tablet 3  . metoprolol succinate (TOPROL-XL) 25 MG 24 hr tablet TAKE ONE-HALF TABLET BY MOUTH ONCE DAILY 45 tablet 3  . nitroGLYCERIN (NITROSTAT) 0.4 MG SL tablet Place 1 tablet (0.4 mg total) under the tongue every 5 (five) minutes as needed for chest pain. 25 tablet 3  . simvastatin (ZOCOR) 80 MG tablet TAKE 1 TABLET BY MOUTH ONCE DAILY AT BEDTIME 90 tablet 1  . aspirin EC 81 MG tablet Take 1 tablet (81 mg total) by mouth daily. 90 tablet 3   No current facility-administered medications for this visit.    Allergies:  Patient has no known allergies.   Social History: The patient  reports that he has been smoking cigarettes. He started smoking about 64 years ago. He has a 63.00 pack-year smoking history. He has never used smokeless tobacco. He reports that he does not drink alcohol or use drugs.   ROS:  Please see the history of present illness. Otherwise, complete review of systems is positive for hearing loss, chronic back pain.  All other systems are reviewed and negative.   Physical Exam: VS:  BP (!) 150/90   Pulse 69   Ht 5' 10.5" (1.791 m)   Wt 138 lb (62.6 kg)   SpO2 97%   BMI 19.52 kg/m , BMI Body mass index is 19.52 kg/m.  Wt Readings from Last 3 Encounters:  01/12/18 138 lb (62.6 kg)  07/26/17 144 lb (65.3 kg)  07/05/17  148 lb (67.1 kg)    General: Elderly male, appears comfortable at rest. HEENT: Conjunctiva and lids normal, oropharynx clear. Neck: Supple, no elevated JVP or carotid bruits, no thyromegaly. Lungs: Diminished breath sounds without wheezing, nonlabored breathing at rest. Cardiac: Regular rate and rhythm, no S3 or significant systolic murmur. Abdomen: Soft, nontender, bowel sounds present. Extremities: No pitting edema, distal pulses 2+. Skin: Warm and dry. Musculoskeletal: No kyphosis. Neuropsychiatric: Alert and oriented x3, affect grossly appropriate.  ECG: I personally reviewed the tracing from 07/05/2017 which showed sinus rhythm with  left anterior fascicular block and PVCs including couplet.  Recent Labwork:    Component Value Date/Time   CHOL 138 05/09/2014 0734   CHOL 144 06/15/2013 0931   TRIG 92 05/09/2014 0734   HDL 55 05/09/2014 0734   HDL 61 06/15/2013 0931   CHOLHDL 2.5 05/09/2014 0734   VLDL 18 05/09/2014 0734   LDLCALC 65 05/09/2014 0734   LDLCALC 69 06/15/2013 0931    Other Studies Reviewed Today:  Echocardiogram 04/11/2016: Study Conclusions  - Left ventricle: The cavity size was normal. Wall thickness was increased in a pattern of moderate LVH. Systolic function was normal. The estimated ejection fraction was in the range of 50% to 55%. [place] function is abnormal, indeterminate grade. Wall motion was normal; there were no regional wall motion abnormalities. - Aortic valve: There was trivial regurgitation. Valve area (VTI): 2.83 cm^2. Valve area (Vmax): 3.32 cm^2. Valve area (Vmean): 2.67 cm^2. - Mitral valve: There was mild regurgitation. - Left atrium: The atrium was mildly dilated. - Atrial septum: No defect or patent foramen ovale was identified. - Techncally adequate study.  Lexiscan Myoview 05/17/2014: IMPRESSION: 1. Evidence of diaphragmatic attenuation with fixed inferior defect as outlined, also a small region of apical ischemia noted. There are no large ischemic territories.  2. Normal left ventricular wall motion.  3. Left ventricular ejection fraction 52%  4. Low-risk stress test findings*.  Assessment and Plan:  1.  CAD with history of multivessel PCI, most recently in 2012.  He does not report any active angina symptoms and underwent follow-up ischemic testing in 2016.  Plan is to continue medical therapy, reduce aspirin to 81 mg daily.  He should be able to hold Plavix temporarily prior to epidural steroid injection per Dr. Francesco Runner.  Paperwork returned to that office.  2.  Long-standing tobacco abuse.  He has not been motivated to quit.  Smoking  cessation discussed.  3.  Essential hypertension, continues on lisinopril and Toprol-XL.  Keep follow-up with PCP.  4.  Mixed hyperlipidemia on Zocor.  No changes made today.  Current medicines were reviewed with the patient today.  Disposition: Follow-up in 6 months.  Signed, Satira Sark, MD, [facility] 01/12/2018 10:04 AM    [facility] at [place], [place],  [address] Phone: [phone]; Fax: [phone]

## 2018-01-12 ENCOUNTER — Ambulatory Visit (INDEPENDENT_AMBULATORY_CARE_PROVIDER_SITE_OTHER): Payer: Medicare HMO | Admitting: Cardiology

## 2018-01-12 ENCOUNTER — Encounter: Payer: Self-pay | Admitting: Cardiology

## 2018-01-12 ENCOUNTER — Ambulatory Visit: Payer: Medicare HMO | Admitting: Cardiology

## 2018-01-12 VITALS — BP 150/90 | HR 69 | Ht 70.5 in | Wt 138.0 lb

## 2018-01-12 DIAGNOSIS — R69 Illness, unspecified: Secondary | ICD-10-CM | POA: Diagnosis not present

## 2018-01-12 DIAGNOSIS — I25119 Atherosclerotic heart disease of native coronary artery with unspecified angina pectoris: Secondary | ICD-10-CM

## 2018-01-12 DIAGNOSIS — E782 Mixed hyperlipidemia: Secondary | ICD-10-CM | POA: Diagnosis not present

## 2018-01-12 DIAGNOSIS — F17200 Nicotine dependence, unspecified, uncomplicated: Secondary | ICD-10-CM | POA: Diagnosis not present

## 2018-01-12 DIAGNOSIS — I10 Essential (primary) hypertension: Secondary | ICD-10-CM

## 2018-01-12 MED ORDER — ASPIRIN EC 81 MG PO TBEC: 81.0000 mg | DELAYED_RELEASE_TABLET | Freq: Every day | ORAL | 3 refills | Status: DC

## 2018-01-12 NOTE — Patient Instructions (Addendum)
Medication Instructions:   Your physician has recommended you make the following change in your medication:   Decrease aspirin to 81 mg by mouth daily.  Continue all other medications the same.  Labwork:  NONE  Testing/Procedures:  NONE  Follow-Up:  Your physician recommends that you schedule a follow-up appointment in: 6 months. You will receive a reminder letter in the mail in about 4 months reminding you to call and schedule your appointment. If you don't receive this letter, please contact our office.   Any Other Special Instructions Will Be Listed Below (If Applicable).  If you need a refill on your cardiac medications before your next appointment, please call your pharmacy.

## 2018-01-26 ENCOUNTER — Ambulatory Visit: Payer: Medicare HMO | Admitting: Cardiology

## 2018-02-03 DIAGNOSIS — M48061 Spinal stenosis, lumbar region without neurogenic claudication: Secondary | ICD-10-CM | POA: Diagnosis not present

## 2018-02-03 DIAGNOSIS — M5136 Other intervertebral disc degeneration, lumbar region: Secondary | ICD-10-CM | POA: Diagnosis not present

## 2018-03-01 DIAGNOSIS — M47816 Spondylosis without myelopathy or radiculopathy, lumbar region: Secondary | ICD-10-CM | POA: Diagnosis not present

## 2018-03-01 DIAGNOSIS — G894 Chronic pain syndrome: Secondary | ICD-10-CM | POA: Diagnosis not present

## 2018-03-01 DIAGNOSIS — M1711 Unilateral primary osteoarthritis, right knee: Secondary | ICD-10-CM | POA: Diagnosis not present

## 2018-03-18 DIAGNOSIS — R69 Illness, unspecified: Secondary | ICD-10-CM | POA: Diagnosis not present

## 2018-04-10 ENCOUNTER — Other Ambulatory Visit: Payer: Self-pay | Admitting: Cardiology

## 2018-04-11 ENCOUNTER — Other Ambulatory Visit: Payer: Self-pay | Admitting: Cardiology

## 2018-04-11 NOTE — Telephone Encounter (Signed)
Spoke with [facility] pharmacy who confirmed that Toprol XL and Simvastatin were received yesterday - pt made aware and appreciative

## 2018-04-11 NOTE — Telephone Encounter (Signed)
Patient called stating that he was told by [facility] that they have not received medication refills.

## 2018-04-12 ENCOUNTER — Other Ambulatory Visit: Payer: Self-pay | Admitting: *Deleted

## 2018-04-12 ENCOUNTER — Telehealth: Payer: Self-pay | Admitting: Cardiology

## 2018-04-12 MED ORDER — CLOPIDOGREL BISULFATE 75 MG PO TABS: 75.0000 mg | ORAL_TABLET | Freq: Every day | ORAL | 2 refills | Status: DC

## 2018-04-12 MED ORDER — NITROGLYCERIN 0.4 MG SL SUBL: 0.4000 mg | SUBLINGUAL_TABLET | SUBLINGUAL | 3 refills | Status: DC | PRN

## 2018-04-12 NOTE — Telephone Encounter (Signed)
°*  STAT* If patient is at the pharmacy, call can be transferred to refill team.   1. Which medications need to be refilled?  What told they are waiting on authorization -   Patient is out of medication past three days clopidogrel (PLAVIX) 75 MG tablet    2. Which pharmacy/location (including street and city if local pharmacy) is medication to be sent to? [facility] [place] [facility]. Do they need a 30 day or 90 day supply?

## 2018-04-27 DIAGNOSIS — R197 Diarrhea, unspecified: Secondary | ICD-10-CM | POA: Diagnosis not present

## 2018-04-27 DIAGNOSIS — R339 Retention of urine, unspecified: Secondary | ICD-10-CM | POA: Diagnosis not present

## 2018-04-27 DIAGNOSIS — E860 Dehydration: Secondary | ICD-10-CM | POA: Diagnosis not present

## 2018-04-30 DIAGNOSIS — T83198A Other mechanical complication of other urinary devices and implants, initial encounter: Secondary | ICD-10-CM | POA: Diagnosis not present

## 2018-04-30 DIAGNOSIS — Z79899 Other long term (current) drug therapy: Secondary | ICD-10-CM | POA: Diagnosis not present

## 2018-04-30 DIAGNOSIS — E7800 Pure hypercholesterolemia, unspecified: Secondary | ICD-10-CM | POA: Diagnosis not present

## 2018-04-30 DIAGNOSIS — R52 Pain, unspecified: Secondary | ICD-10-CM | POA: Diagnosis not present

## 2018-04-30 DIAGNOSIS — Z7901 Long term (current) use of anticoagulants: Secondary | ICD-10-CM | POA: Diagnosis not present

## 2018-04-30 DIAGNOSIS — N3001 Acute cystitis with hematuria: Secondary | ICD-10-CM | POA: Diagnosis not present

## 2018-04-30 DIAGNOSIS — N390 Urinary tract infection, site not specified: Secondary | ICD-10-CM | POA: Diagnosis not present

## 2018-05-08 ENCOUNTER — Telehealth: Payer: Self-pay | Admitting: Cardiology

## 2018-05-08 NOTE — Telephone Encounter (Signed)
Pt thinks he may have another blockage. Please call [phone]

## 2018-05-08 NOTE — Telephone Encounter (Signed)
Pt notified and voiced understanding 

## 2018-05-08 NOTE — Telephone Encounter (Signed)
If his symptoms were not escalating, would have him keep the scheduled visit with me to discuss further work-up.  If there is a cancellation with APP prior to this, that would also be an option.  If his symptoms worsen in the meanwhile would recommend ER evaluation.

## 2018-05-08 NOTE — Telephone Encounter (Signed)
Pt called and c/o left arm numbness, and SOB for a month. Denies CP during this time. States that left foot is cold when he gets into bed at night and then become hot. Pt has appt with Dr. Domenic Polite on 1/21. Please advise.

## 2018-05-23 ENCOUNTER — Encounter: Payer: Self-pay | Admitting: Cardiology

## 2018-05-23 ENCOUNTER — Ambulatory Visit (INDEPENDENT_AMBULATORY_CARE_PROVIDER_SITE_OTHER): Payer: Medicare HMO | Admitting: Cardiology

## 2018-05-23 VITALS — BP 140/82 | HR 85 | Ht 70.0 in | Wt 145.0 lb

## 2018-05-23 DIAGNOSIS — R69 Illness, unspecified: Secondary | ICD-10-CM | POA: Diagnosis not present

## 2018-05-23 DIAGNOSIS — E782 Mixed hyperlipidemia: Secondary | ICD-10-CM

## 2018-05-23 DIAGNOSIS — F17200 Nicotine dependence, unspecified, uncomplicated: Secondary | ICD-10-CM

## 2018-05-23 DIAGNOSIS — I25119 Atherosclerotic heart disease of native coronary artery with unspecified angina pectoris: Secondary | ICD-10-CM | POA: Diagnosis not present

## 2018-05-23 DIAGNOSIS — I10 Essential (primary) hypertension: Secondary | ICD-10-CM

## 2018-05-23 MED ORDER — ISOSORBIDE MONONITRATE ER 30 MG PO TB24: 30.0000 mg | ORAL_TABLET | Freq: Every evening | ORAL | 3 refills | Status: DC

## 2018-05-23 NOTE — Progress Notes (Signed)
Cardiology Office Note  Date: 05/23/2018   ID: EGBERT SEIDEL, DOB Aug 23, 1935, MRN [id number]  PCP: Lysbeth Penner, FNP  Primary Cardiologist: Rozann Lesches, MD   Chief Complaint  Patient presents with  . Coronary Artery Disease    History of Present Illness: [name] Graham is an 83 y.o. male last seen in September 2019.  He presents for a follow-up visit.  He tells me that around the turn of the year he was having difficulty with urination and also constipation.  He was seen at [facility].  I do not have the records, but he tells me that he had a Foley catheter in place for a period of time, ultimately treated for UTI, then used laxatives with improvement in his bowel pattern.  During this time he has also had fatigue and intermittent angina symptoms. He does not have to use nitroglycerin consistently however.  Usually only 1 tablet and it may only be once or twice in a month.  Ischemic testing was in 2016 as outlined below.  I reviewed his medications.  I personally reviewed his ECG today which shows normal sinus rhythm with IVCD.  Past Medical History:  Diagnosis Date  . COPD (chronic obstructive pulmonary disease) ([place])   . Coronary atherosclerosis of native coronary artery    BMS circ and PTCA PDA 2007, DES RCA 10/09, DES LAD 2/12  . Essential hypertension   . GERD (gastroesophageal reflux disease)   . Hyperlipidemia   . NSTEMI (non-ST elevated myocardial infarction) ([place])    2007    Past Surgical History:  Procedure Laterality Date  . LIPOMA EXCISION Right     Current Outpatient Medications  Medication Sig Dispense Refill  . albuterol (PROVENTIL HFA;VENTOLIN HFA) 108 (90 Base) MCG/ACT inhaler Inhale 1-2 puffs into the lungs every 6 (six) hours as needed for wheezing or shortness of breath.    Marland Kitchen aspirin EC 81 MG tablet Take 1 tablet (81 mg total) by mouth daily. 90 tablet 3  . clopidogrel (PLAVIX) 75 MG tablet Take 1 tablet (75 mg total)  by mouth daily. 90 tablet 2  . HYDROcodone-acetaminophen (NORCO/VICODIN) 5-325 MG tablet Take one tab po q 4 hrs prn pain 15 tablet 0  . lisinopril (PRINIVIL,ZESTRIL) 40 MG tablet TAKE 1 TABLET BY MOUTH ONCE DAILY IN THE MORNING 90 tablet 3  . metoprolol succinate (TOPROL-XL) 25 MG 24 hr tablet TAKE 1/2 (ONE-HALF) TABLET BY MOUTH ONCE DAILY 45 tablet 3  . nitroGLYCERIN (NITROSTAT) 0.4 MG SL tablet Place 1 tablet (0.4 mg total) under the tongue every 5 (five) minutes x 3 doses as needed for chest pain (if no relief after 3rd dose, proceed to the ED for an evaluation). 25 tablet 3  . simvastatin (ZOCOR) 80 MG tablet TAKE 1 TABLET BY MOUTH ONCE DAILY AT BEDTIME 90 tablet 3   No current facility-administered medications for this visit.    Allergies:  Patient has no known allergies.   Social History: The patient  reports that he has been smoking cigarettes. He started smoking about 64 years ago. He has a 63.00 pack-year smoking history. He has never used smokeless tobacco. He reports that he does not drink alcohol or use drugs.   ROS:  Please see the history of present illness. Otherwise, complete review of systems is positive for hearing loss, chronic right hip pain.  All other systems are reviewed and negative.   Physical Exam: VS:  BP 140/82 (BP Location: Left Arm)  Pulse 85   Ht 5\' 10"  (1.778 m)   Wt 145 lb (65.8 kg)   SpO2 98%   BMI 20.81 kg/m , BMI Body mass index is 20.81 kg/m.  Wt Readings from Last 3 Encounters:  05/23/18 145 lb (65.8 kg)  01/12/18 138 lb (62.6 kg)  07/26/17 144 lb (65.3 kg)    General: Elderly male, appears comfortable at rest. HEENT: Conjunctiva and lids normal, oropharynx clear. Neck: Supple, no elevated JVP or carotid bruits, no thyromegaly. Lungs: Decreased breath sounds without wheezing, nonlabored breathing at rest. Cardiac: Regular rate and rhythm, no S3 or significant systolic murmur. Abdomen: Soft, nontender, bowel sounds present. Extremities: No  pitting edema, distal pulses 2+. Skin: Warm and dry. Musculoskeletal: No kyphosis. Neuropsychiatric: Alert and oriented x3, affect grossly appropriate.  ECG: I personally reviewed the tracing from 07/05/2017 which shows sinus rhythm with left anterior fascicular block, PACs and PVCs.  Recent Labwork:    Component Value Date/Time   CHOL 138 05/09/2014 0734   CHOL 144 06/15/2013 0931   TRIG 92 05/09/2014 0734   HDL 55 05/09/2014 0734   HDL 61 06/15/2013 0931   CHOLHDL 2.5 05/09/2014 0734   VLDL 18 05/09/2014 0734   LDLCALC 65 05/09/2014 0734   LDLCALC 69 06/15/2013 0931    Other Studies Reviewed Today:  Echocardiogram12/02/2016: Study Conclusions  - Left ventricle: The cavity size was normal. Wall thickness was increased in a pattern of moderate LVH. Systolic function was normal. The estimated ejection fraction was in the range of 50% to 55%. Alba function is abnormal, indeterminate grade. Wall motion was normal; there were no regional wall motion abnormalities. - Aortic valve: There was trivial regurgitation. Valve area (VTI): 2.83 cm^2. Valve area (Vmax): 3.32 cm^2. Valve area (Vmean): 2.67 cm^2. - Mitral valve: There was mild regurgitation. - Left atrium: The atrium was mildly dilated. - Atrial septum: No defect or patent foramen ovale was identified. - Techncally adequate study.  Lexiscan Myoview 05/17/2014: IMPRESSION: 1. Evidence of diaphragmatic attenuation with fixed inferior defect as outlined, also a small region of apical ischemia noted. There are no large ischemic territories.  2. Normal left ventricular wall motion.  3. Left ventricular ejection fraction 52%  4. Low-risk stress test findings*.  Assessment and Plan:  1.  CAD with history of multivessel PCI, most recently DES to the LAD in 2012.  He reports exertional fatigue and intermittent angina symptoms.  Last ischemic evaluation was in 2016.  ECG reviewed and stable.  Plan to  add Imdur 30 mg daily and follow-up with a Lexiscan Myoview to reassess ischemic burden.  2.  Mixed hyperlipidemia on Zocor.  Keep follow-up with PCP.  3.  Longstanding tobacco abuse.  He has not been able to quit.  4.  Essential hypertension, he continues on lisinopril and Toprol-XL.  Current medicines were reviewed with the patient today.   Orders Placed This Encounter  Procedures  . NM Myocar Multi W/Spect W/Wall Motion / EF  . EKG 12-Lead    Disposition: Follow-up in 3 months.  Signed, Satira Sark, MD, [facility] 05/23/2018 9:11 AM    [place] at [facility] [address] [place], [place] [address] Phone: [phone]; Fax: [phone]

## 2018-05-23 NOTE — Patient Instructions (Signed)
Medication Instructions:  START Imdur 30 mg daily in the evening If you need a refill on your cardiac medications before your next appointment, please call your pharmacy.   Lab work: None today If you have labs (blood work) drawn today and your tests are completely normal, you will receive your results only by: Marland Kitchen MyChart Message (if you have MyChart) OR . A paper copy in the mail If you have any lab test that is abnormal or we need to change your treatment, we will call you to review the results.  Testing/Procedures: Your physician has requested that you have a lexiscan myoview. For further information please visit [email or web address]. Please follow instruction sheet, as given.    Follow-Up: At [facility], you and your health needs are our priority.  As part of our continuing mission to provide you with exceptional heart care, we have created designated Provider Care Teams.  These Care Teams include your primary Cardiologist (physician) and Advanced Practice Providers (APPs -  Physician Assistants and Nurse Practitioners) who all work together to provide you with the care you need, when you need it. You will need a follow up appointment in 3 months.  Please call our office 2 months in advance to schedule this appointment.  You may see Rozann Lesches, MD or one of the following Advanced Practice Providers on your designated Care Team:   Bernerd Pho, PA-C [facility]) . Ermalinda Barrios, PA-C ([place])  Any Other Special Instructions Will Be Listed Below (If Applicable). None

## 2018-05-25 ENCOUNTER — Telehealth: Payer: Self-pay | Admitting: Cardiology

## 2018-05-25 NOTE — Telephone Encounter (Signed)
Pt stated that he does not tolerate imdur.His head felt like it was splitting into.  He does not want to take it any more. Sending as an Pharmacist, hospital.

## 2018-05-25 NOTE — Telephone Encounter (Signed)
[name] Graham. Informed pt.

## 2018-05-25 NOTE — Telephone Encounter (Signed)
Pt called stating that when he took the new medication Dr. Domenic Polite put him on this week, it gave him the worst headache he's had in 20 years and he didn't sleep at all last night. He's no sure what the name of it is.

## 2018-05-25 NOTE — Telephone Encounter (Signed)
Noted, we will see what his Myoview shows.

## 2018-06-02 ENCOUNTER — Encounter (HOSPITAL_COMMUNITY): Payer: Self-pay

## 2018-06-02 ENCOUNTER — Telehealth: Payer: Self-pay

## 2018-06-02 ENCOUNTER — Encounter (HOSPITAL_COMMUNITY)
Admission: RE | Admit: 2018-06-02 | Discharge: 2018-06-02 | Disposition: A | Discharge status: Home / Self Care | Payer: Medicare HMO | Source: Ambulatory Visit | Attending: Cardiology | Admitting: Cardiology

## 2018-06-02 ENCOUNTER — Encounter (HOSPITAL_BASED_OUTPATIENT_CLINIC_OR_DEPARTMENT_OTHER)
Admission: RE | Admit: 2018-06-02 | Discharge: 2018-06-02 | Disposition: A | Discharge status: Home / Self Care | Payer: Medicare HMO | Source: Ambulatory Visit | Attending: Cardiology | Admitting: Cardiology

## 2018-06-02 DIAGNOSIS — R931 Abnormal findings on diagnostic imaging of heart and coronary circulation: Secondary | ICD-10-CM

## 2018-06-02 DIAGNOSIS — I25119 Atherosclerotic heart disease of native coronary artery with unspecified angina pectoris: Secondary | ICD-10-CM | POA: Diagnosis not present

## 2018-06-02 LAB — NM MYOCAR MULTI W/SPECT W/WALL MOTION / EF
LV dias vol: 136 mL (ref 62–150)
LVSYSVOL: 88 mL
Peak HR: 85 {beats}/min
RATE: 0.3
Rest HR: 60 {beats}/min
SDS: 3
SRS: 7
SSS: 10
TID: 1.09

## 2018-06-02 MED ORDER — REGADENOSON 0.4 MG/5ML IV SOLN
INTRAVENOUS | Status: AC
  Administered 2018-06-02: 0.4 mg via INTRAVENOUS
  Filled 2018-06-02: qty 5

## 2018-06-02 MED ORDER — TECHNETIUM TC 99M TETROFOSMIN IV KIT
10.0000 | PACK | Freq: Once | INTRAVENOUS | Status: AC | PRN
  Administered 2018-06-02: 10.8 via INTRAVENOUS

## 2018-06-02 MED ORDER — SODIUM CHLORIDE 0.9% FLUSH
INTRAVENOUS | Status: AC
  Administered 2018-06-02: 10 mL via INTRAVENOUS
  Filled 2018-06-02: qty 10

## 2018-06-02 MED ORDER — TECHNETIUM TC 99M TETROFOSMIN IV KIT
30.0000 | PACK | Freq: Once | INTRAVENOUS | Status: AC | PRN
  Administered 2018-06-02: 33 via INTRAVENOUS

## 2018-06-02 NOTE — Telephone Encounter (Signed)
-----   Message from Satira Sark, MD sent at 06/02/2018  1:00 PM EST ----- Results reviewed.  Stress test shows previous infarct scar with mild inferior distribution ischemia predominantly.  The ejection fraction of 35% is however not consistent with previous evaluation.  Please obtain an echocardiogram to see if he has had a true decline in LV function. A copy of this test should be forwarded to Lysbeth Penner, FNP.

## 2018-06-02 NOTE — Telephone Encounter (Signed)
Pt notified , scheduled echo for 2/5 at 9:15 am

## 2018-06-07 ENCOUNTER — Ambulatory Visit (HOSPITAL_COMMUNITY)
Admission: RE | Admit: 2018-06-07 | Discharge: 2018-06-07 | Disposition: A | Discharge status: Home / Self Care | Payer: Medicare HMO | Source: Ambulatory Visit | Attending: Cardiology | Admitting: Cardiology

## 2018-06-07 DIAGNOSIS — I252 Old myocardial infarction: Secondary | ICD-10-CM | POA: Insufficient documentation

## 2018-06-07 DIAGNOSIS — I10 Essential (primary) hypertension: Secondary | ICD-10-CM | POA: Insufficient documentation

## 2018-06-07 DIAGNOSIS — R931 Abnormal findings on diagnostic imaging of heart and coronary circulation: Secondary | ICD-10-CM | POA: Diagnosis not present

## 2018-06-07 DIAGNOSIS — F17200 Nicotine dependence, unspecified, uncomplicated: Secondary | ICD-10-CM | POA: Insufficient documentation

## 2018-06-07 DIAGNOSIS — R69 Illness, unspecified: Secondary | ICD-10-CM | POA: Diagnosis not present

## 2018-06-07 DIAGNOSIS — E785 Hyperlipidemia, unspecified: Secondary | ICD-10-CM | POA: Insufficient documentation

## 2018-06-07 NOTE — Progress Notes (Signed)
*  PRELIMINARY RESULTS* Echocardiogram 2D Echocardiogram has been performed.  [name] Graham 06/07/2018, 10:22 AM

## 2018-06-08 ENCOUNTER — Telehealth: Payer: Self-pay | Admitting: Cardiology

## 2018-06-08 MED ORDER — METOPROLOL SUCCINATE ER 25 MG PO TB24: ORAL_TABLET | ORAL | 3 refills | Status: DC

## 2018-06-08 NOTE — Addendum Note (Signed)
Addended by: Barbarann Ehlers A on: 06/08/2018 09:34 AM   Modules accepted: Orders

## 2018-06-08 NOTE — Telephone Encounter (Signed)
Pt will come on Monday 06/12/18 at 11:20 to see Bernerd Pho PA-C

## 2018-06-08 NOTE — Progress Notes (Signed)
Cardiology Office Note  Date: 06/12/2018   ID: [name], Graham 07-18-1935, MRN [id number]  PCP: Lysbeth Penner, FNP  Primary Cardiologist: Rozann Lesches, MD   Chief Complaint  Patient presents with  . Coronary Artery Disease    History of Present Illness: [name] Graham is an 83 y.o. male seen recently for evaluation of exertional dyspnea and fatigue as well as intermittent angina symptoms.  He was continued on medical therapy with the addition of Imdur and follow-up testing arranged.  Lexiscan Myoview was deemed high risk in the setting of LV dysfunction with LVEF 35% in association with combination of scar and peri-infarct ischemia in the inferior/inferoseptal distribution predominantly.  Echocardiogram confirmed LVEF in the range of 35 to 40% with akinesis of the apical inferior wall.  He is here today with his daughter for a follow-up visit.  We discussed the results of his recent testing.  He tells me that he feels better, less short of breath and not using any nitroglycerin recently.  He did not tolerate Imdur however and stop the medication.  He has continued on his other regimen.  Today we discussed considering a switch from lisinopril to Entresto in light of LV dysfunction.  I also talked with him about the possibility of a cardiac catheterization, however with no progressive symptoms at this time we decided to hold off and reevaluate.  Past Medical History:  Diagnosis Date  . COPD (chronic obstructive pulmonary disease) ([place])   . Coronary atherosclerosis of native coronary artery    BMS circ and PTCA PDA 2007, DES RCA 10/09, DES LAD 2/12  . Essential hypertension   . GERD (gastroesophageal reflux disease)   . Hyperlipidemia   . NSTEMI (non-ST elevated myocardial infarction) ([place])    2007    Past Surgical History:  Procedure Laterality Date  . LIPOMA EXCISION Right     Current Outpatient Medications  Medication Sig Dispense Refill  . albuterol  (PROVENTIL HFA;VENTOLIN HFA) 108 (90 Base) MCG/ACT inhaler Inhale 1-2 puffs into the lungs every 6 (six) hours as needed for wheezing or shortness of breath.    Marland Kitchen aspirin EC 81 MG tablet Take 1 tablet (81 mg total) by mouth daily. 90 tablet 3  . clopidogrel (PLAVIX) 75 MG tablet Take 1 tablet (75 mg total) by mouth daily. 90 tablet 2  . HYDROcodone-acetaminophen (NORCO/VICODIN) 5-325 MG tablet Take one tab po q 4 hrs prn pain 15 tablet 0  . metoprolol succinate (TOPROL-XL) 25 MG 24 hr tablet TAKE 1/2 (ONE-HALF) TABLET BY MOUTH ONCE DAILY 45 tablet 3  . nitroGLYCERIN (NITROSTAT) 0.4 MG SL tablet Place 1 tablet (0.4 mg total) under the tongue every 5 (five) minutes x 3 doses as needed for chest pain (if no relief after 3rd dose, proceed to the ED for an evaluation). 25 tablet 3  . simvastatin (ZOCOR) 80 MG tablet TAKE 1 TABLET BY MOUTH ONCE DAILY AT BEDTIME 90 tablet 3  . sacubitril-valsartan (ENTRESTO) 24-26 MG Take 1 tablet by mouth 2 (two) times daily. 60 tablet 6   No current facility-administered medications for this visit.    Allergies:  Patient has no known allergies.   Social History: The patient  reports that he has been smoking cigarettes. He started smoking about 65 years ago. He has a 63.00 pack-year smoking history. He has never used smokeless tobacco. He reports that he does not drink alcohol or use drugs.   ROS:  Please see the history of present illness.  Otherwise, complete review of systems is positive for hearing loss.  All other systems are reviewed and negative.   Physical Exam: VS:  BP (!) 152/84   Pulse 67   Ht 5\' 10"  (1.778 m)   Wt 146 lb (66.2 kg)   SpO2 97%   BMI 20.95 kg/m , BMI Body mass index is 20.95 kg/m.  Wt Readings from Last 3 Encounters:  06/12/18 146 lb (66.2 kg)  05/23/18 145 lb (65.8 kg)  01/12/18 138 lb (62.6 kg)    General: Elderly male, appears comfortable at rest. HEENT: Conjunctiva and lids normal, oropharynx clear. Neck: Supple, no elevated  JVP or carotid bruits, no thyromegaly. Lungs: Diminished breath sounds without wheezing, nonlabored breathing at rest. Cardiac: Regular rate and rhythm, no S3 or significant systolic murmur, no pericardial rub. Abdomen: Soft, nontender, bowel sounds present. Extremities: No pitting edema, distal pulses 2+. Skin: Warm and dry. Musculoskeletal: No kyphosis. Neuropsychiatric: Alert and oriented x3, affect grossly appropriate.  ECG: I personally reviewed the tracing from 05/23/2018 which showed sinus rhythm with IVCD.  Recent Labwork: No results found for requested labs within last 8760 hours.     Component Value Date/Time   CHOL 138 05/09/2014 0734   CHOL 144 06/15/2013 0931   TRIG 92 05/09/2014 0734   HDL 55 05/09/2014 0734   HDL 61 06/15/2013 0931   CHOLHDL 2.5 05/09/2014 0734   VLDL 18 05/09/2014 0734   LDLCALC 65 05/09/2014 0734   LDLCALC 69 06/15/2013 0931    Other Studies Reviewed Today:  Lexiscan Myoview 06/02/2018:  Defect 1: There is a large defect of moderate severity present in the mid anteroseptal, mid inferoseptal, mid inferior, apical septal and apical inferior location.  Findings consistent with prior myocardial infarction with a mild degree of inferior/inferoseptal peri-infarct ischemia.  This is a high risk study primarily due to the combination of severe LV dysfunction and degree of myocardial scar/peri-infarct ischemia.  Nuclear stress EF: 35%.  LAFB and nonspecific IVCD seen throughout study with occasional PAC's and PVC's with stress.  Echocardiogram 06/07/2018:  1. The left ventricle has moderately reduced systolic function of 16-10%. The cavity size is mildly increased. There is mildly increased left ventricular wall thickness of the septal wall. Echo evidence of impaired diastolic relaxation.  2. There is akinesis of the apical inferior left ventricular segment.  3. The right ventricle has normal systolic function. The cavity in normal in size. There is no  increase in right ventricular wall thickness. RVSP estimated 23 mmHg.  4. The aortic valve is tricuspid. There is mild calcification of the aortic valve. There is mild aortic annular calcification noted.  5. The mitral valve is normal in structure There is mild calcification.  6. The aortic root is normal in size and structure.  7. The pulmonic valve is grossly normal.  Assessment and Plan:  1.  CAD with history of multivessel PCI, most recently DES to the LAD in 2012.  He tells me that his exertional symptoms have improved, although he was not able to tolerate Imdur.  Recent ischemic and structural cardiac testing indicates LVEF 35 to 40% range with evidence of myocardial scar and mild inferior/inferoseptal peri-infarct ischemia.  We discussed the possibility of cardiac catheterization, but for now will switch from lisinopril to [facility] and reevaluate clinically.  Follow-up BMET after medication change.  2.  Mixed hyperlipidemia on Zocor.  Keep follow-up with PCP.  3.  Longstanding tobacco abuse.  Smoking cessation has been discussed over time, but he  has not been motivated to quit.  4.  Essential hypertension, continue Toprol-XL, switching from lisinopril to [facility].  Current medicines were reviewed with the patient today.   Orders Placed This Encounter  Procedures  . Basic Metabolic Panel (BMET)    Disposition: Follow-up in 6 weeks.  Signed, Satira Sark, MD, [facility] 06/12/2018 11:40 AM    [place] at [place] [place], [place] [address] Phone: [phone]; Fax: [phone]

## 2018-06-08 NOTE — Telephone Encounter (Signed)
Pt is needing authorization by Dr. Domenic Polite to refill his metoprolol succinate (TOPROL-XL) 25 MG 24 hr tablet [[id number]] per [facility]   Pt would also like the results of his Echo

## 2018-06-12 ENCOUNTER — Encounter: Payer: Self-pay | Admitting: Cardiology

## 2018-06-12 ENCOUNTER — Ambulatory Visit (INDEPENDENT_AMBULATORY_CARE_PROVIDER_SITE_OTHER): Payer: Medicare HMO | Admitting: Cardiology

## 2018-06-12 VITALS — BP 152/84 | HR 67 | Ht 70.0 in | Wt 146.0 lb

## 2018-06-12 DIAGNOSIS — E782 Mixed hyperlipidemia: Secondary | ICD-10-CM

## 2018-06-12 DIAGNOSIS — F17200 Nicotine dependence, unspecified, uncomplicated: Secondary | ICD-10-CM | POA: Diagnosis not present

## 2018-06-12 DIAGNOSIS — I10 Essential (primary) hypertension: Secondary | ICD-10-CM | POA: Diagnosis not present

## 2018-06-12 DIAGNOSIS — I255 Ischemic cardiomyopathy: Secondary | ICD-10-CM

## 2018-06-12 DIAGNOSIS — I25119 Atherosclerotic heart disease of native coronary artery with unspecified angina pectoris: Secondary | ICD-10-CM

## 2018-06-12 DIAGNOSIS — R69 Illness, unspecified: Secondary | ICD-10-CM | POA: Diagnosis not present

## 2018-06-12 MED ORDER — SACUBITRIL-VALSARTAN 24-26 MG PO TABS: 1.0000 | ORAL_TABLET | Freq: Two times a day (BID) | ORAL | 6 refills | Status: DC

## 2018-06-12 NOTE — Patient Instructions (Signed)
Medication Instructions:  STOP Lisinopril  STOP Imdur  START Entresto 24/26 mg 1 tablet twice a day If you need a refill on your cardiac medications before your next appointment, please call your pharmacy.   Lab work: BMET 1 week after you start Entresto If you have labs (blood work) drawn today and your tests are completely normal, you will receive your results only by: Marland Kitchen MyChart Message (if you have MyChart) OR . A paper copy in the mail If you have any lab test that is abnormal or we need to change your treatment, we will call you to review the results.  Testing/Procedures: None today  Follow-Up: At [facility], you and your health needs are our priority.  As part of our continuing mission to provide you with exceptional heart care, we have created designated Provider Care Teams.  These Care Teams include your primary Cardiologist (physician) and Advanced Practice Providers (APPs -  Physician Assistants and Nurse Practitioners) who all work together to provide you with the care you need, when you need it. You will need a follow up appointment in 6 weeks.  Please call our office 2 months in advance to schedule this appointment.  You may see Rozann Lesches, MD or one of the following Advanced Practice Providers on your designated Care Team:   Bernerd Pho, PA-C [facility]) . Ermalinda Barrios, PA-C ([place])  Any Other Special Instructions Will Be Listed Below (If Applicable). None

## 2018-06-16 ENCOUNTER — Telehealth: Payer: Self-pay | Admitting: Cardiology

## 2018-06-16 NOTE — Telephone Encounter (Signed)
Pt and family are going to [place] with him. He will pick up some Benadryl now and go to ED if sx's worsen. I asked him to call me back on Monday

## 2018-06-16 NOTE — Addendum Note (Signed)
Addended by: Barbarann Ehlers A on: 06/16/2018 05:03 PM   Modules accepted: Orders

## 2018-06-16 NOTE — Telephone Encounter (Signed)
Noted.  Does not sound like definitive allergic reaction or angioedema.  He was already on an ACE inhibitor previously and tolerated that.  If he is actually feeling better, could continue to observe for now as this may resolve.  If the symptoms progress however, he would need to stop Entresto.

## 2018-06-16 NOTE — Telephone Encounter (Signed)
Patient called back, has itching on nose,lips feel chapped . I told him to stop Entresto. [place]

## 2018-06-16 NOTE — Telephone Encounter (Signed)
Thank you.  I agree.  See if his symptoms resolve after stopping the medication.  Consider taking Benadryl as well as a precaution.  If he gets worse or becomes short of breath, should be seen in the ER.

## 2018-06-16 NOTE — Telephone Encounter (Signed)
On Entresto for 4 days.Yesterday he noted  after he takes his Delene Loll he has itching on top of head, back of neck and tops of shoulders. States it lasts for 30 minutes and then resolves.Denies itching on face, throat.He says he feels much better on Entresto and he does not want to stop taking it.

## 2018-06-16 NOTE — Telephone Encounter (Signed)
Pt would like to speak w/ the nurse concerning the Entresto. He's started itching after he takes it, he began to notice it after taking it yesterday morning.

## 2018-06-19 NOTE — Telephone Encounter (Signed)
I spoke with patient and he has had further itching after stopping Entresto on Friday 06/16/18  Does he need lab work tomorrow since he only took [place] for 4 days?   Is there another medication you want him to take ?

## 2018-06-19 NOTE — Telephone Encounter (Signed)
Patient is going to get his bmet tomorrow.Wonders if he should go back on lisinopril.I told him I will call him after I get instructions from [place]

## 2018-06-19 NOTE — Telephone Encounter (Signed)
Pt called stating he's back in town, would like to know what he needs to do. Would like [place] to please give him a call.

## 2018-06-19 NOTE — Telephone Encounter (Signed)
Get follow-up BMET results. Most likely we will resume Lisinopril.

## 2018-06-20 ENCOUNTER — Telehealth: Payer: Self-pay

## 2018-06-20 ENCOUNTER — Other Ambulatory Visit (HOSPITAL_COMMUNITY)
Admission: RE | Admit: 2018-06-20 | Discharge: 2018-06-20 | Disposition: A | Discharge status: Home / Self Care | Payer: Medicare HMO | Source: Ambulatory Visit | Attending: Cardiology | Admitting: Cardiology

## 2018-06-20 DIAGNOSIS — I255 Ischemic cardiomyopathy: Secondary | ICD-10-CM | POA: Diagnosis not present

## 2018-06-20 LAB — BASIC METABOLIC PANEL
Anion gap: 7 (ref 5–15)
BUN: 24 mg/dL — ABNORMAL HIGH (ref 8–23)
CO2: 26 mmol/L (ref 22–32)
CREATININE: 0.96 mg/dL (ref 0.61–1.24)
Calcium: 9.1 mg/dL (ref 8.9–10.3)
Chloride: 105 mmol/L (ref 98–111)
GFR calc Af Amer: >60 mL/min (ref >60)
GFR calc non Af Amer: >60 mL/min (ref >60)
Glucose, Bld: 95 mg/dL (ref 70–99)
Potassium: 4.2 mmol/L (ref 3.5–5.1)
Sodium: 138 mmol/L (ref 135–145)

## 2018-06-20 MED ORDER — LISINOPRIL 40 MG PO TABS: 40.0000 mg | ORAL_TABLET | Freq: Every day | ORAL | 3 refills | Status: DC

## 2018-06-20 NOTE — Telephone Encounter (Signed)
-----   Message from Satira Sark, MD sent at 06/20/2018 10:08 AM EST ----- Results reviewed.  Potassium and renal function normal.  If he is back to baseline, go ahead and resume previous lisinopril dose. A copy of this test should be forwarded to Lysbeth Penner, FNP.

## 2018-06-20 NOTE — Telephone Encounter (Signed)
Pt notified of lab results, has had no further problems with itching or swelling after stopping Entresto. Will resume Lisinopril 40 mg daily

## 2018-06-21 DIAGNOSIS — R079 Chest pain, unspecified: Secondary | ICD-10-CM | POA: Diagnosis not present

## 2018-07-07 ENCOUNTER — Telehealth: Payer: Self-pay | Admitting: *Deleted

## 2018-07-07 DIAGNOSIS — R911 Solitary pulmonary nodule: Secondary | ICD-10-CM

## 2018-07-07 NOTE — Telephone Encounter (Signed)
-----   Message from Tanda Rockers, MD sent at 08/01/2017  6:05 AM EDT ----- Ct s contrast due f/u snp

## 2018-07-07 NOTE — Telephone Encounter (Signed)
Spoke with the pt and notified that he is due for ct 2/36/2020  He verbalized understanding  Order sent to [facility]

## 2018-07-25 ENCOUNTER — Other Ambulatory Visit: Payer: Self-pay

## 2018-07-25 MED ORDER — LISINOPRIL 40 MG PO TABS: 40.0000 mg | ORAL_TABLET | Freq: Every day | ORAL | 3 refills | Status: DC

## 2018-07-25 MED ORDER — CLOPIDOGREL BISULFATE 75 MG PO TABS: 75.0000 mg | ORAL_TABLET | Freq: Every day | ORAL | 2 refills | Status: DC

## 2018-07-25 MED ORDER — METOPROLOL SUCCINATE ER 25 MG PO TB24: ORAL_TABLET | ORAL | 3 refills | Status: DC

## 2018-07-25 MED ORDER — SIMVASTATIN 80 MG PO TABS: 80.0000 mg | ORAL_TABLET | Freq: Every day | ORAL | 3 refills | Status: DC

## 2018-07-27 ENCOUNTER — Ambulatory Visit (HOSPITAL_COMMUNITY): Payer: Medicare HMO

## 2018-07-28 ENCOUNTER — Ambulatory Visit: Payer: Medicare HMO | Admitting: Cardiology

## 2018-08-24 ENCOUNTER — Telehealth: Payer: Self-pay | Admitting: Cardiology

## 2018-08-24 NOTE — Telephone Encounter (Signed)
Virtual Visit Pre-Appointment Phone Call  "(Name), I am calling you today to discuss your upcoming appointment. We are currently trying to limit exposure to the virus that causes COVID-19 by seeing patients at home rather than in the office."  1. "What is the BEST phone number to call the day of the visit?" - include this in appointment notes  2. Do you have or have access to (through a family member/friend) a smartphone with video capability that we can use for your visit?" a. If yes - list this number in appt notes as cell (if different from BEST phone #) and list the appointment type as a VIDEO visit in appointment notes b. If no - list the appointment type as a PHONE visit in appointment notes  3. Confirm consent - "In the setting of the current Covid19 crisis, you are scheduled for a (phone or video) visit with your provider on (date) at (time).  Just as we do with many in-office visits, in order for you to participate in this visit, we must obtain consent.  If you'd like, I can send this to your mychart (if signed up) or email for you to review.  Otherwise, I can obtain your verbal consent now.  All virtual visits are billed to your insurance company just like a normal visit would be.  By agreeing to a virtual visit, we'd like you to understand that the technology does not allow for your provider to perform an examination, and thus may limit your provider's ability to fully assess your condition. If your provider identifies any concerns that need to be evaluated in person, we will make arrangements to do so.  Finally, though the technology is pretty good, we cannot assure that it will always work on either your or our end, and in the setting of a video visit, we may have to convert it to a phone-only visit.  In either situation, we cannot ensure that we have a secure connection.  Are you willing to proceed?" STAFF: Did the patient verbally acknowledge consent to telehealth visit? Document  YES/NO here: Yes  4. Advise patient to be prepared - "Two hours prior to your appointment, go ahead and check your blood pressure, pulse, oxygen saturation, and your weight (if you have the equipment to check those) and write them all down. When your visit starts, your provider will ask you for this information. If you have an Apple Watch or Kardia device, please plan to have heart rate information ready on the day of your appointment. Please have a pen and paper handy nearby the day of the visit as well."  5. Give patient instructions for MyChart download to smartphone OR [email or web address] as below if video visit (depending on what platform provider is using)  6. Inform patient they will receive a phone call 15 minutes prior to their appointment time (may be from unknown caller ID) so they should be prepared to answer    TELEPHONE CALL NOTE  [name] Graham has been deemed a candidate for a follow-up tele-health visit to limit community exposure during the Covid-19 pandemic. I spoke with the patient via phone to ensure availability of phone/video source, confirm preferred email & phone number, and discuss instructions and expectations.  I reminded [name] Graham to be prepared with any vital sign and/or heart rhythm information that could potentially be obtained via home monitoring, at the time of his visit. I reminded [name] Graham to expect a phone call prior to  his visit.  Orinda Kenner 08/24/2018 9:46 AM

## 2018-08-24 NOTE — Progress Notes (Signed)
Virtual Visit via Telephone Note   This visit type was conducted due to national recommendations for restrictions regarding the COVID-19 Pandemic (e.g. social distancing) in an effort to limit this patient's exposure and mitigate transmission in our community.  Due to his co-morbid illnesses, this patient is at least at moderate risk for complications without adequate follow up.  This format is felt to be most appropriate for this patient at this time.  The patient did not have access to video technology/had technical difficulties with video requiring transitioning to audio format only (telephone).  All issues noted in this document were discussed and addressed.  No physical exam could be performed with this format.  Please refer to the patient's chart for his  consent to telehealth for [facility]   Evaluation Performed:  Follow-up visit  Date:  08/25/2018   ID:  [name] Graham, DOB [birth year], MRN [id number]  Patient Location: Home Provider Location: Home  PCP:  Lysbeth Penner, FNP  Cardiologist:  Rozann Lesches, MD  Chief Complaint:  Follow-up cardiomyopathy  History of Present Illness:    KAIREN Graham is a 83 y.o. male last seen in February.  He did not have video access today and we spoke by phone.  He and his wife are staying in their summer home in [place].  [place] away from [place].  He has been doing a lot of chores, reports NYHA class II dyspnea, no angina, palpitations, or syncope.  We did attempt to switch from lisinopril to [facility] after last visit, however he did not tolerate this describing pruritus.  He was able to switch back to lisinopril without incident.  Follow-up lab work is noted below.  We reviewed his current medications and he reports compliance, no intolerances at this point.  The patient does not have symptoms concerning for COVID-19 infection (fever, chills, cough, or new shortness of breath).   Past Medical History:   Diagnosis Date  . COPD (chronic obstructive pulmonary disease) ([place])   . Coronary atherosclerosis of native coronary artery    BMS circ and PTCA PDA 2007, DES RCA 10/09, DES LAD 2/12  . Essential hypertension   . GERD (gastroesophageal reflux disease)   . Hyperlipidemia   . NSTEMI (non-ST elevated myocardial infarction) ([place])    2007   Past Surgical History:  Procedure Laterality Date  . LIPOMA EXCISION Right      Current Meds  Medication Sig  . aspirin EC 81 MG tablet Take 1 tablet (81 mg total) by mouth daily.  . clopidogrel (PLAVIX) 75 MG tablet Take 1 tablet (75 mg total) by mouth daily.  Marland Kitchen lisinopril (PRINIVIL,ZESTRIL) 40 MG tablet Take 1 tablet (40 mg total) by mouth daily.  . metoprolol succinate (TOPROL-XL) 25 MG 24 hr tablet TAKE 1/2 (ONE-HALF) TABLET BY MOUTH ONCE DAILY  . nitroGLYCERIN (NITROSTAT) 0.4 MG SL tablet Place 1 tablet (0.4 mg total) under the tongue every 5 (five) minutes x 3 doses as needed for chest pain (if no relief after 3rd dose, proceed to the ED for an evaluation).  . simvastatin (ZOCOR) 80 MG tablet Take 1 tablet (80 mg total) by mouth at bedtime.     Allergies:   Entresto [sacubitril-valsartan]   Social History   Tobacco Use  . Smoking status: Current Every Day Smoker    Packs/day: 1.00    Years: 63.00    Pack years: 63.00    Types: Cigarettes    Start date: 06/15/1953  .  Smokeless tobacco: Never Used  . Tobacco comment: 1 pack a day  Substance Use Topics  . Alcohol use: No    Alcohol/week: 0.0 standard drinks  . Drug use: No     Family Hx: The patient's family history includes Cancer in his father; Heart attack in his mother.  ROS:   Please see the history of present illness.    Hearing loss. All other systems reviewed and are negative.   Prior CV studies:   The following studies were reviewed today:  Lexiscan Myoview 06/02/2018:  Defect 1: There is a large defect of moderate severity present in the mid anteroseptal, mid  inferoseptal, mid inferior, apical septal and apical inferior location.  Findings consistent with prior myocardial infarction with a mild degree of inferior/inferoseptal peri-infarct ischemia.  This is a high risk study primarily due to the combination of severe LV dysfunction and degree of myocardial scar/peri-infarct ischemia.  Nuclear stress EF: 35%.  LAFB and nonspecific IVCD seen throughout study with occasional PAC's and PVC's with stress.  Echocardiogram 06/07/2018: 1. The left ventricle has moderately reduced systolic function of 72-53%. The cavity size is mildly increased. There is mildly increased left ventricular wall thickness of the septal wall. Echo evidence of impaired diastolic relaxation. 2. There is akinesis of the apical inferior left ventricular segment. 3. The right ventricle has normal systolic function. The cavity in normal in size. There is no increase in right ventricular wall thickness. RVSP estimated 23 mmHg. 4. The aortic valve is tricuspid. There is mild calcification of the aortic valve. There is mild aortic annular calcification noted. 5. The mitral valve is normal in structure There is mild calcification. 6. The aortic root is normal in size and structure. 7. The pulmonic valve is grossly normal.  Labs/Other Tests and Data Reviewed:    EKG:  An ECG dated 05/23/2018 was personally reviewed today and demonstrated:  Sinus rhythm with IVCD.  Recent Labs: 06/20/2018: BUN 24; Creatinine, Ser 0.96; Potassium 4.2; Sodium 138   Recent Lipid Panel Lab Results  Component Value Date/Time   CHOL 138 05/09/2014 07:34 AM   CHOL 144 06/15/2013 09:31 AM   TRIG 92 05/09/2014 07:34 AM   HDL 55 05/09/2014 07:34 AM   HDL 61 06/15/2013 09:31 AM   CHOLHDL 2.5 05/09/2014 07:34 AM   LDLCALC 65 05/09/2014 07:34 AM   LDLCALC 69 06/15/2013 09:31 AM    Wt Readings from Last 3 Encounters:  08/25/18 148 lb (67.1 kg)  06/12/18 146 lb (66.2 kg)  05/23/18 145 lb (65.8 kg)      Objective:    Vital Signs:  Ht 5\' 10"  (1.778 m)   Wt 148 lb (67.1 kg)   BMI 21.24 kg/m    Patient answered questions spontaneously on the phone.  His tone and speech pattern were normal.  He was not breathless while speaking in full sentences.  ASSESSMENT & PLAN:    1.  CAD status post multivessel PCI, most recently DES to the LAD in 2012.  Recent Myoview indicated LVEF 35 to 40% with myocardial scar and mild inferior/inferoseptal peri-infarct ischemia. He reports no angina symptoms on medical therapy and prefers to hold off cardiac catheterization at this point.  He did not tolerate Imdur due to headaches.  Continue with current regimen.  2.  Ischemic cardiomyopathy with LVEF 35 to 40%.  We attempted to switch from lisinopril to [facility] but he developed a rash and we had to switch back.  Follow-up lab work reviewed.  3.  Mixed hyperlipidemia, continues on Zocor.  He follows with PCP.  4.  Essential hypertension, continue with current regimen.  COVID-19 Education: The signs and symptoms of COVID-19 were discussed with the patient and how to seek care for testing (follow up with PCP or arrange E-visit).  The importance of social distancing was discussed today.  Time:   Today, I have spent 7 minutes with the patient with telehealth technology discussing the above problems.     Medication Adjustments/Labs and Tests Ordered: Current medicines are reviewed at length with the patient today.  Concerns regarding medicines are outlined above.   Tests Ordered: No orders of the defined types were placed in this encounter.   Medication Changes: No orders of the defined types were placed in this encounter.   Disposition:  Follow up 3 months in [place] office.  Signed, Rozann Lesches, MD  08/25/2018 9:44 AM    [place]

## 2018-08-25 ENCOUNTER — Encounter: Payer: Self-pay | Admitting: Cardiology

## 2018-08-25 ENCOUNTER — Telehealth (INDEPENDENT_AMBULATORY_CARE_PROVIDER_SITE_OTHER): Payer: Medicare HMO | Admitting: Cardiology

## 2018-08-25 VITALS — Ht 70.0 in | Wt 148.0 lb

## 2018-08-25 DIAGNOSIS — E782 Mixed hyperlipidemia: Secondary | ICD-10-CM | POA: Diagnosis not present

## 2018-08-25 DIAGNOSIS — Z7189 Other specified counseling: Secondary | ICD-10-CM

## 2018-08-25 DIAGNOSIS — I10 Essential (primary) hypertension: Secondary | ICD-10-CM

## 2018-08-25 DIAGNOSIS — I25119 Atherosclerotic heart disease of native coronary artery with unspecified angina pectoris: Secondary | ICD-10-CM | POA: Diagnosis not present

## 2018-08-25 DIAGNOSIS — I255 Ischemic cardiomyopathy: Secondary | ICD-10-CM

## 2018-08-25 NOTE — Patient Instructions (Addendum)
Medication Instructions:   Your physician recommends that you continue on your current medications as directed. Please refer to the Current Medication list given to you today.  Labwork:  NONE  Testing/Procedures:  NONE  Follow-Up:  Your physician recommends that you schedule a follow-up appointment in: 3 months in the office with Domenic Polite.  Any Other Special Instructions Will Be Listed Below (If Applicable).  If you need a refill on your cardiac medications before your next appointment, please call your pharmacy.

## 2018-08-30 ENCOUNTER — Ambulatory Visit (HOSPITAL_COMMUNITY): Payer: Medicare HMO

## 2018-10-09 ENCOUNTER — Encounter (HOSPITAL_COMMUNITY): Payer: Self-pay

## 2018-10-09 ENCOUNTER — Other Ambulatory Visit: Payer: Self-pay

## 2018-10-09 ENCOUNTER — Emergency Department (HOSPITAL_COMMUNITY)
Admission: EM | Admit: 2018-10-09 | Discharge: 2018-10-10 | Disposition: A | Discharge status: Home / Self Care | Payer: Medicare HMO | Attending: Emergency Medicine | Admitting: Emergency Medicine

## 2018-10-09 ENCOUNTER — Emergency Department (HOSPITAL_COMMUNITY): Payer: Medicare HMO

## 2018-10-09 DIAGNOSIS — R918 Other nonspecific abnormal finding of lung field: Secondary | ICD-10-CM | POA: Diagnosis not present

## 2018-10-09 DIAGNOSIS — R55 Syncope and collapse: Secondary | ICD-10-CM | POA: Diagnosis not present

## 2018-10-09 DIAGNOSIS — I951 Orthostatic hypotension: Secondary | ICD-10-CM

## 2018-10-09 DIAGNOSIS — T675XXA Heat exhaustion, unspecified, initial encounter: Secondary | ICD-10-CM | POA: Diagnosis not present

## 2018-10-09 DIAGNOSIS — I10 Essential (primary) hypertension: Secondary | ICD-10-CM | POA: Insufficient documentation

## 2018-10-09 DIAGNOSIS — I2510 Atherosclerotic heart disease of native coronary artery without angina pectoris: Secondary | ICD-10-CM | POA: Diagnosis not present

## 2018-10-09 DIAGNOSIS — J449 Chronic obstructive pulmonary disease, unspecified: Secondary | ICD-10-CM | POA: Diagnosis not present

## 2018-10-09 LAB — CBC WITH DIFFERENTIAL/PLATELET
Abs Immature Granulocytes: 0.05 10*3/uL (ref 0.00–0.07)
Basophils Absolute: 0.1 10*3/uL (ref 0.0–0.1)
Basophils Relative: 1 %
Eosinophils Absolute: 0.1 10*3/uL (ref 0.0–0.5)
Eosinophils Relative: 1 %
HCT: 46.4 % (ref 39.0–52.0)
Hemoglobin: 15.9 g/dL (ref 13.0–17.0)
Immature Granulocytes: 1 %
Lymphocytes Relative: 10 %
Lymphs Abs: 1 10*3/uL (ref 0.7–4.0)
MCH: 33.3 pg (ref 26.0–34.0)
MCHC: 34.3 g/dL (ref 30.0–36.0)
MCV: 97.1 fL (ref 80.0–100.0)
Monocytes Absolute: 0.6 10*3/uL (ref 0.1–1.0)
Monocytes Relative: 6 %
Neutro Abs: 8.2 10*3/uL — ABNORMAL HIGH (ref 1.7–7.7)
Neutrophils Relative %: 81 %
Platelets: 208 10*3/uL (ref 150–400)
RBC: 4.78 MIL/uL (ref 4.22–5.81)
RDW: 12.6 % (ref 11.5–15.5)
WBC: 10 10*3/uL (ref 4.0–10.5)
nRBC: 0 % (ref 0.0–0.2)

## 2018-10-09 LAB — URINALYSIS, ROUTINE W REFLEX MICROSCOPIC
Bilirubin Urine: NEGATIVE
Glucose, UA: NEGATIVE mg/dL
Hgb urine dipstick: NEGATIVE
Ketones, ur: 5 mg/dL — AB
Leukocytes,Ua: NEGATIVE
Nitrite: NEGATIVE
Protein, ur: NEGATIVE mg/dL
Specific Gravity, Urine: 1.015 (ref 1.005–1.030)
pH: 6 (ref 5.0–8.0)

## 2018-10-09 LAB — COMPREHENSIVE METABOLIC PANEL
ALT: 14 U/L (ref 0–44)
AST: 17 U/L (ref 15–41)
Albumin: 4.1 g/dL (ref 3.5–5.0)
Alkaline Phosphatase: 48 U/L (ref 38–126)
Anion gap: 12 (ref 5–15)
BUN: 22 mg/dL (ref 8–23)
CO2: 23 mmol/L (ref 22–32)
Calcium: 8.9 mg/dL (ref 8.9–10.3)
Chloride: 104 mmol/L (ref 98–111)
Creatinine, Ser: 1.2 mg/dL (ref 0.61–1.24)
GFR calc Af Amer: >60 mL/min (ref >60)
GFR calc non Af Amer: 57 mL/min — ABNORMAL LOW (ref >60)
Glucose, Bld: 102 mg/dL — ABNORMAL HIGH (ref 70–99)
Potassium: 4.7 mmol/L (ref 3.5–5.1)
Sodium: 139 mmol/L (ref 135–145)
Total Bilirubin: 0.6 mg/dL (ref 0.3–1.2)
Total Protein: 7.2 g/dL (ref 6.5–8.1)

## 2018-10-09 LAB — CBG MONITORING, ED: Glucose-Capillary: 101 mg/dL — ABNORMAL HIGH (ref 70–99)

## 2018-10-09 LAB — TROPONIN I: Troponin I: <0.03 ng/mL (ref <0.03)

## 2018-10-09 MED ORDER — SODIUM CHLORIDE 0.9 % IV BOLUS
1000.0000 mL | Freq: Once | INTRAVENOUS | Status: AC
  Administered 2018-10-09: 1000 mL via INTRAVENOUS

## 2018-10-09 MED ORDER — SODIUM CHLORIDE 0.9 % IV SOLN: INTRAVENOUS | Status: DC

## 2018-10-09 NOTE — ED Provider Notes (Signed)
[facility] EMERGENCY DEPARTMENT Provider Note   CSN: [id number] Arrival date & time: 10/09/18  2152    History   Chief Complaint No chief complaint on file.   HPI [name] Graham is a 83 y.o. male.     Pt presents to the ED today with near-syncope.  The pt said he was outside all day doing yard work.  He did not drink any fluids.  He stood up after working and felt like he was going to pass out.  Pt said he felt better after going inside with the a/c, but family wanted him to be evaluated.  He was orthostatic for EMS.  Pt denies cp or sob.  No known sick exposures.       Past Medical History:  Diagnosis Date  . COPD (chronic obstructive pulmonary disease) ([place])   . Coronary atherosclerosis of native coronary artery    BMS circ and PTCA PDA 2007, DES RCA 10/09, DES LAD 2/12  . Essential hypertension   . GERD (gastroesophageal reflux disease)   . Hyperlipidemia   . NSTEMI (non-ST elevated myocardial infarction) [facility])    2007    Patient Active Problem List   Diagnosis Date Noted  . Solitary pulmonary nodule on lung CT 12/08/2016  . Upper airway cough syndrome 12/08/2016  . Lightheadedness 04/10/2016  . Chest pain 04/10/2016  . Atypical chest pain 04/10/2016  . Healthcare maintenance 05/16/2013  . Cigarette smoker 01/22/2010  . Essential hypertension, benign 01/15/2009  . CORONARY ATHEROSCLEROSIS NATIVE CORONARY ARTERY 01/15/2009  . Mixed hyperlipidemia 01/14/2009  . COPD  GOLD II/ still smoking  01/14/2009    Past Surgical History:  Procedure Laterality Date  . LIPOMA EXCISION Right         Home Medications    Prior to Admission medications   Medication Sig Start Date End Date Taking? Authorizing Provider  aspirin EC 81 MG tablet Take 1 tablet (81 mg total) by mouth daily. 01/12/18   Satira Sark, MD  clopidogrel (PLAVIX) 75 MG tablet Take 1 tablet (75 mg total) by mouth daily. 07/25/18   Satira Sark, MD  lisinopril (PRINIVIL,ZESTRIL) 40 MG  tablet Take 1 tablet (40 mg total) by mouth daily. 07/25/18 10/23/18  Satira Sark, MD  metoprolol succinate (TOPROL-XL) 25 MG 24 hr tablet TAKE 1/2 (ONE-HALF) TABLET BY MOUTH ONCE DAILY 07/25/18   Satira Sark, MD  nitroGLYCERIN (NITROSTAT) 0.4 MG SL tablet Place 1 tablet (0.4 mg total) under the tongue every 5 (five) minutes x 3 doses as needed for chest pain (if no relief after 3rd dose, proceed to the ED for an evaluation). 04/12/18   Satira Sark, MD  simvastatin (ZOCOR) 80 MG tablet Take 1 tablet (80 mg total) by mouth at bedtime. 07/25/18   Satira Sark, MD    Family History Family History  Problem Relation Age of Onset  . Cancer Father   . Heart attack Mother     Social History Social History   Tobacco Use  . Smoking status: Current Every Day Smoker    Packs/day: 1.00    Years: 63.00    Pack years: 63.00    Types: Cigarettes    Start date: 06/15/1953  . Smokeless tobacco: Never Used  . Tobacco comment: 1 pack a day  Substance Use Topics  . Alcohol use: No    Alcohol/week: 0.0 standard drinks  . Drug use: No     Allergies   Entresto [sacubitril-valsartan]   Review of  Systems Review of Systems  Neurological: Positive for syncope.  All other systems reviewed and are negative.    Physical Exam Updated Vital Signs Ht 5\' 10"  (1.778 m)   Wt 67.1 kg   BMI 21.24 kg/m   Physical Exam Vitals signs and nursing note reviewed.  Constitutional:      Appearance: Normal appearance.  HENT:     Head: Normocephalic and atraumatic.     Right Ear: External ear normal.     Left Ear: External ear normal.     Nose: Nose normal.     Mouth/Throat:     Mouth: Mucous membranes are dry.  Eyes:     Extraocular Movements: Extraocular movements intact.     Conjunctiva/sclera: Conjunctivae normal.     Pupils: Pupils are equal, round, and reactive to light.  Neck:     Musculoskeletal: Normal range of motion and neck supple.  Cardiovascular:     Rate and  Rhythm: Normal rate and regular rhythm.     Pulses: Normal pulses.     Heart sounds: Normal heart sounds.  Pulmonary:     Effort: Pulmonary effort is normal.     Breath sounds: Normal breath sounds.  Abdominal:     General: Abdomen is flat. Bowel sounds are normal.     Palpations: Abdomen is soft.  Musculoskeletal: Normal range of motion.  Skin:    General: Skin is warm.     Capillary Refill: Capillary refill takes less than 2 seconds.  Neurological:     General: No focal deficit present.     Mental Status: He is alert and oriented to person, place, and time.  Psychiatric:        Mood and Affect: Mood normal.        Behavior: Behavior normal.        Thought Content: Thought content normal.        Judgment: Judgment normal.      ED Treatments / Results  Labs (all labs ordered are listed, but only abnormal results are displayed) Labs Reviewed  CBG MONITORING, ED - Abnormal; Notable for the following components:      Result Value   Glucose-Capillary 101 (*)    All other components within normal limits  CBC WITH DIFFERENTIAL/PLATELET  COMPREHENSIVE METABOLIC PANEL  URINALYSIS, ROUTINE W REFLEX MICROSCOPIC  TROPONIN I    EKG EKG Interpretation  Date/Time:  Monday October 09 2018 22:18:54 EDT Ventricular Rate:  66 PR Interval:    QRS Duration: 117 QT Interval:  437 QTC Calculation: 458 R Axis:   -60 Text Interpretation:  Sinus rhythm LAD, consider left anterior fascicular block No significant change since last tracing Confirmed by Isla Pence [phone]) on 10/09/2018 10:21:03 PM   Radiology Dg Chest Port 1 View  Result Date: 10/09/2018 CLINICAL DATA:  Near syncope. EXAM: PORTABLE CHEST 1 VIEW COMPARISON:  04/10/2016. FINDINGS: Emphysematous changes are again noted bilaterally. There is no pneumothorax. No large pleural effusion. The cardiac size is stable from prior study. There is no acute osseous abnormality. No focal area of consolidation. There is a small rounded  density in the right mid lung zone. This may correspond to the nodule visualized on prior CT. IMPRESSION: 1. No acute cardiopulmonary process. 2. Severe emphysematous changes bilaterally. 3. Nodular density in the right mid lung zone may correspond to the patient's previously noted pulmonary nodules. If not already performed, the patient is due for a 12 month follow-up noncontrast CT of the chest. This should be performed in  a nonemergent outpatient setting. Electronically Signed   By: Constance Holster M.D.   On: 10/09/2018 22:50    Procedures Procedures (including critical care time)  Medications Ordered in ED Medications  sodium chloride 0.9 % bolus 1,000 mL (1,000 mLs Intravenous New Bag/Given 10/09/18 2250)    And  0.9 %  sodium chloride infusion (has no administration in time range)     Initial Impression / Assessment and Plan / ED Course  I have reviewed the triage vital signs and the nursing notes.  Pertinent labs & imaging results that were available during my care of the patient were reviewed by me and considered in my medical decision making (see chart for details).   Pt given IVFs.  He is already feeling better.    Labs are pending at shift change.  Pt signed out to Dr. Dina Rich.  Final Clinical Impressions(s) / ED Diagnoses   Final diagnoses:  Heat exhaustion, initial encounter  Orthostatic hypotension    ED Discharge Orders    None       Isla Pence, MD 10/09/18 2306

## 2018-10-09 NOTE — ED Triage Notes (Signed)
EMS reports pt was outside to yardwork and "felt like he overdid it". Pt got hot, was orthostatic per EMS- improved after IV fluids. BP dropped 15 systolic with standing. Pt denies pain right now. Pt says he is feeling back to normal.

## 2018-10-10 NOTE — ED Provider Notes (Signed)
12:10 AM  Patient signed out pending lab work.  Lab work-up reviewed and is largely reassuring.  He has 5 ketones in his urine but otherwise his metabolic panel is reassuring.  On recheck, he states he feels much better after hydration.  I discussed with him his chest x-ray findings including a nodular density.  Patient reports that he is followed by Dr. Melvyn Novas, pulmonology and is aware of his lung findings.  He is requesting discharge home.  He ambulated in the hallway without difficulty or dizziness.  After history, exam, and medical workup I feel the patient has been appropriately medically screened and is safe for discharge home. Pertinent diagnoses were discussed with the patient. Patient was given return precautions.    Merryl Hacker, MD 10/10/18 [phone]

## 2018-10-10 NOTE — Discharge Instructions (Addendum)
Make sure that you are staying well-hydrated.  Do not spend extended amount of times in the sun or heat.

## 2018-11-24 ENCOUNTER — Other Ambulatory Visit: Payer: Self-pay

## 2018-11-24 ENCOUNTER — Encounter: Payer: Self-pay | Admitting: Cardiology

## 2018-11-24 ENCOUNTER — Ambulatory Visit (INDEPENDENT_AMBULATORY_CARE_PROVIDER_SITE_OTHER): Payer: Medicare HMO | Admitting: Cardiology

## 2018-11-24 VITALS — BP 133/75 | HR 74 | Temp 97.1°F | Ht 70.0 in | Wt 139.0 lb

## 2018-11-24 DIAGNOSIS — I10 Essential (primary) hypertension: Secondary | ICD-10-CM | POA: Diagnosis not present

## 2018-11-24 DIAGNOSIS — F17200 Nicotine dependence, unspecified, uncomplicated: Secondary | ICD-10-CM | POA: Diagnosis not present

## 2018-11-24 DIAGNOSIS — I25119 Atherosclerotic heart disease of native coronary artery with unspecified angina pectoris: Secondary | ICD-10-CM

## 2018-11-24 DIAGNOSIS — E782 Mixed hyperlipidemia: Secondary | ICD-10-CM

## 2018-11-24 DIAGNOSIS — R69 Illness, unspecified: Secondary | ICD-10-CM | POA: Diagnosis not present

## 2018-11-24 NOTE — Progress Notes (Signed)
Cardiology Office Note  Date: 11/24/2018   ID: [name] Graham, DOB 1935-05-28, MRN [id number]  PCP:  Lysbeth Penner, FNP  Cardiologist:  Rozann Lesches, MD Electrophysiologist:  None   Chief Complaint  Patient presents with  . Coronary Artery Disease    History of Present Illness: [name] Graham is an 83 y.o. male last assessed via telehealth encounter in April.  He presents for a routine visit.  He is still staying at his [facility] up in [place] during the pandemic.  He comes down to check on his house and made and periodically.  He did have an episode of dehydration and heat exhaustion back in June when he was seen in the ER.  He states that the symptoms have resolved.  He does not report any angina or palpitations.  As noted previously, he did not tolerate switching from lisinopril to [place].  He is doing well on current regimen.  He reports NYHA class II dyspnea with most activities.  I reviewed his interval lab work and ECG from June.  Past Medical History:  Diagnosis Date  . COPD (chronic obstructive pulmonary disease) ([place])   . Coronary atherosclerosis of native coronary artery    BMS circ and PTCA PDA 2007, DES RCA 10/09, DES LAD 2/12  . Essential hypertension   . GERD (gastroesophageal reflux disease)   . Hyperlipidemia   . NSTEMI (non-ST elevated myocardial infarction) ([place])    2007    Past Surgical History:  Procedure Laterality Date  . LIPOMA EXCISION Right     Current Outpatient Medications  Medication Sig Dispense Refill  . aspirin EC 81 MG tablet Take 1 tablet (81 mg total) by mouth daily. 90 tablet 3  . clopidogrel (PLAVIX) 75 MG tablet Take 1 tablet (75 mg total) by mouth daily. 90 tablet 2  . lisinopril (PRINIVIL,ZESTRIL) 40 MG tablet Take 1 tablet (40 mg total) by mouth daily. 90 tablet 3  . metoprolol succinate (TOPROL-XL) 25 MG 24 hr tablet TAKE 1/2 (ONE-HALF) TABLET BY MOUTH ONCE DAILY 45 tablet 3  . nitroGLYCERIN (NITROSTAT) 0.4 MG  SL tablet Place 1 tablet (0.4 mg total) under the tongue every 5 (five) minutes x 3 doses as needed for chest pain (if no relief after 3rd dose, proceed to the ED for an evaluation). 25 tablet 3  . simvastatin (ZOCOR) 80 MG tablet Take 1 tablet (80 mg total) by mouth at bedtime. 90 tablet 3   No current facility-administered medications for this visit.    Allergies:  Entresto [sacubitril-valsartan]   Social History: The patient  reports that he has been smoking cigarettes. He started smoking about 65 years ago. He has a 31.50 pack-year smoking history. He has never used smokeless tobacco. He reports that he does not drink alcohol or use drugs.   ROS:  Please see the history of present illness. Otherwise, complete review of systems is positive for hearing loss.  All other systems are reviewed and negative.   Physical Exam: VS:  BP 133/75   Pulse 74   Temp (!) 97.1 F (36.2 C)   Ht 5\' 10"  (1.778 m)   Wt 139 lb (63 kg)   BMI 19.94 kg/m , BMI Body mass index is 19.94 kg/m.  Wt Readings from Last 3 Encounters:  11/24/18 139 lb (63 kg)  10/09/18 148 lb (67.1 kg)  08/25/18 148 lb (67.1 kg)    General: Elderly male, appears comfortable at rest. HEENT: Conjunctiva and lids normal, wearing  a mask. Neck: Supple, no elevated JVP or carotid bruits, no thyromegaly. Lungs: Diminished breath sounds, nonlabored breathing at rest. Cardiac: Regular rate and rhythm, no S3 or significant systolic murmur, no pericardial rub. Abdomen: Soft, nontender, no hepatomegaly, bowel sounds present, no guarding or rebound. Extremities: No pitting edema, distal pulses 2+. Skin: Warm and dry. Musculoskeletal: No kyphosis. Neuropsychiatric: Alert and oriented x3, affect grossly appropriate.  ECG:  An ECG dated 10/09/2018 was personally reviewed today and demonstrated:  Normal sinus rhythm with left anterior fascicular block.  Recent Labwork: 10/09/2018: ALT 14; AST 17; BUN 22; Creatinine, Ser 1.20; Hemoglobin 15.9;  Platelets 208; Potassium 4.7; Sodium 139     Component Value Date/Time   CHOL 138 05/09/2014 0734   CHOL 144 06/15/2013 0931   TRIG 92 05/09/2014 0734   HDL 55 05/09/2014 0734   HDL 61 06/15/2013 0931   CHOLHDL 2.5 05/09/2014 0734   VLDL 18 05/09/2014 0734   LDLCALC 65 05/09/2014 0734   LDLCALC 69 06/15/2013 0931    Other Studies Reviewed Today:  Lexiscan Myoview 06/02/2018:  Defect 1: There is a large defect of moderate severity present in the mid anteroseptal, mid inferoseptal, mid inferior, apical septal and apical inferior location.  Findings consistent with prior myocardial infarction with a mild degree of inferior/inferoseptal peri-infarct ischemia.  This is a high risk study primarily due to the combination of severe LV dysfunction and degree of myocardial scar/peri-infarct ischemia.  Nuclear stress EF: 35%.  LAFB and nonspecific IVCD seen throughout study with occasional PAC's and PVC's with stress.  Echocardiogram 06/07/2018: 1. The left ventricle has moderately reduced systolic function of 63-01%. The cavity size is mildly increased. There is mildly increased left ventricular wall thickness of the septal wall. Echo evidence of impaired diastolic relaxation. 2. There is akinesis of the apical inferior left ventricular segment. 3. The right ventricle has normal systolic function. The cavity in normal in size. There is no increase in right ventricular wall thickness. RVSP estimated 23 mmHg. 4. The aortic valve is tricuspid. There is mild calcification of the aortic valve. There is mild aortic annular calcification noted. 5. The mitral valve is normal in structure There is mild calcification. 6. The aortic root is normal in size and structure. 7. The pulmonic valve is grossly normal.  Assessment and Plan:  1.  CAD with history of multivessel PCI most recently status post DES to the LAD in 2012.  We continue medical therapy in the absence of progressive angina and at  his preference.  He does not report any escalating symptoms.  I reviewed his most recent ECG in June.  2.  Ischemic cardiomyopathy with LVEF approximately 35 to 40%.  He did not tolerate Entresto due to rash and is now back on lisinopril along with Toprol-XL.  Currently not requiring a diuretic.  He does not want to pursue ICD.  3.  Essential hypertension, blood pressure is adequately controlled today.  4.  Mixed hyperlipidemia, continues on Zocor.  Medication Adjustments/Labs and Tests Ordered: Current medicines are reviewed at length with the patient today.  Concerns regarding medicines are outlined above.   Tests Ordered: No orders of the defined types were placed in this encounter.   Medication Changes: No orders of the defined types were placed in this encounter.   Disposition:  Follow up 6 months in the [place] office.  Signed, Satira Sark, MD, [facility] 11/24/2018 9:57 AM    [facility] [place]. Main  [address] [place],  [address] Phone: [phone]; Fax: [phone]

## 2018-11-24 NOTE — Patient Instructions (Signed)
Medication Instructions: Your physician recommends that you continue on your current medications as directed. Please refer to the Current Medication list given to you today.   Labwork: None  Procedures/Testing: None  Follow-Up: 6 months with Dr.McDowell  Any Additional Special Instructions Will Be Listed Below (If Applicable).     If you need a refill on your cardiac medications before your next appointment, please call your pharmacy.      Thank you for choosing [facility] !        

## 2019-01-25 ENCOUNTER — Other Ambulatory Visit: Payer: Self-pay | Admitting: Cardiology

## 2019-03-22 DIAGNOSIS — R69 Illness, unspecified: Secondary | ICD-10-CM | POA: Diagnosis not present

## 2019-04-11 DIAGNOSIS — R69 Illness, unspecified: Secondary | ICD-10-CM | POA: Diagnosis not present

## 2019-04-11 DIAGNOSIS — I10 Essential (primary) hypertension: Secondary | ICD-10-CM | POA: Diagnosis not present

## 2019-04-11 DIAGNOSIS — E785 Hyperlipidemia, unspecified: Secondary | ICD-10-CM | POA: Diagnosis not present

## 2019-04-11 DIAGNOSIS — Z008 Encounter for other general examination: Secondary | ICD-10-CM | POA: Diagnosis not present

## 2019-04-11 DIAGNOSIS — Z955 Presence of coronary angioplasty implant and graft: Secondary | ICD-10-CM | POA: Diagnosis not present

## 2019-04-11 DIAGNOSIS — I252 Old myocardial infarction: Secondary | ICD-10-CM | POA: Diagnosis not present

## 2019-04-11 DIAGNOSIS — I25119 Atherosclerotic heart disease of native coronary artery with unspecified angina pectoris: Secondary | ICD-10-CM | POA: Diagnosis not present

## 2019-04-11 DIAGNOSIS — Z7982 Long term (current) use of aspirin: Secondary | ICD-10-CM | POA: Diagnosis not present

## 2019-04-11 DIAGNOSIS — I951 Orthostatic hypotension: Secondary | ICD-10-CM | POA: Diagnosis not present

## 2019-04-11 DIAGNOSIS — Z7902 Long term (current) use of antithrombotics/antiplatelets: Secondary | ICD-10-CM | POA: Diagnosis not present

## 2019-04-16 ENCOUNTER — Other Ambulatory Visit: Payer: Self-pay | Admitting: Cardiology

## 2019-05-28 ENCOUNTER — Other Ambulatory Visit: Payer: Self-pay | Admitting: Cardiology

## 2019-07-10 ENCOUNTER — Telehealth: Payer: Self-pay

## 2019-07-10 NOTE — Telephone Encounter (Signed)
Virtual Visit Pre-Appointment Phone Call  "(Name), I am calling you today to discuss your upcoming appointment. We are currently trying to limit exposure to the virus that causes COVID-19 by seeing patients at home rather than in the office."  1. "What is the BEST phone number to call the day of the visit?" - include this in appointment notes  2. "Do you have or have access to (through a family member/friend) a smartphone with video capability that we can use for your visit?" a. If yes - list this number in appt notes as "cell" (if different from BEST phone #) and list the appointment type as a VIDEO visit in appointment notes b. If no - list the appointment type as a PHONE visit in appointment notes  3. Confirm consent - "In the setting of the current Covid19 crisis, you are scheduled for a (phone or video) visit with your provider on (date) at (time).  Just as we do with many in-office visits, in order for you to participate in this visit, we must obtain consent.  If you'd like, I can send this to your mychart (if signed up) or email for you to review.  Otherwise, I can obtain your verbal consent now.  All virtual visits are billed to your insurance company just like a normal visit would be.  By agreeing to a virtual visit, we'd like you to understand that the technology does not allow for your provider to perform an examination, and thus may limit your provider's ability to fully assess your condition. If your provider identifies any concerns that need to be evaluated in person, we will make arrangements to do so.  Finally, though the technology is pretty good, we cannot assure that it will always work on either your or our end, and in the setting of a video visit, we may have to convert it to a phone-only visit.  In either situation, we cannot ensure that we have a secure connection.  Are you willing to proceed?" STAFF: Did the patient verbally acknowledge consent to telehealth visit? Document  YES/NO here:   4. Advise patient to be prepared - "Two hours prior to your appointment, go ahead and check your blood pressure, pulse, oxygen saturation, and your weight (if you have the equipment to check those) and write them all down. When your visit starts, your provider will ask you for this information. If you have an Apple Watch or Kardia device, please plan to have heart rate information ready on the day of your appointment. Please have a pen and paper handy nearby the day of the visit as well."  5. Give patient instructions for MyChart download to smartphone OR [email or web address] as below if video visit (depending on what platform provider is using)  6. Inform patient they will receive a phone call 15 minutes prior to their appointment time (may be from unknown caller ID) so they should be prepared to answer    TELEPHONE CALL NOTE  [name] Graham has been deemed a candidate for a follow-up tele-health visit to limit community exposure during the Covid-19 pandemic. I spoke with the patient via phone to ensure availability of phone/video source, confirm preferred email & phone number, and discuss instructions and expectations.  I reminded [name] Graham to be prepared with any vital sign and/or heart rhythm information that could potentially be obtained via home monitoring, at the time of his visit. I reminded [name] Graham to expect a phone call prior to  his visit.  [name] Graham 07/10/2019 2:35 PM   INSTRUCTIONS FOR DOWNLOADING THE MYCHART APP TO SMARTPHONE  - The patient must first make sure to have activated MyChart and know their login information - If Apple, go to App Store and type in MyChart in the search bar and download the app. If Android, ask patient to go to [employer] and type in [place] in the search bar and download the app. The app is free but as with any other app downloads, their phone may require them to verify saved payment information or  Apple/Android password.  - The patient will need to then log into the app with their MyChart username and password, and select [facility] as their healthcare provider to link the account. When it is time for your visit, go to the MyChart app, find appointments, and click Begin Video Visit. Be sure to Select Allow for your device to access the Microphone and Camera for your visit. You will then be connected, and your provider will be with you shortly.  **If they have any issues connecting, or need assistance please contact MyChart service desk [phone])**  **If using a computer, in order to ensure the best quality for their visit they will need to use either of the following Internet Browsers: [employer], or Google Chrome**  IF USING DOXIMITY or DOXY.ME - The patient will receive a link just prior to their visit by text.     FULL LENGTH CONSENT FOR TELE-HEALTH VISIT   I hereby voluntarily request, consent and authorize [facility] and its employed or contracted physicians, physician assistants, nurse practitioners or other licensed health care professionals (the Practitioner), to provide me with telemedicine health care services (the "Services") as deemed necessary by the treating Practitioner. I acknowledge and consent to receive the Services by the Practitioner via telemedicine. I understand that the telemedicine visit will involve communicating with the Practitioner through live audiovisual communication technology and the disclosure of certain medical information by electronic transmission. I acknowledge that I have been given the opportunity to request an in-person assessment or other available alternative prior to the telemedicine visit and am voluntarily participating in the telemedicine visit.  I understand that I have the right to withhold or withdraw my consent to the use of telemedicine in the course of my care at any time, without affecting my right to future care  or treatment, and that the Practitioner or I may terminate the telemedicine visit at any time. I understand that I have the right to inspect all information obtained and/or recorded in the course of the telemedicine visit and may receive copies of available information for a reasonable fee.  I understand that some of the potential risks of receiving the Services via telemedicine include:  [place] Kitchen Delay or interruption in medical evaluation due to technological equipment failure or disruption; . Information transmitted may not be sufficient (e.g. poor resolution of images) to allow for appropriate medical decision making by the Practitioner; and/or  . In rare instances, security protocols could fail, causing a breach of personal health information.  Furthermore, I acknowledge that it is my responsibility to provide information about my medical history, conditions and care that is complete and accurate to the best of my ability. I acknowledge that Practitioner's advice, recommendations, and/or decision may be based on factors not within their control, such as incomplete or inaccurate data provided by me or distortions of diagnostic images or specimens that may result from electronic transmissions. I understand  that the practice of medicine is not an exact science and that Practitioner makes no warranties or guarantees regarding treatment outcomes. I acknowledge that I will receive a copy of this consent concurrently upon execution via email to the email address I last provided but may also request a printed copy by calling the office of [facility].    I understand that my insurance will be billed for this visit.   I have read or had this consent read to me. . I understand the contents of this consent, which adequately explains the benefits and risks of the Services being provided via telemedicine.  . I have been provided ample opportunity to ask questions regarding this consent and the Services and have had  my questions answered to my satisfaction. . I give my informed consent for the services to be provided through the use of telemedicine in my medical care  By participating in this telemedicine visit I agree to the above.

## 2019-07-11 ENCOUNTER — Encounter: Payer: Self-pay | Admitting: Student

## 2019-07-11 ENCOUNTER — Telehealth (INDEPENDENT_AMBULATORY_CARE_PROVIDER_SITE_OTHER): Payer: Medicare HMO | Admitting: Student

## 2019-07-11 ENCOUNTER — Encounter: Payer: Self-pay | Admitting: Nurse Practitioner

## 2019-07-11 VITALS — Ht 70.0 in | Wt 140.0 lb

## 2019-07-11 DIAGNOSIS — I2510 Atherosclerotic heart disease of native coronary artery without angina pectoris: Secondary | ICD-10-CM

## 2019-07-11 DIAGNOSIS — I255 Ischemic cardiomyopathy: Secondary | ICD-10-CM

## 2019-07-11 DIAGNOSIS — F17200 Nicotine dependence, unspecified, uncomplicated: Secondary | ICD-10-CM

## 2019-07-11 DIAGNOSIS — E785 Hyperlipidemia, unspecified: Secondary | ICD-10-CM

## 2019-07-11 DIAGNOSIS — I10 Essential (primary) hypertension: Secondary | ICD-10-CM

## 2019-07-11 DIAGNOSIS — Z1211 Encounter for screening for malignant neoplasm of colon: Secondary | ICD-10-CM

## 2019-07-11 MED ORDER — CLOPIDOGREL BISULFATE 75 MG PO TABS: 75.0000 mg | ORAL_TABLET | Freq: Every day | ORAL | 3 refills | Status: DC

## 2019-07-11 MED ORDER — METOPROLOL SUCCINATE ER 25 MG PO TB24: ORAL_TABLET | ORAL | 3 refills | Status: DC

## 2019-07-11 NOTE — Progress Notes (Signed)
Virtual Visit via Telephone Note   This visit type was conducted due to national recommendations for restrictions regarding the COVID-19 Pandemic (e.g. social distancing) in an effort to limit this patient's exposure and mitigate transmission in our community.  Due to his co-morbid illnesses, this patient is at least at moderate risk for complications without adequate follow up.  This format is felt to be most appropriate for this patient at this time.  The patient did not have access to video technology/had technical difficulties with video requiring transitioning to audio format only (telephone).  All issues noted in this document were discussed and addressed.  No physical exam could be performed with this format.  Please refer to the patient's chart for his  consent to telehealth for [facility].   The patient was identified using 2 identifiers.  Date:  07/11/2019   ID:  [name] Graham, DOB [birth year], MRN [id number]  Patient Location: Home Provider Location: Office  PCP:  Lysbeth Penner, FNP  Cardiologist:  Rozann Lesches, MD  Electrophysiologist:  None   Evaluation Performed:  Follow-Up Visit  Chief Complaint: 6 month visit  History of Present Illness:    [name] Graham is a 84 y.o. male with past medical history of chronic combined systolic and diastolic CHF (EF 00-86% by echo in 06/2018), CAD (s/p BMS to LCx and PTCA to PDA in 2007, DES to RCA in 2009 and DES to LAD in 2012, NST in 05/2018 showing findings consistent with prior myocardial infarction with a mild degree of inferior/inferoseptal peri-infarct ischemia), HTN, HLD, COPD and tobacco use who presents for a 78-month follow-up telehealth visit.   He was last examined by Dr. Domenic Polite in 11/2018 and [place] at that time given the pandemic. He had been evaluated in the ED the previous month for dehydration and heat exhaustion but denied any recurrent symptoms. He had baseline  dyspnea on exertion but denied any chest pain or palpitations. He had previously wished to hold off on catheterization given no progressive angina and had been intolerant to [facility], therefore he was continued on his current medication regimen including ASA 81mg  daily, Plavix 75mg  daily, Lisinopril 40mg  daily, Toprol-XL 12.5mg  daily and Simvastatin 80mg  daily.      In talking with the patient today, he reports overall doing well since last visit. He spends time between his home here in [facility] and his [place] home in [place] which is approximately 2 hours away. He has overall been cautious given COVID-19 and is scheduled for second shot later this month.  He denies any recent chest pain or palpitations. He does have baseline dyspnea on exertion which has overall been stable and he denies any acute changes in this. He has not had to utilize SL NTG recently. No recent orthopnea, PND or lower extremity edema.  He does not have a blood pressure cuff at home but reports his daughter is in the healthcare field and checked this several weeks ago and it was ""normal".  He is no longer followed by a PCP and requests a GI referral as he is due for screening colonoscopy per his report. He denies any acute symptoms such as melena, hematochezia or changes in bowel habits.  The patient does not have symptoms concerning for COVID-19 infection (fever, chills, cough, or new shortness of breath).    Past Medical History:  Diagnosis Date  . COPD (chronic obstructive pulmonary disease) ([place])   . Coronary atherosclerosis of  native coronary artery    BMS circ and PTCA PDA 2007, DES RCA 10/09, DES LAD 2/12  . Essential hypertension   . GERD (gastroesophageal reflux disease)   . Hyperlipidemia   . NSTEMI (non-ST elevated myocardial infarction) ([place])    2007   Past Surgical History:  Procedure Laterality Date  . LIPOMA EXCISION Right      Current Meds  Medication Sig  . aspirin EC 81 MG tablet Take 1  tablet (81 mg total) by mouth daily.  . clopidogrel (PLAVIX) 75 MG tablet Take 1 tablet (75 mg total) by mouth daily.  Marland Kitchen lisinopril (ZESTRIL) 40 MG tablet Take 1 tablet by mouth once daily  . metoprolol succinate (TOPROL-XL) 25 MG 24 hr tablet TAKE 1/2 (ONE-HALF) TABLET BY MOUTH ONCE DAILY  . nitroGLYCERIN (NITROSTAT) 0.4 MG SL tablet DISSOLVE ONE TABLET UNDER THE TONGUE EVERY 5 MINUTES AS NEEDED FOR CHEST PAIN.  DO NOT EXCEED A TOTAL OF 3 DOSES IN 15 MINUTES  . simvastatin (ZOCOR) 80 MG tablet Take 1 tablet (80 mg total) by mouth at bedtime.  . [DISCONTINUED] clopidogrel (PLAVIX) 75 MG tablet Take 1 tablet by mouth once daily  . [DISCONTINUED] metoprolol succinate (TOPROL-XL) 25 MG 24 hr tablet TAKE 1/2 (ONE-HALF) TABLET BY MOUTH ONCE DAILY     Allergies:   Entresto [sacubitril-valsartan]   Social History   Tobacco Use  . Smoking status: Current Every Day Smoker    Packs/day: 0.50    Years: 63.00    Pack years: 31.50    Types: Cigarettes    Start date: 06/[birth year]/1953  . Smokeless tobacco: Never Used  . Tobacco comment: 1 pack a day  Substance Use Topics  . Alcohol use: No    Alcohol/week: 0.0 standard drinks  . Drug use: No     Family Hx: The patient's family history includes Cancer in his father; Heart attack in his mother.  ROS:   Please see the history of present illness.     All other systems reviewed and are negative.   Prior CV studies:   The following studies were reviewed today:  NST: 05/2018  Defect 1: There is a large defect of moderate severity present in the mid anteroseptal, mid inferoseptal, mid inferior, apical septal and apical inferior location.  Findings consistent with prior myocardial infarction with a mild degree of inferior/inferoseptal peri-infarct ischemia.  This is a high risk study primarily due to the combination of severe LV dysfunction and degree of myocardial scar/peri-infarct ischemia.  Nuclear stress EF: 35%.  LAFB and nonspecific IVCD  seen throughout study with occasional PAC's and PVC's with stress.   Echocardiogram: 06/2018 IMPRESSIONS    1. The left ventricle has moderately reduced systolic function of 16-10%.  The cavity size is mildly increased. There is mildly increased left  ventricular wall thickness of the septal wall. Echo evidence of impaired  diastolic relaxation.  2. There is akinesis of the apical inferior left ventricular segment.  3. The right ventricle has normal systolic function. The cavity in normal  in size. There is no increase in right ventricular wall thickness. RVSP  estimated 23 mmHg.  4. The aortic valve is tricuspid. There is mild calcification of the  aortic valve. There is mild aortic annular calcification noted.  5. The mitral valve is normal in structure There is mild calcification.  6. The aortic root is normal in size and structure.  7. The pulmonic valve is grossly normal.   Labs/Other Tests and Data Reviewed:  EKG:  An ECG dated 10/09/2018 was personally reviewed today and demonstrated:  NSR, HR 66 with LAD and LAFB.   Recent Labs: 10/09/2018: ALT 14; BUN 22; Creatinine, Ser 1.20; Hemoglobin 15.9; Platelets 208; Potassium 4.7; Sodium 139   Recent Lipid Panel Lab Results  Component Value Date/Time   CHOL 138 05/09/2014 07:34 AM   CHOL 144 06/15/2013 09:31 AM   TRIG 92 05/09/2014 07:34 AM   HDL 55 05/09/2014 07:34 AM   HDL 61 06/15/2013 09:31 AM   CHOLHDL 2.5 05/09/2014 07:34 AM   LDLCALC 65 05/09/2014 07:34 AM   LDLCALC 69 06/15/2013 09:31 AM    Wt Readings from Last 3 Encounters:  07/11/19 140 lb (63.5 kg)  11/24/18 139 lb (63 kg)  10/09/18 148 lb (67.1 kg)     Objective:    Vital Signs:  Ht 5\' 10"  (1.778 m)   Wt 140 lb (63.5 kg)   BMI 20.09 kg/m    General: Pleasant male sounding NAD Psych: Normal affect. Neuro: Alert and oriented X 3.  Lungs:  Resp regular and unlabored while talking on the phone.   ASSESSMENT & PLAN:    1. CAD - he is s/p BMS  to LCx and PTCA to PDA in 2007, DES to RCA in 2009 and DES to LAD in 2012, with NST in 05/2018 showing findings consistent with prior myocardial infarction with a mild degree of inferior/inferoseptal peri-infarct ischemia as outlined above. Given no recent progressive symptoms, he has preferred medical management. -Continue with ASA, Plavix, Toprol-XL and Simvastatin. He has not had recent lab work and given that he is on DAPT along with statin therapy, will check CBC, CMET and FLP.  2. Chronic Combined Systolic and Diastolic CHF/Ischemic Cardiomyopathy - EF 35-40% by most recent echocardiogram in 06/2018. He denies any recent orthopnea, PND or lower extremity edema. Weight has been stable on his home scales. He currently remains on Lisinopril 40 mg daily and Toprol-XL 12.5 mg daily. He does not have a blood pressure cuff and one has been requested from social work. If BP allows, would consider titration of Toprol-XL or the addition of Spironolactone in the future.  He was previously intolerant to [facility].  3. HTN - He is unable to report specific numbers but mentions this was checked by his daughter last week and "normal". - continue current regimen for now.   4. HLD - LDL was 65 in 2016 but more recent records are not available for review and he does not currently have a PCP. Will recheck FLP and LFT's.   5. Screening Colonoscopy - He reports being due for a repeat colonoscopy and does not have a PCP, therefore he requested a referral today. He wishes to be evaluated in [place], therefore a referral to [place] was entered. I have also asked nursing staff to provide contact information for [facility] so they can help him establish with a PCP.   6. Tobacco Use - he continues to smoke 1 ppd. Previously intolerant to Chantix.    COVID-19 Education: The signs and symptoms of COVID-19 were discussed with the patient and how to seek care for testing (follow up with PCP or arrange E-visit).  The  importance of social distancing was discussed today.  Time:   Today, I have spent 22 minutes with the patient with telehealth technology discussing the above problems.     Medication Adjustments/Labs and Tests Ordered: Current medicines are reviewed at length with the patient today.  Concerns regarding medicines are  outlined above.   Tests Ordered: Orders Placed This Encounter  Procedures  . CBC  . Comprehensive metabolic panel  . Lipid Profile  . Ambulatory referral to Gastroenterology    Medication Changes: Meds ordered this encounter  Medications  . metoprolol succinate (TOPROL-XL) 25 MG 24 hr tablet    Sig: TAKE 1/2 (ONE-HALF) TABLET BY MOUTH ONCE DAILY    Dispense:  45 tablet    Refill:  3  . clopidogrel (PLAVIX) 75 MG tablet    Sig: Take 1 tablet (75 mg total) by mouth daily.    Dispense:  90 tablet    Refill:  3    Order Specific Question:   Supervising Provider    Answer:   Dorothy Spark [[id number]]    Follow Up:  In Person in 6 month(s)  Signed, Erma Heritage, PA-C  07/11/2019 2:36 PM    [facility]

## 2019-07-11 NOTE — Patient Instructions (Signed)
Medication Instructions:  Your physician recommends that you continue on your current medications as directed. Please refer to the Current Medication list given to you today.  *If you need a refill on your cardiac medications before your next appointment, please call your pharmacy*   Lab Work: Fasting Lipid profile, CBC,CMET  If you have labs (blood work) drawn today and your tests are completely normal, you will receive your results only by: Marland Kitchen MyChart Message (if you have MyChart) OR . A paper copy in the mail If you have any lab test that is abnormal or we need to change your treatment, we will call you to review the results.   Testing/Procedures: A referral was placed to [facility].They will call you to schedule an apt.   Follow-Up: At [facility], you and your health needs are our priority.  As part of our continuing mission to provide you with exceptional heart care, we have created designated Provider Care Teams.  These Care Teams include your primary Cardiologist (physician) and Advanced Practice Providers (APPs -  Physician Assistants and Nurse Practitioners) who all work together to provide you with the care you need, when you need it.  We recommend signing up for the patient portal called "MyChart".  Sign up information is provided on this After Visit Summary.  MyChart is used to connect with patients for Virtual Visits (Telemedicine).  Patients are able to view lab/test results, encounter notes, upcoming appointments, etc.  Non-urgent messages can be sent to your provider as well.   To learn more about what you can do with MyChart, go to [email or web address].    Your next appointment:   6 month(s)  The format for your next appointment:   In Person  Provider:   Rozann Lesches, MD   Other Instructions  Local Primary Care Physicians include: Dr.Zack [place] Crane [phone],   [place] [phone], Dr.Fusco [phone]

## 2019-07-19 ENCOUNTER — Ambulatory Visit: Payer: Medicare HMO | Admitting: Nurse Practitioner

## 2019-07-19 NOTE — Progress Notes (Deleted)
07/19/2019 [name] Graham [id number] [birth year]   CHIEF COMPLAINT:   HISTORY OF PRESENT ILLNESS:  [name] Graham is an 84 year old male with a past medical history of hypertension, coronary artery disease, NSTEMI 2007, s/p stent LCx 2007, DES to RCA in 2009 and DES to LAD in 2012, ischemic cardiomyopathy, systolic and diastolic CHF with EF 76-73%, COPD and GERD. He presents today to schedule a screening colonoscopy.    He is followed by cardiologist Dr. Domenic Graham and [name] Pho PA-C. He remains on  ASA 81mg  daily, Plavix 75mg  daily, Lisinopril 40mg  daily, Toprol-XL 12.5mg  daily and Simvastatin 80mg  daily.           CBC Latest Ref Rng & Units 10/09/2018 04/10/2016 04/10/2016  WBC 4.0 - 10.5 K/uL 10.0 6.9 8.9  Hemoglobin 13.0 - 17.0 g/dL 15.9 16.0 15.8  Hematocrit 39.0 - 52.0 % 46.4 46.3 45.6  Platelets 150 - 400 K/uL 208 181 183   CMP Latest Ref Rng & Units 10/09/2018 06/20/2018 04/10/2016  Glucose 70 - 99 mg/dL 102(H) 95 -  BUN 8 - 23 mg/dL 22 24(H) -  Creatinine 0.61 - 1.24 mg/dL 1.20 0.96 0.88  Sodium 135 - 145 mmol/L 139 138 -  Potassium 3.5 - 5.1 mmol/L 4.7 4.2 -  Chloride 98 - 111 mmol/L 104 105 -  CO2 22 - 32 mmol/L 23 26 -  Calcium 8.9 - 10.3 mg/dL 8.9 9.1 -  Total Protein 6.5 - 8.1 g/dL 7.2 - -  Total Bilirubin 0.3 - 1.2 mg/dL 0.6 - -  Alkaline Phos 38 - 126 U/L 48 - -  AST 15 - 41 U/L 17 - -  ALT 0 - 44 U/L 14 - -     ECHO 06/07/2018: 1. The left ventricle has moderately reduced systolic function of 41-93%.  The cavity size is mildly increased. There is mildly increased left  ventricular wall thickness of the septal wall. Echo evidence of impaired  diastolic relaxation.  2. There is akinesis of the apical inferior left ventricular segment.  3. The right ventricle has normal systolic function. The cavity in normal  in size. There is no increase in right ventricular wall thickness. RVSP  estimated 23 mmHg.  4. The aortic valve is tricuspid. There is  mild calcification of the  aortic valve. There is mild aortic annular calcification noted.  5. The mitral valve is normal in structure There is mild calcification.  6. The aortic root is normal in size and structure.  7. The pulmonic valve is grossly normal.    Past Medical History:  Diagnosis Date  . COPD (chronic obstructive pulmonary disease) ([place])   . Coronary atherosclerosis of native coronary artery    BMS circ and PTCA PDA 2007, DES RCA 10/09, DES LAD 2/12  . Essential hypertension   . GERD (gastroesophageal reflux disease)   . Hyperlipidemia   . NSTEMI (non-ST elevated myocardial infarction) ([place])    2007   Past Surgical History:  Procedure Laterality Date  . LIPOMA EXCISION Right     reports that he has been smoking cigarettes. He started smoking about 66 years ago. He has a 31.50 pack-year smoking history. He has never used smokeless tobacco. He reports that he does not drink alcohol or use drugs. family history includes Cancer in his father; Heart attack in his mother. Allergies  Allergen Reactions  . Entresto [Sacubitril-Valsartan] Itching      Outpatient Encounter Medications as of 07/19/2019  Medication Sig  .  aspirin EC 81 MG tablet Take 1 tablet (81 mg total) by mouth daily.  . clopidogrel (PLAVIX) 75 MG tablet Take 1 tablet (75 mg total) by mouth daily.  Marland Kitchen lisinopril (ZESTRIL) 40 MG tablet Take 1 tablet by mouth once daily  . metoprolol succinate (TOPROL-XL) 25 MG 24 hr tablet TAKE 1/2 (ONE-HALF) TABLET BY MOUTH ONCE DAILY  . nitroGLYCERIN (NITROSTAT) 0.4 MG SL tablet DISSOLVE ONE TABLET UNDER THE TONGUE EVERY 5 MINUTES AS NEEDED FOR CHEST PAIN.  DO NOT EXCEED A TOTAL OF 3 DOSES IN 15 MINUTES  . simvastatin (ZOCOR) 80 MG tablet Take 1 tablet (80 mg total) by mouth at bedtime.   No facility-administered encounter medications on file as of 07/19/2019.     REVIEW OF SYSTEMS: All other systems reviewed and negative except where noted in the History of Present  Illness.   PHYSICAL EXAM: There were no vitals taken for this visit. General: Well developed ... in no acute distress. Head: Normocephalic and atraumatic. Eyes:  Sclerae non-icteric, conjunctive pink. Ears: Normal auditory acuity. Mouth: Dentition intact. No ulcers or lesions.  Neck: Supple, no lymphadenopathy or thyromegaly.  Lungs: Clear bilaterally to auscultation without wheezes, crackles or rhonchi. Heart: Regular rate and rhythm. No murmur, rub or gallop appreciated.  Abdomen: Soft, nontender, non distended. No masses. No hepatosplenomegaly. Normoactive bowel sounds x 4 quadrants.  Rectal:  Musculoskeletal: Symmetrical with no gross deformities. Skin: Warm and dry. No rash or lesions on visible extremities. Extremities: No edema. Neurological: Alert oriented x 4, no focal deficits.  Psychological:  Alert and cooperative. Normal mood and affect.  ASSESSMENT AND PLAN:    CC:  [name] Heritage, PA*

## 2019-07-31 ENCOUNTER — Ambulatory Visit (INDEPENDENT_AMBULATORY_CARE_PROVIDER_SITE_OTHER): Payer: Medicare HMO | Admitting: Nurse Practitioner

## 2019-07-31 ENCOUNTER — Encounter: Payer: Self-pay | Admitting: Nurse Practitioner

## 2019-07-31 VITALS — BP 150/82 | HR 79 | Temp 98.5°F | Ht 70.0 in | Wt 143.0 lb

## 2019-07-31 DIAGNOSIS — Z1211 Encounter for screening for malignant neoplasm of colon: Secondary | ICD-10-CM

## 2019-07-31 DIAGNOSIS — F17200 Nicotine dependence, unspecified, uncomplicated: Secondary | ICD-10-CM | POA: Diagnosis not present

## 2019-07-31 DIAGNOSIS — R69 Illness, unspecified: Secondary | ICD-10-CM | POA: Diagnosis not present

## 2019-07-31 NOTE — Progress Notes (Signed)
07/31/2019 [name] Graham [id number] [birth year]   CHIEF COMPLAINT: Schedule a colonoscopy   HISTORY OF PRESENT ILLNESS:  [name] Graham is an 84 year old male with a past medical history of hypertension, coronary artery disease, NSTEMI 2007, s/p PTCA 2007, DES RCA 2009 and DES LAD 2012 on Plavix, combined systolic an diastolic CHF with EF 02-54%, remote GERD and COPD. Smoker.  Past surgical history includes hernia repair and a lipoma excision from the right hip.  He received his Covid 19 vaccinations x 2.  He presents today for consideration for screening colonoscopy as referred by his cardiologist.  He reported having a colonoscopy 25 to 30 years ago which was normal.  He is now age 11 and he is concerned about his prostate and colon.  He wishes to schedule colonoscopy which would make him feel better knowing that everything was okay and that he did not have a colon cancer.  There is no family history of colon cancer.  He is passing normal formed brown bowel movement daily.  He denies having any rectal bleeding or melena.  No GERD symptoms.  He is on aspirin 81 mg daily and Plavix 75 mg daily due to having heart disease with 2 coronary stents.  He denies having any chest pain or shortness of breath.  He remains quite active.  His appetite is good and his weight is stable.  CBC Latest Ref Rng & Units 10/09/2018 04/10/2016 04/10/2016  WBC 4.0 - 10.5 K/uL 10.0 6.9 8.9  Hemoglobin 13.0 - 17.0 g/dL 15.9 16.0 15.8  Hematocrit 39.0 - 52.0 % 46.4 46.3 45.6  Platelets 150 - 400 K/uL 208 181 183    Past Medical History:  Diagnosis Date  . COPD (chronic obstructive pulmonary disease) ([place])   . Coronary atherosclerosis of native coronary artery    BMS circ and PTCA PDA 2007, DES RCA 10/09, DES LAD 2/12  . Essential hypertension   . GERD (gastroesophageal reflux disease)   . Hyperlipidemia   . NSTEMI (non-ST elevated myocardial infarction) ([place])    2007   Past Surgical History:  Procedure  Laterality Date  . LIPOMA EXCISION Right     reports that he has been smoking cigarettes. He started smoking about 66 years ago. He has a 31.50 pack-year smoking history. He has never used smokeless tobacco. He reports that he does not drink alcohol or use drugs.  Father with history of throat cancer, he was a smoker. family history includes Cancer in his father; Heart attack in his mother. Allergies  Allergen Reactions  . Entresto [Sacubitril-Valsartan] Itching      Outpatient Encounter Medications as of 07/31/2019  Medication Sig  . aspirin EC 81 MG tablet Take 1 tablet (81 mg total) by mouth daily.  . clopidogrel (PLAVIX) 75 MG tablet Take 1 tablet (75 mg total) by mouth daily.  Marland Kitchen lisinopril (ZESTRIL) 40 MG tablet Take 1 tablet by mouth once daily  . metoprolol succinate (TOPROL-XL) 25 MG 24 hr tablet TAKE 1/2 (ONE-HALF) TABLET BY MOUTH ONCE DAILY  . nitroGLYCERIN (NITROSTAT) 0.4 MG SL tablet DISSOLVE ONE TABLET UNDER THE TONGUE EVERY 5 MINUTES AS NEEDED FOR CHEST PAIN.  DO NOT EXCEED A TOTAL OF 3 DOSES IN 15 MINUTES  . simvastatin (ZOCOR) 80 MG tablet Take 1 tablet (80 mg total) by mouth at bedtime.   No facility-administered encounter medications on file as of 07/31/2019.     REVIEW OF SYSTEMS: All other systems reviewed and negative except  where noted in the History of Present Illness.   PHYSICAL EXAM: BP (!) 150/82   Pulse 79   Temp 98.5 F (36.9 C)   Ht 5\' 10"  (1.778 m)   Wt 143 lb (64.9 kg)   SpO2 97%   BMI 20.52 kg/m  General:  84 year old male in  no acute distress. Head: Normocephalic and atraumatic. Eyes:  Sclerae non-icteric, conjunctive pink. Ears: Normal auditory acuity. Mouth: Missing dentition. No ulcers or lesions.  Neck: Supple, no lymphadenopathy or thyromegaly.  Lungs: Clear bilaterally to auscultation without wheezes, crackles or rhonchi. Heart: Regular rate and rhythm. No murmur, rub or gallop appreciated.  Abdomen: Soft, nontender, non distended. No  masses. No hepatosplenomegaly. Normoactive bowel sounds x 4 quadrants.  Rectal: Deferred. Musculoskeletal: Symmetrical with no gross deformities. Skin: Warm and dry. No rash or lesions on visible extremities. Extremities: No edema. Neurological: Alert oriented x 4, no focal deficits.  Psychological:  Alert and cooperative. Normal mood and affect.  ASSESSMENT AND PLAN:  38. 84 year old male presents today to discuss colon cancer screening. -At the age of 50 with a significant history of coronary artery disease on Plavix and aspirin as well as CHF,  the overall benefits of a conventional colonoscopy do not outweigh the risk therefore I did not recommend a screening colonoscopy at this time.  However, the patient is concerned about his risk of colon cancer at the age of 58 and he understands the risks of a conventional colonoscopy therefore he elects to proceed with a Cologuard test.  I agreed to provide an order for the Cologuard test. -Further follow-up to be determined after the Cologuard results received -Patient to follow-up with his primary care physician or to establish a urologist for requested prostate evaluation  2. CAD, MI, DES x 2 on Plavix and CHF    CC:  [name] Graham, Orson Ape, FNP

## 2019-07-31 NOTE — Patient Instructions (Signed)
Your provider has ordered Cologuard testing as an option for colon cancer screening. This is performed by [employer] and may be out of network with your insurance. PRIOR to completing the test, it is YOUR responsibility to contact your insurance about covered benefits for this test. Your out of pocket expense could be anywhere from $0.00 to $649.00.   When you call to check coverage with your insurer, please provide the following information:   -The ONLY provider of Cologuard is [place] code for Cologuard is [phone].  [employer] NPI # [id number]  -[employer] Tax ID # [id number]   We have already sent your demographic and insurance information to [employer] (phone number [phone]) and they should contact you within the next week regarding your test. If you have not heard from them within the next week, please call our office at [phone].

## 2019-07-31 NOTE — Progress Notes (Signed)
Assessment and plan reviewed 

## 2019-08-15 ENCOUNTER — Telehealth: Payer: Self-pay | Admitting: Nurse Practitioner

## 2019-08-15 NOTE — Telephone Encounter (Signed)
Sharyn Lull from [employer] called stating that NPI for Jaclyn Shaggy is not correct on cologuard order. They need order with correct NPI #. She stated that you can use wite out for the correction. Fax is [phone].

## 2019-08-17 NOTE — Telephone Encounter (Signed)
refaxed cologuard form with correct NPI

## 2019-08-23 ENCOUNTER — Other Ambulatory Visit: Payer: Self-pay | Admitting: Cardiology

## 2019-09-19 DIAGNOSIS — H6010 Cellulitis of external ear, unspecified ear: Secondary | ICD-10-CM | POA: Diagnosis not present

## 2019-09-21 ENCOUNTER — Other Ambulatory Visit: Payer: Self-pay | Admitting: Cardiology

## 2019-10-02 DIAGNOSIS — R05 Cough: Secondary | ICD-10-CM | POA: Diagnosis not present

## 2019-10-02 DIAGNOSIS — R0989 Other specified symptoms and signs involving the circulatory and respiratory systems: Secondary | ICD-10-CM | POA: Diagnosis not present

## 2019-10-10 ENCOUNTER — Telehealth: Payer: Self-pay

## 2019-10-10 NOTE — Telephone Encounter (Signed)
I am sorry to hear that he has not been feeling well.  Please have him continue to update either urgent care or his PCP regarding cough if the [facility] are not helping.

## 2019-10-10 NOTE — Telephone Encounter (Signed)
Pt notified and thankful for the advice.

## 2019-10-10 NOTE — Telephone Encounter (Signed)
Per Pt he has had 2 Urgent Care visits [id number] in hip Pt would like to ask Dr.McDowell about medication prescribed before taking it.   Please call [phone]   Thanks renee

## 2019-10-10 NOTE — Telephone Encounter (Signed)
Pt called and states that he has had a cough for 1 1/2 week. He has a productive cough. States that he was seen at that urgent care and given tessalon perles. State that they do not help with the cough at this time. Pt encouraged to call urgent care and them on his sx. Please advise.

## 2019-10-22 DIAGNOSIS — L089 Local infection of the skin and subcutaneous tissue, unspecified: Secondary | ICD-10-CM | POA: Diagnosis not present

## 2019-10-22 DIAGNOSIS — E785 Hyperlipidemia, unspecified: Secondary | ICD-10-CM | POA: Diagnosis not present

## 2019-10-22 DIAGNOSIS — I2510 Atherosclerotic heart disease of native coronary artery without angina pectoris: Secondary | ICD-10-CM | POA: Diagnosis not present

## 2019-10-22 DIAGNOSIS — R69 Illness, unspecified: Secondary | ICD-10-CM | POA: Diagnosis not present

## 2019-10-22 DIAGNOSIS — K08109 Complete loss of teeth, unspecified cause, unspecified class: Secondary | ICD-10-CM | POA: Diagnosis not present

## 2019-10-22 DIAGNOSIS — Z7902 Long term (current) use of antithrombotics/antiplatelets: Secondary | ICD-10-CM | POA: Diagnosis not present

## 2019-10-22 DIAGNOSIS — K219 Gastro-esophageal reflux disease without esophagitis: Secondary | ICD-10-CM | POA: Diagnosis not present

## 2019-10-22 DIAGNOSIS — I10 Essential (primary) hypertension: Secondary | ICD-10-CM | POA: Diagnosis not present

## 2019-10-22 DIAGNOSIS — J439 Emphysema, unspecified: Secondary | ICD-10-CM | POA: Diagnosis not present

## 2019-10-22 DIAGNOSIS — I252 Old myocardial infarction: Secondary | ICD-10-CM | POA: Diagnosis not present

## 2019-11-29 ENCOUNTER — Telehealth: Payer: Self-pay | Admitting: Cardiology

## 2019-11-29 ENCOUNTER — Other Ambulatory Visit: Payer: Self-pay | Admitting: Cardiology

## 2019-11-29 MED ORDER — LISINOPRIL 40 MG PO TABS: 40.0000 mg | ORAL_TABLET | Freq: Every day | ORAL | 1 refills | Status: DC

## 2019-11-29 MED ORDER — METOPROLOL SUCCINATE ER 25 MG PO TB24: ORAL_TABLET | ORAL | 3 refills | Status: DC

## 2019-11-29 NOTE — Telephone Encounter (Signed)
New message     *STAT* If patient is at the pharmacy, call can be transferred to refill team.   1. Which medications need to be refilled? (please list name of each medication and dose if known)  lisinopril (ZESTRIL) 40 MG tablet Take 1 tablet by mouth once daily   metoprolol succinate (TOPROL-XL) 25 MG 24 hr tablet      2. Which pharmacy/location (including street and city if local pharmacy) is medication to be sent to? [facility] in [place]    3. Do they need a 30 day or 90 day supply?  [place]

## 2019-11-29 NOTE — Telephone Encounter (Signed)
Refill complete 

## 2019-11-30 DIAGNOSIS — W57XXXA Bitten or stung by nonvenomous insect and other nonvenomous arthropods, initial encounter: Secondary | ICD-10-CM | POA: Diagnosis not present

## 2019-11-30 DIAGNOSIS — R21 Rash and other nonspecific skin eruption: Secondary | ICD-10-CM | POA: Diagnosis not present

## 2019-11-30 DIAGNOSIS — S30861A Insect bite (nonvenomous) of abdominal wall, initial encounter: Secondary | ICD-10-CM | POA: Diagnosis not present

## 2019-12-31 DIAGNOSIS — R05 Cough: Secondary | ICD-10-CM | POA: Diagnosis not present

## 2019-12-31 DIAGNOSIS — J441 Chronic obstructive pulmonary disease with (acute) exacerbation: Secondary | ICD-10-CM | POA: Diagnosis not present

## 2019-12-31 DIAGNOSIS — R0981 Nasal congestion: Secondary | ICD-10-CM | POA: Diagnosis not present

## 2020-01-03 DIAGNOSIS — Z0000 Encounter for general adult medical examination without abnormal findings: Secondary | ICD-10-CM | POA: Diagnosis not present

## 2020-01-03 DIAGNOSIS — R531 Weakness: Secondary | ICD-10-CM | POA: Diagnosis not present

## 2020-01-03 DIAGNOSIS — R0602 Shortness of breath: Secondary | ICD-10-CM | POA: Diagnosis not present

## 2020-01-03 DIAGNOSIS — I10 Essential (primary) hypertension: Secondary | ICD-10-CM | POA: Diagnosis not present

## 2020-01-03 DIAGNOSIS — R0782 Intercostal pain: Secondary | ICD-10-CM | POA: Diagnosis not present

## 2020-01-03 DIAGNOSIS — J438 Other emphysema: Secondary | ICD-10-CM | POA: Diagnosis not present

## 2020-01-03 DIAGNOSIS — R634 Abnormal weight loss: Secondary | ICD-10-CM | POA: Diagnosis not present

## 2020-01-03 DIAGNOSIS — Z720 Tobacco use: Secondary | ICD-10-CM | POA: Insufficient documentation

## 2020-01-03 DIAGNOSIS — E782 Mixed hyperlipidemia: Secondary | ICD-10-CM | POA: Diagnosis not present

## 2020-01-03 DIAGNOSIS — R5382 Chronic fatigue, unspecified: Secondary | ICD-10-CM | POA: Diagnosis not present

## 2020-01-03 DIAGNOSIS — I2510 Atherosclerotic heart disease of native coronary artery without angina pectoris: Secondary | ICD-10-CM | POA: Diagnosis not present

## 2020-01-10 DIAGNOSIS — E782 Mixed hyperlipidemia: Secondary | ICD-10-CM | POA: Diagnosis not present

## 2020-01-10 DIAGNOSIS — J438 Other emphysema: Secondary | ICD-10-CM | POA: Diagnosis not present

## 2020-01-10 DIAGNOSIS — R0602 Shortness of breath: Secondary | ICD-10-CM | POA: Diagnosis not present

## 2020-01-10 DIAGNOSIS — R634 Abnormal weight loss: Secondary | ICD-10-CM | POA: Diagnosis not present

## 2020-01-10 DIAGNOSIS — I10 Essential (primary) hypertension: Secondary | ICD-10-CM | POA: Diagnosis not present

## 2020-01-10 DIAGNOSIS — Z720 Tobacco use: Secondary | ICD-10-CM | POA: Diagnosis not present

## 2020-01-10 DIAGNOSIS — R531 Weakness: Secondary | ICD-10-CM | POA: Diagnosis not present

## 2020-01-10 DIAGNOSIS — I2510 Atherosclerotic heart disease of native coronary artery without angina pectoris: Secondary | ICD-10-CM | POA: Diagnosis not present

## 2020-01-10 DIAGNOSIS — R0782 Intercostal pain: Secondary | ICD-10-CM | POA: Diagnosis not present

## 2020-01-14 DIAGNOSIS — E782 Mixed hyperlipidemia: Secondary | ICD-10-CM | POA: Diagnosis not present

## 2020-01-17 ENCOUNTER — Encounter: Payer: Self-pay | Admitting: Internal Medicine

## 2020-01-17 ENCOUNTER — Ambulatory Visit (INDEPENDENT_AMBULATORY_CARE_PROVIDER_SITE_OTHER): Payer: Medicare HMO | Admitting: Internal Medicine

## 2020-01-17 ENCOUNTER — Other Ambulatory Visit: Payer: Self-pay

## 2020-01-17 DIAGNOSIS — R05 Cough: Secondary | ICD-10-CM | POA: Diagnosis not present

## 2020-01-17 DIAGNOSIS — R058 Other specified cough: Secondary | ICD-10-CM

## 2020-01-17 DIAGNOSIS — I10 Essential (primary) hypertension: Secondary | ICD-10-CM | POA: Diagnosis not present

## 2020-01-17 DIAGNOSIS — R911 Solitary pulmonary nodule: Secondary | ICD-10-CM

## 2020-01-17 DIAGNOSIS — F17210 Nicotine dependence, cigarettes, uncomplicated: Secondary | ICD-10-CM

## 2020-01-17 DIAGNOSIS — J449 Chronic obstructive pulmonary disease, unspecified: Secondary | ICD-10-CM | POA: Diagnosis not present

## 2020-01-17 DIAGNOSIS — R69 Illness, unspecified: Secondary | ICD-10-CM | POA: Diagnosis not present

## 2020-01-17 MED ORDER — OLMESARTAN MEDOXOMIL 40 MG PO TABS: 40.0000 mg | ORAL_TABLET | Freq: Every day | ORAL | 11 refills | Status: DC

## 2020-01-17 NOTE — Patient Instructions (Addendum)
Stop zestoril (lisinopril ) and start olmesartan (benicar) 40 mg daily   We will call to schedule a PET at [facility] and discuss the results when available  The key is to stop smoking completely before smoking completely stops you!

## 2020-01-17 NOTE — Assessment & Plan Note (Signed)
Counseled re importance of smoking cessation but did not meet time criteria for separate billing     Medical decision making was a hihg level of complexity in this case because of  >  two chronic conditions /diagnoses requiring extra time for  H and P, chart review, counseling   and generating customized AVS unique to this office visit and charting.   Each maintenance medication was reviewed in detail including emphasizing most importantly the difference between maintenance and prns and under what circumstances the prns are to be triggered using an action plan format where appropriate. Please see avs for details which were reviewed in writing by both me and my nurse and patient given a written copy highlighted where appropriate with yellow highlighter for the patient's continued care at home along with an updated version of their medications.  Patient was asked to maintain medication reconciliation by comparing this list to the actual medications being used at home and to contact this office right away if there is a conflict or discrepancy.

## 2020-01-17 NOTE — Assessment & Plan Note (Signed)
CT neck 11/23/16 Chronic left ethmoid and left sphenoid sinusitis  - d/c acei 01/17/2020  (see separate a/p)

## 2020-01-17 NOTE — Assessment & Plan Note (Signed)
See CT neck 11/13/16 x 4 mm RUL  - Repeat 07/26/2017   No change RUL = perifissural nodule on LN > rec repeat in 12 m > placed in reminder file as he is moderate risk from smoking  - CT 01/10/20  LLL 2.4 cm spiculated nodule - nothing in RUL  - PET ordered  This is a different nodule than prior concern and is much more worrisome for lung ca so next step is pet and if early stage then consider segmentectomy if possible as not clear he could tolerate LLobectomy unless we find a significant improvement in symptoms off acei  (see separate a/p)

## 2020-01-17 NOTE — Progress Notes (Signed)
Subjective:    Patient ID: [name] Graham, male    DOB: 07-Sep-1935,   MRN: [id number]    Brief patient profile:  56 yowm active smoker very active with only cc daily cough worse at hs self referred for Abn CT chest done p [facility]  11/23/16.   History of Present Illness  12/08/2016 1st [facility] office visit/ Banks Chaikin   Chief Complaint  Patient presents with  . PULMONARY CONSULT    Self Referral - Review CT, showed spots on lungs.  doe = [name] = can walk nl pace, flat grade, can't hurry or go uphills or steps s sob   Cough indolent onset x years prior to OV  Present daily / worse at hs but s excess/ purulent mucus / no seasonal variation x years and minimal mucoid sputum production rec The key is to stop smoking completely before smoking completely stops you!  If cough worsens will need trial off lisinopril before considering adding  lung medications but I will defer this to your cardiologist  Please schedule a follow up visit in 3 months but call sooner if needed with PFTs  And CXR on return  Add:  Will schedule ct chest p return eval     03/23/2017  f/u ov/Diksha Tagliaferro re: copd [name] smokng / very rare saba  Chief Complaint  Patient presents with  . Follow-up    Breathing has improved. He c/o runny nose and watery eyes "I have a cold". He has minimal cough with clear sputum.   doe =  .[name] = can walk nl pace, flat grade, can't hurry or go uphills or steps s sob   Still on acei with "bad cold " x one week s purulent sputum rec Please schedule a follow up visit in 2 months but call sooner if needed with CT chest without contrast in am and PFT's with me in pm to complete the work up for pulmonary nodule and copd     07/26/2017  f/u ov/Shirleyann Montero re:  Copd GOLD II no maint rx / still smoking Chief Complaint  Patient presents with  . Follow-up    PFT's and CT Chest done today. Breathing is about the same.  He rarely uses his albuterol. He continues to smoke 1 ppd. He states waking up with  sweats for the past wk. He has occ cough with clear sputum.    Dyspnea:  [name] = can walk nl pace, flat grade, can't hurry or go uphills or steps s sob   Cough: minimal  Sleep: ok SABA use:  Rarely  rec F/u prn    01/17/2020  f/u ov/Nels Munn re:  Consult re lung nodule / worse doe s cough on ACEi  Chief Complaint  Patient presents with  . Follow-up    COPD, Lung mass on CT.  Dyspnea:  mb and back 150 some inclines variably stop Cough: none  Sleeping: bed is flat/ 2 pillows  SABA use: rarely once daily if over does it - never re-challenges  02: none    No obvious day to day or daytime variability or assoc excess/ purulent sputum or mucus plugs or hemoptysis or cp or chest tightness, subjective wheeze or overt sinus or hb symptoms.   Sleeping  without nocturnal  or early am exacerbation  of respiratory  c/o's or need for noct saba. Also denies any obvious fluctuation of symptoms with weather or environmental changes or other aggravating or alleviating factors except as outlined above   No unusual  exposure hx or h/o childhood pna/ asthma or knowledge of premature birth.  Current Allergies, Complete Past Medical History, Past Surgical History, Family History, and Social History were reviewed in [employer] record.  ROS  The following are not active complaints unless bolded Hoarseness, sore throat, dysphagia, dental problems, itching, sneezing,  nasal congestion or discharge of excess mucus or purulent secretions, ear ache,   fever, chills, sweats, unintended wt loss or wt gain, classically pleuritic or exertional cp,  orthopnea pnd or arm/hand swelling  or leg swelling, presyncope, palpitations, abdominal pain, anorexia better with megace, nausea, vomiting, diarrhea  or change in bowel habits or change in bladder habits, change in stools or change in urine, dysuria, hematuria,  rash, arthralgias, visual complaints, headache, numbness, weakness or ataxia or problems with  walking or coordination,  change in mood or  memory.        Current Meds  Medication Sig  . albuterol (VENTOLIN HFA) 108 (90 Base) MCG/ACT inhaler Inhale into the lungs.  Marland Kitchen aspirin EC 81 MG tablet Take 1 tablet (81 mg total) by mouth daily.  . clopidogrel (PLAVIX) 75 MG tablet Take 1 tablet (75 mg total) by mouth daily.  Marland Kitchen ipratropium (ATROVENT) 0.06 % nasal spray 2 sprays into each nostril Three (3) times a day.  . lisinopril (ZESTRIL) 40 MG tablet Take 1 tablet by mouth once daily  . lisinopril (ZESTRIL) 40 MG tablet Take 1 tablet (40 mg total) by mouth daily.  . megestrol (MEGACE) 40 MG/ML suspension Take by mouth.  . metoprolol succinate (TOPROL-XL) 25 MG 24 hr tablet TAKE 1/2 (ONE-HALF) TABLET BY MOUTH ONCE DAILY  . nitroGLYCERIN (NITROSTAT) 0.4 MG SL tablet DISSOLVE ONE TABLET UNDER THE TONGUE EVERY 5 MINUTES AS NEEDED FOR CHEST PAIN.  DO NOT EXCEED A TOTAL OF 3 DOSES IN 15 MINUTES  . simvastatin (ZOCOR) 80 MG tablet TAKE 1 TABLET BY MOUTH AT BEDTIME                   Objective:   Physical Exam   amb pleasant slt hoarse wm nad with prominent pseudowheeze   Vital signs reviewed  01/17/2020  - Note at rest 02 sats  98% on RA     01/17/2020        134 07/26/2017        144   03/23/2017     147   12/08/16 143 lb 6.4 oz (65 kg)  11/23/16 150 lb (68 kg)  10/13/16 145 lb (65.8 kg)     HEENT : pt wearing mask not removed for exam due to covid - 19 concerns.    NECK :  without JVD/Nodes/TM/ nl carotid upstrokes bilaterally   LUNGS: no acc muscle use,  Mild barrel  contour chest wall with bilateral  Distant bs with transmitted "wheeze" on insp from upper airway and  without cough on insp or exp maneuvers  and mild  Hyperresonant  to  percussion bilaterally     CV:  RRR  no s3 or murmur or increase in P2, and no edema   ABD:  soft and nontender with pos end  insp Hoover's  in the supine position. No bruits or organomegaly appreciated, bowel sounds nl  MS:   Nl gait/  ext  warm without deformities, calf tenderness, cyanosis or clubbing No obvious joint restrictions   SKIN: warm and dry without lesions    NEURO:  alert, approp, nl sensorium with  no motor or cerebellar deficits  apparent.          Assessment & Plan:

## 2020-01-17 NOTE — Assessment & Plan Note (Signed)
Active smoker Spirometry 03/23/2017  FEV1 1.68 (58%)  Ratio 60 with mild curvature s rx prior   - PFT's  07/26/2017  FEV1 1.97 (73 % ) ratio 62  p 13 % improvement from saba p nothing prior to study with DLCO  50 % corrects to 59 % for alv volume. - try off zestoril 01/17/2020   Best case scenario is that his fev1 is still near 2 liters in which case he could tolerate more definitive/ curative surgery vs excisional bx / radiation etc

## 2020-01-17 NOTE — Assessment & Plan Note (Signed)
D/c acei 01/17/2020 (variable sob with pseudowheeze on exam )  In the best review of chronic cough to date ( NEJM 2016 375 1544-1551) ,  ACEi are now felt to cause cough in up to  20% of pts which is a 4 fold increase from previous reports and does not include the variety of non-specific complaints we see in pulmonary clinic in pts on ACEi but previously attributed to another dx like  Copd/asthma and  include PNDS, throat and chest congestion, "bronchitis", unexplained dyspnea and noct "strangling" sensations, and hoarseness, but also  atypical /refractory GERD symptoms like dysphagia and "bad heartburn"   The only way I know  to prove this is not an "ACEi Case" is a trial off ACEi x a minimum of 6 weeks then regroup.   >>> try benicar 40 mg q day in place of lisinopril 40 mg

## 2020-01-24 NOTE — Progress Notes (Signed)
Cardiology Office Note  Date: 01/25/2020   ID: AARIK BLANK, DOB [birth year], MRN [id number]  PCP:  Lysbeth Penner, FNP  Cardiologist:  Rozann Lesches, MD Electrophysiologist:  None   Chief Complaint  Patient presents with  . Cardiac follow-up    History of Present Illness: [name] Graham is an 84 y.o. male telehealth encounter by Ms. Strader PA-C in March.  He presents for a routine visit.  In the interim he has lost weight with limited appetite, experiencing increasing dyspnea on exertion but not frank angina symptoms to require nitroglycerin.  He is in the process of further pulmonary work-up setting of COPD and pulmonary nodule.  PET scan is scheduled for next week.  I reviewed his recent lab work as outlined below.  We also went over his medications. I note that he was recently switched from lisinopril to Benicar by Dr. Melvyn Novas, note from September 16 reviewed.  Systolic is in the 892J today.  I personally reviewed his ECG which shows sinus rhythm with right bundle branch block and left anterior fascicular block.  Past Medical History:  Diagnosis Date  . Arthritis   . COPD (chronic obstructive pulmonary disease) ([place])   . Coronary atherosclerosis of native coronary artery    BMS circ and PTCA PDA 2007, DES RCA 10/09, DES LAD 2/12  . Essential hypertension   . GERD (gastroesophageal reflux disease)   . Heart attack ([place])    x 3 (latest 2009)  . Hyperlipidemia   . Kidney stones   . NSTEMI (non-ST elevated myocardial infarction) ([place])    2007    Past Surgical History:  Procedure Laterality Date  . ANGIOPLASTY     stent  . HERNIA REPAIR    . LIPOMA EXCISION Right    hip/buttox  . TONSILLECTOMY     as a child    Current Outpatient Medications  Medication Sig Dispense Refill  . albuterol (VENTOLIN HFA) 108 (90 Base) MCG/ACT inhaler Inhale into the lungs.    Marland Kitchen aspirin EC 81 MG tablet Take 1 tablet (81 mg total) by mouth daily. 90 tablet 3  . clopidogrel  (PLAVIX) 75 MG tablet Take 1 tablet (75 mg total) by mouth daily. 90 tablet 3  . ipratropium (ATROVENT) 0.06 % nasal spray 2 sprays into each nostril Three (3) times a day.    . megestrol (MEGACE) 40 MG/ML suspension Take by mouth.    . metoprolol succinate (TOPROL-XL) 25 MG 24 hr tablet TAKE 1/2 (ONE-HALF) TABLET BY MOUTH ONCE DAILY 45 tablet 3  . nitroGLYCERIN (NITROSTAT) 0.4 MG SL tablet DISSOLVE ONE TABLET UNDER THE TONGUE EVERY 5 MINUTES AS NEEDED FOR CHEST PAIN.  DO NOT EXCEED A TOTAL OF 3 DOSES IN 15 MINUTES 25 tablet 3  . olmesartan (BENICAR) 40 MG tablet Take 1 tablet (40 mg total) by mouth daily. 30 tablet 11  . simvastatin (ZOCOR) 80 MG tablet TAKE 1 TABLET BY MOUTH AT BEDTIME 90 tablet 3   No current facility-administered medications for this visit.   Allergies:  Entresto [sacubitril-valsartan]   ROS:  Hearing loss. Limited appetite.  Physical Exam: VS:  BP (!) 152/79   Pulse 76   Ht 5\' 10"  (1.778 m)   Wt 134 lb 6.4 oz (61 kg)   SpO2 99%   BMI 19.28 kg/m , BMI Body mass index is 19.28 kg/m.  Wt Readings from Last 3 Encounters:  01/25/20 134 lb 6.4 oz (61 kg)  01/17/20 134 lb (60.8 kg)  07/31/19 143 lb (64.9 kg)    General: Elderly male, appears comfortable at rest. HEENT: Conjunctiva and lids normal, no mask. Neck: Supple, no elevated JVP or carotid bruits, no thyromegaly. Lungs: Diminished breath sounds without wheezing. Cardiac: Regular rate and rhythm, no S3 or significant systolic murmur. Abdomen: Soft, bowel sounds present. Extremities: No pitting edema.  ECG:  An ECG dated 10/09/2018 was personally reviewed today and demonstrated:  Normal sinus rhythm with left anterior fascicular block.  Recent Labwork:  September 2021: BUN 24, creatinine 1.07, potassium 4.4, AST 26, ALT 17, hemoglobin 15.5, platelets 208, cholesterol 122, triglycerides 78, HDL 57, LDL 49 TSH 4.02  Other Studies Reviewed Today:  Lexiscan Myoview 06/02/2018:  Defect 1: There is a large  defect of moderate severity present in the mid anteroseptal, mid inferoseptal, mid inferior, apical septal and apical inferior location.  Findings consistent with prior myocardial infarction with a mild degree of inferior/inferoseptal peri-infarct ischemia.  This is a high risk study primarily due to the combination of severe LV dysfunction and degree of myocardial scar/peri-infarct ischemia.  Nuclear stress EF: 35%.  LAFB and nonspecific IVCD seen throughout study with occasional PAC's and PVC's with stress.  Echocardiogram 06/07/2018: 1. The left ventricle has moderately reduced systolic function of 95-63%. The cavity size is mildly increased. There is mildly increased left ventricular wall thickness of the septal wall. Echo evidence of impaired diastolic relaxation. 2. There is akinesis of the apical inferior left ventricular segment. 3. The right ventricle has normal systolic function. The cavity in normal in size. There is no increase in right ventricular wall thickness. RVSP estimated 23 mmHg. 4. The aortic valve is tricuspid. There is mild calcification of the aortic valve. There is mild aortic annular calcification noted. 5. The mitral valve is normal in structure There is mild calcification. 6. The aortic root is normal in size and structure. 7. The pulmonic valve is grossly normal.  Assessment and Plan:  1.  Multivessel CAD status post prior PCI, most recently DES to the LAD in 2012.  He has an associated ischemic cardiomyopathy with LVEF 35 to 40% and high risk Myoview done in follow-up in January of last year.  He has declined cardiac catheterization and does not want to pursue an ICD evaluation.  We continue medical therapy which now includes aspirin, Plavix, Toprol-XL, Benicar, and Zocor.  He does not report any accelerating angina at this time although I suspect at least some of his dyspnea on exertion is cardiac.  He does not appear fluid overloaded.  ECG reviewed.  2.   Essential hypertension, systolic in the 875I today.  He was recently switched from lisinopril to Benicar.  If blood pressure trend does not improve, may need to consider addition of of Norvasc although would have to switch his statin to a different preparation.  3.  COPD and pulmonary nodule with ongoing work-up per Dr. Melvyn Novas.  PET scan scheduled for next week.  He continues to smoke cigarettes but states that he is cutting back.  Medication Adjustments/Labs and Tests Ordered: Current medicines are reviewed at length with the patient today.  Concerns regarding medicines are outlined above.   Tests Ordered: Orders Placed This Encounter  Procedures  . EKG 12-Lead    Medication Changes: No orders of the defined types were placed in this encounter.   Disposition:  Follow up 6 months in the [place] office.  Signed, Satira Sark, MD, [facility] 01/25/2020 8:53 AM    [facility]  at [place]. [address], [place], [place] [address] Phone: [phone]; Fax: [phone]

## 2020-01-25 ENCOUNTER — Ambulatory Visit (INDEPENDENT_AMBULATORY_CARE_PROVIDER_SITE_OTHER): Payer: Medicare HMO | Admitting: Cardiology

## 2020-01-25 ENCOUNTER — Other Ambulatory Visit: Payer: Self-pay

## 2020-01-25 ENCOUNTER — Encounter: Payer: Self-pay | Admitting: Cardiology

## 2020-01-25 VITALS — BP 152/79 | HR 76 | Ht 70.0 in | Wt 134.4 lb

## 2020-01-25 DIAGNOSIS — I255 Ischemic cardiomyopathy: Secondary | ICD-10-CM

## 2020-01-25 DIAGNOSIS — I25119 Atherosclerotic heart disease of native coronary artery with unspecified angina pectoris: Secondary | ICD-10-CM

## 2020-01-25 NOTE — Patient Instructions (Signed)

## 2020-01-28 ENCOUNTER — Encounter (HOSPITAL_COMMUNITY)
Admission: RE | Admit: 2020-01-28 | Discharge: 2020-01-28 | Disposition: A | Discharge status: Home / Self Care | Payer: Medicare HMO | Source: Ambulatory Visit | Attending: Internal Medicine | Admitting: Internal Medicine

## 2020-01-28 ENCOUNTER — Other Ambulatory Visit: Payer: Self-pay

## 2020-01-28 DIAGNOSIS — R911 Solitary pulmonary nodule: Secondary | ICD-10-CM

## 2020-01-28 DIAGNOSIS — I714 Abdominal aortic aneurysm, without rupture: Secondary | ICD-10-CM | POA: Diagnosis not present

## 2020-01-28 DIAGNOSIS — I700 Atherosclerosis of aorta: Secondary | ICD-10-CM | POA: Diagnosis not present

## 2020-01-28 DIAGNOSIS — I2510 Atherosclerotic heart disease of native coronary artery without angina pectoris: Secondary | ICD-10-CM | POA: Diagnosis not present

## 2020-01-28 DIAGNOSIS — J439 Emphysema, unspecified: Secondary | ICD-10-CM | POA: Diagnosis not present

## 2020-01-28 MED ORDER — FLUDEOXYGLUCOSE F - 18 (FDG) INJECTION
6.5600 | Freq: Once | INTRAVENOUS | Status: AC | PRN
  Administered 2020-01-28: 6.56 via INTRAVENOUS

## 2020-01-31 ENCOUNTER — Telehealth: Payer: Self-pay | Admitting: Cardiology

## 2020-01-31 ENCOUNTER — Telehealth: Payer: Self-pay | Admitting: Internal Medicine

## 2020-01-31 NOTE — Telephone Encounter (Signed)
Pt would like to discuss visit he had with Dr.Wert.   Please call [phone]    thanks

## 2020-01-31 NOTE — Telephone Encounter (Signed)
Patient calling with question about referral to another lung doctor. Patient states you mentioned this at his office visit. Notes from 01/17/20 state, "This is a different nodule than prior concern and is much more worrisome for lung ca so next step is pet and if early stage then consider segmentectomy if possible as not clear he could tolerate LLobectomy unless we find a significant improvement in symptoms off acei  (see separate a/p."  Please advise

## 2020-01-31 NOTE — Telephone Encounter (Signed)
I spoke with the pt already and made him appt with Dr Lamonte Sakai and advised to bring daughter, Janett Billow

## 2020-01-31 NOTE — Telephone Encounter (Signed)
needs ov with one of our interventionalists who does navigational bronchoscopy/ bx >  set up next available when Daughter Janett Billow (nurse) can be present as he is a bit frail and doesn't have a good grasp of what this may entail

## 2020-01-31 NOTE — Telephone Encounter (Signed)
Spoke with pt who states he was trying to get an appt with Dr. Melvyn Novas. Pt states that his wife has since made appt on Oct 13.

## 2020-01-31 NOTE — Progress Notes (Signed)
Spoke with the pt and scheduled to see Dr Lamonte Sakai on 02/14/20 at 10 am and advised bring daughter and arrive 15 min prior to appt time. Pt verbalized understanding.

## 2020-02-01 ENCOUNTER — Telehealth: Payer: Self-pay | Admitting: Emergency Medicine

## 2020-02-01 ENCOUNTER — Ambulatory Visit (INDEPENDENT_AMBULATORY_CARE_PROVIDER_SITE_OTHER): Payer: Medicare HMO | Admitting: Emergency Medicine

## 2020-02-01 ENCOUNTER — Encounter: Payer: Self-pay | Admitting: Emergency Medicine

## 2020-02-01 ENCOUNTER — Other Ambulatory Visit: Payer: Self-pay

## 2020-02-01 VITALS — BP 142/70 | HR 79 | Temp 97.4°F | Ht 70.0 in | Wt 133.6 lb

## 2020-02-01 DIAGNOSIS — R911 Solitary pulmonary nodule: Secondary | ICD-10-CM

## 2020-02-01 DIAGNOSIS — J449 Chronic obstructive pulmonary disease, unspecified: Secondary | ICD-10-CM | POA: Diagnosis not present

## 2020-02-01 DIAGNOSIS — I2510 Atherosclerotic heart disease of native coronary artery without angina pectoris: Secondary | ICD-10-CM | POA: Diagnosis not present

## 2020-02-01 HISTORY — DX: Solitary pulmonary nodule: R91.1

## 2020-02-01 MED ORDER — SPIRIVA RESPIMAT 2.5 MCG/ACT IN AERS: 2.0000 | INHALATION_SPRAY | Freq: Every day | RESPIRATORY_TRACT | 0 refills | Status: DC

## 2020-02-01 NOTE — Assessment & Plan Note (Signed)
Follows with Dr. Domenic Polite.  Saw him at the end of last month.  Overall clinically stable.  He will need to come off Plavix for 5 days in order to have bronchoscopy.

## 2020-02-01 NOTE — H&P (View-Only) (Signed)
Subjective:    Patient ID: [name] Graham, male    DOB: 1935-05-04, 84 y.o.   MRN: [id number]  HPI 84 year old man, active smoker (32 pack years) with a history of CAD and MI, hypertension, hyperlipidemia, COPD.  He is followed by Dr. Melvyn Novas in our office for COPD and also for pulmonary nodular disease.  They have been following a small 4 mm right upper lobe pulmonary nodule.  Repeat CT done 01/10/2020 reviewed by me, shows a new spiculated 2.4 cm nodule in the left lower lobe.  This positive PET scan done 01/29/2020, reviewed by me, shows marked hypermetabolism of the infrahilar left lower lobe nodule concerning for malignancy.  There is focal hypermetabolism in left hilum suggestive of metastatic disease although difficult to see a CT correlate.  No PET evidence of distant disease.  Pulmonary function testing done 07/26/2017 reviewed by me, show moderate severe obstruction with a positive bronchodilator response, FEV1 1.74 L (64% predicted)  40 lbs of wt los over the last yr, progressive exertional SOB over the last year. He is on albuterol, uses about 2x a day.    Review of Systems As per HPI  Past Medical History:  Diagnosis Date  . Arthritis   . COPD (chronic obstructive pulmonary disease) ([place])   . Coronary atherosclerosis of native coronary artery    BMS circ and PTCA PDA 2007, DES RCA 10/09, DES LAD 2/12  . Essential hypertension   . GERD (gastroesophageal reflux disease)   . Heart attack ([place])    x 3 (latest 2009)  . Hyperlipidemia   . Kidney stones   . NSTEMI (non-ST elevated myocardial infarction) ([place])    2007     Family History  Problem Relation Age of Onset  . Throat cancer Father   . Heart attack Mother   . Alzheimer's disease Mother      Social History   Socioeconomic History  . Marital status: Divorced    Spouse name: Not on file  . Number of children: Not on file  . Years of education: Not on file  . Highest education level: Not on file  Occupational  History  . Occupation: Retired  Tobacco Use  . Smoking status: Current Every Day Smoker    Packs/day: 0.50    Years: 63.00    Pack years: 31.50    Types: Cigarettes    Start date: 06/15/1953  . Smokeless tobacco: Never Used  . Tobacco comment: 10 cigarettes daily 02/01/20 ARJ   Vaping Use  . Vaping Use: Never used  Substance and Sexual Activity  . Alcohol use: No    Alcohol/week: 0.0 standard drinks  . Drug use: No  . Sexual activity: Not on file  Other Topics Concern  . Not on file  Social History Narrative  . Not on file   Social Determinants of Health   Financial Resource Strain:   . Difficulty of Paying Living Expenses: Not on file  Food Insecurity:   . Worried About Charity fundraiser in the Last Year: Not on file  . Ran Out of Food in the Last Year: Not on file  Transportation Needs:   . Lack of Transportation (Medical): Not on file  . Lack of Transportation (Non-Medical): Not on file  Physical Activity:   . Days of Exercise per Week: Not on file  . Minutes of Exercise per Session: Not on file  Stress:   . Feeling of Stress : Not on file  Social Connections:   .  Frequency of Communication with Friends and Family: Not on file  . Frequency of Social Gatherings with Friends and Family: Not on file  . Attends Religious Services: Not on file  . Active Member of Clubs or Organizations: Not on file  . Attends Archivist Meetings: Not on file  . Marital Status: Not on file  Intimate Partner Violence:   . Fear of Current or Ex-Partner: Not on file  . Emotionally Abused: Not on file  . Physically Abused: Not on file  . Sexually Abused: Not on file     Allergies  Allergen Reactions  . Entresto [Sacubitril-Valsartan] Itching     Outpatient Medications Prior to Visit  Medication Sig Dispense Refill  . albuterol (VENTOLIN HFA) 108 (90 Base) MCG/ACT inhaler Inhale into the lungs.    Marland Kitchen aspirin EC 81 MG tablet Take 1 tablet (81 mg total) by mouth daily. 90  tablet 3  . clopidogrel (PLAVIX) 75 MG tablet Take 1 tablet (75 mg total) by mouth daily. 90 tablet 3  . ipratropium (ATROVENT) 0.06 % nasal spray 2 sprays into each nostril Three (3) times a day.    . megestrol (MEGACE) 40 MG/ML suspension Take by mouth.    . metoprolol succinate (TOPROL-XL) 25 MG 24 hr tablet TAKE 1/2 (ONE-HALF) TABLET BY MOUTH ONCE DAILY 45 tablet 3  . nitroGLYCERIN (NITROSTAT) 0.4 MG SL tablet DISSOLVE ONE TABLET UNDER THE TONGUE EVERY 5 MINUTES AS NEEDED FOR CHEST PAIN.  DO NOT EXCEED A TOTAL OF 3 DOSES IN 15 MINUTES 25 tablet 3  . olmesartan (BENICAR) 40 MG tablet Take 1 tablet (40 mg total) by mouth daily. 30 tablet 11  . simvastatin (ZOCOR) 80 MG tablet TAKE 1 TABLET BY MOUTH AT BEDTIME 90 tablet 3   No facility-administered medications prior to visit.        Objective:   Physical Exam  Vitals:   02/01/20 1436  BP: (!) 142/70  Pulse: 79  Temp: (!) 97.4 F (36.3 C)  TempSrc: Temporal  SpO2: 98%  Weight: 133 lb 9.6 oz (60.6 kg)  Height: 5\' 10"  (1.778 m)   Gen: Pleasant, thin elderly man, in no distress,  normal affect  ENT: No lesions,  mouth clear,  oropharynx clear, no postnasal drip  Neck: No JVD, no stridor  Lungs: No use of accessory muscles, distant, no wheeze or crackles.   Cardiovascular: RRR, heart sounds normal, no murmur or gallops, no peripheral edema  Musculoskeletal: No deformities, no cyanosis or clubbing  Neuro: alert, awake, non focal  Skin: Warm, no lesions or rash       Assessment & Plan:  COPD  GOLD II/ still smoking  Moderate obstruction by pulmonary function testing, not on maintenance medications.  He has had progressive exertional dyspnea.  Reasonable start Spiriva, continue albuterol as needed.  He needs a walking oximetry today.  If he desaturates then we will arrange for supplemental oxygen.  CORONARY ATHEROSCLEROSIS NATIVE CORONARY ARTERY Follows with Dr. Domenic Polite.  Saw him at the end of last month.  Overall  clinically stable.  He will need to come off Plavix for 5 days in order to have bronchoscopy.  Solitary pulmonary nodule on lung CT Isolated on PET scan, concerning for primary malignancy.  Is not a surgical candidate.  We talked about the pros and cons of navigational bronchoscopy today and he is okay with proceeding.  Risk and benefits discussed.  Will likely place fiducial markers.  If you get a diagnosis then he  would be a reasonable candidate for SBRT.  Baltazar Apo, MD, PhD 02/01/2020, 3:13 PM [facility] [phone] or if no answer [phone]

## 2020-02-01 NOTE — Addendum Note (Signed)
Addended by: Gavin Potters R on: 02/01/2020 04:06 PM   Modules accepted: Orders

## 2020-02-01 NOTE — Telephone Encounter (Signed)
Called and spoke with the pt's daughter  She states that the pt is wishing to reschedule with RB for another day besides 10/15 He has an opening today at 3 pm and I offered appt for then  She is going to call the pt and see if he wants to come in then  I will hold the spot and she will call back  Will hold in triage

## 2020-02-01 NOTE — Assessment & Plan Note (Signed)
Moderate obstruction by pulmonary function testing, not on maintenance medications.  He has had progressive exertional dyspnea.  Reasonable start Spiriva, continue albuterol as needed.  He needs a walking oximetry today.  If he desaturates then we will arrange for supplemental oxygen.

## 2020-02-01 NOTE — Assessment & Plan Note (Signed)
Isolated on PET scan, concerning for primary malignancy.  Is not a surgical candidate.  We talked about the pros and cons of navigational bronchoscopy today and he is okay with proceeding.  Risk and benefits discussed.  Will likely place fiducial markers.  If you get a diagnosis then he would be a reasonable candidate for SBRT.

## 2020-02-01 NOTE — Patient Instructions (Signed)
Walking oximetry today on room air.  If you qualify for oxygen then we will order this for you. Please start Spiriva 2 puffs once a day.  Take this every day at the same time as a maintenance medicine Keep your albuterol inhaler available to use when you need it.  You can use 2 puffs up to every 4 hours if you have shortness of breath, chest tightness, wheezing. We will arrange for a handicap placard. We will set up navigational bronchoscopy to further evaluate your left upper lobe lung mass. You will need to stop your Plavix 5 days prior to the scheduled procedure. Follow with [name] Graham in 1 month

## 2020-02-01 NOTE — Progress Notes (Signed)
Subjective:    Patient ID: [name] Graham, male    DOB: [birth year], 84 y.o.   MRN: [id number]  HPI 84 year old man, active smoker (32 pack years) with a history of CAD and MI, hypertension, hyperlipidemia, COPD.  He is followed by Dr. Melvyn Novas in our office for COPD and also for pulmonary nodular disease.  They have been following a small 4 mm right upper lobe pulmonary nodule.  Repeat CT done 01/10/2020 reviewed by me, shows a new spiculated 2.4 cm nodule in the left lower lobe.  This positive PET scan done 01/29/2020, reviewed by me, shows marked hypermetabolism of the infrahilar left lower lobe nodule concerning for malignancy.  There is focal hypermetabolism in left hilum suggestive of metastatic disease although difficult to see a CT correlate.  No PET evidence of distant disease.  Pulmonary function testing done 07/26/2017 reviewed by me, show moderate severe obstruction with a positive bronchodilator response, FEV1 1.74 L (64% predicted)  40 lbs of wt los over the last yr, progressive exertional SOB over the last year. He is on albuterol, uses about 2x a day.    Review of Systems As per HPI  Past Medical History:  Diagnosis Date   Arthritis    COPD (chronic obstructive pulmonary disease) ([place])    Coronary atherosclerosis of native coronary artery    BMS circ and PTCA PDA 2007, DES RCA 10/09, DES LAD 2/12   Essential hypertension    GERD (gastroesophageal reflux disease)    Heart attack ([place])    x 3 (latest 2009)   Hyperlipidemia    Kidney stones    NSTEMI (non-ST elevated myocardial infarction) ([place])    2007     Family History  Problem Relation Age of Onset   Throat cancer Father    Heart attack Mother    Alzheimer's disease Mother      Social History   Socioeconomic History   Marital status: Divorced    Spouse name: Not on file   Number of children: Not on file   Years of education: Not on file   Highest education level: Not on file  Occupational  History   Occupation: Retired  Tobacco Use   Smoking status: Current Every Day Smoker    Packs/day: 0.50    Years: 63.00    Pack years: 31.50    Types: Cigarettes    Start date: 06/15/1953   Smokeless tobacco: Never Used   Tobacco comment: 10 cigarettes daily 02/01/20 ARJ   Vaping Use   Vaping Use: Never used  Substance and Sexual Activity   Alcohol use: No    Alcohol/week: 0.0 standard drinks   Drug use: No   Sexual activity: Not on file  Other Topics Concern   Not on file  Social History Narrative   Not on file   Social Determinants of Health   Financial Resource Strain:    Difficulty of Paying Living Expenses: Not on file  Food Insecurity:    Worried About [place] in the Last Year: Not on file   [employer] of Food in the Last Year: Not on file  Transportation Needs:    Lack of Transportation (Medical): Not on file   Lack of Transportation (Non-Medical): Not on file  Physical Activity:    Days of Exercise per Week: Not on file   Minutes of Exercise per Session: Not on file  Stress:    Feeling of Stress : Not on file  Social Connections:  Frequency of Communication with Friends and Family: Not on file   Frequency of Social Gatherings with Friends and Family: Not on file   Attends Religious Services: Not on file   Active Member of Clubs or Organizations: Not on file   Attends Archivist Meetings: Not on file   Marital Status: Not on file  Intimate Partner Violence:    Fear of Current or Ex-Partner: Not on file   Emotionally Abused: Not on file   Physically Abused: Not on file   Sexually Abused: Not on file     Allergies  Allergen Reactions   Entresto [Sacubitril-Valsartan] Itching     Outpatient Medications Prior to Visit  Medication Sig Dispense Refill   albuterol (VENTOLIN HFA) 108 (90 Base) MCG/ACT inhaler Inhale into the lungs.     aspirin EC 81 MG tablet Take 1 tablet (81 mg total) by mouth daily. 90  tablet 3   clopidogrel (PLAVIX) 75 MG tablet Take 1 tablet (75 mg total) by mouth daily. 90 tablet 3   ipratropium (ATROVENT) 0.06 % nasal spray 2 sprays into each nostril Three (3) times a day.     megestrol (MEGACE) 40 MG/ML suspension Take by mouth.     metoprolol succinate (TOPROL-XL) 25 MG 24 hr tablet TAKE 1/2 (ONE-HALF) TABLET BY MOUTH ONCE DAILY 45 tablet 3   nitroGLYCERIN (NITROSTAT) 0.4 MG SL tablet DISSOLVE ONE TABLET UNDER THE TONGUE EVERY 5 MINUTES AS NEEDED FOR CHEST PAIN.  DO NOT EXCEED A TOTAL OF 3 DOSES IN 15 MINUTES 25 tablet 3   olmesartan (BENICAR) 40 MG tablet Take 1 tablet (40 mg total) by mouth daily. 30 tablet 11   simvastatin (ZOCOR) 80 MG tablet TAKE 1 TABLET BY MOUTH AT BEDTIME 90 tablet 3   No facility-administered medications prior to visit.        Objective:   Physical Exam  Vitals:   02/01/20 1436  BP: (!) 142/70  Pulse: 79  Temp: (!) 97.4 F (36.3 C)  TempSrc: Temporal  SpO2: 98%  Weight: 133 lb 9.6 oz (60.6 kg)  Height: 5\' 10"  (1.778 m)   Gen: Pleasant, thin elderly man, in no distress,  normal affect  ENT: No lesions,  mouth clear,  oropharynx clear, no postnasal drip  Neck: No JVD, no stridor  Lungs: No use of accessory muscles, distant, no wheeze or crackles.   Cardiovascular: RRR, heart sounds normal, no murmur or gallops, no peripheral edema  Musculoskeletal: No deformities, no cyanosis or clubbing  Neuro: alert, awake, non focal  Skin: Warm, no lesions or rash       Assessment & Plan:  COPD  GOLD II/ still smoking  Moderate obstruction by pulmonary function testing, not on maintenance medications.  He has had progressive exertional dyspnea.  Reasonable start Spiriva, continue albuterol as needed.  He needs a walking oximetry today.  If he desaturates then we will arrange for supplemental oxygen.  CORONARY ATHEROSCLEROSIS NATIVE CORONARY ARTERY Follows with Dr. Domenic Polite.  Saw him at the end of last month.  Overall  clinically stable.  He will need to come off Plavix for 5 days in order to have bronchoscopy.  Solitary pulmonary nodule on lung CT Isolated on PET scan, concerning for primary malignancy.  Is not a surgical candidate.  We talked about the pros and cons of navigational bronchoscopy today and he is okay with proceeding.  Risk and benefits discussed.  Will likely place fiducial markers.  If you get a diagnosis then he  would be a reasonable candidate for SBRT.  Baltazar Apo, MD, PhD 02/01/2020, 3:13 PM [facility] [phone] or if no answer [phone]

## 2020-02-04 ENCOUNTER — Telehealth: Payer: Self-pay | Admitting: Emergency Medicine

## 2020-02-04 DIAGNOSIS — R911 Solitary pulmonary nodule: Secondary | ICD-10-CM

## 2020-02-04 NOTE — Telephone Encounter (Signed)
[facility] CT Dept just called me back.  They no longer have pt on their scanner so not able to burn disk.  A new CT will need to be ordered.

## 2020-02-04 NOTE — Telephone Encounter (Signed)
OK to order super D CT without contrast  He needs to stop the ASA 2 days prior to procedure.

## 2020-02-04 NOTE — Telephone Encounter (Addendum)
Pt has been scheduled for ENB on 10/19 at 12:00 at [facility].  He will get covid test on 10/16 at 11:45. I have called [facility] & left vm for CT dept to call me back about making disk for [place]. He needs to get labs and states he would like to go to [facility] to get those.  Lauren - can you place order for labs to be done at [facility].  I also told pt RB states to be off Plavix for 5 days prior to biopsy.  He states he takes aspirin everyday too and would like a call back to let him know if he needs to stop that.

## 2020-02-04 NOTE — Telephone Encounter (Signed)
Labs ordered.

## 2020-02-04 NOTE — Telephone Encounter (Signed)
I spoke to [place] about pt's biopsy appt and asked about this phone message.  Pt's appt was rescheduled and nothing further needed on this note.

## 2020-02-05 DIAGNOSIS — J449 Chronic obstructive pulmonary disease, unspecified: Secondary | ICD-10-CM | POA: Diagnosis not present

## 2020-02-05 DIAGNOSIS — I2510 Atherosclerotic heart disease of native coronary artery without angina pectoris: Secondary | ICD-10-CM | POA: Diagnosis not present

## 2020-02-05 NOTE — Telephone Encounter (Signed)
I called pt & told him he need to hold his aspirin 2 days prior to procedure.  Pt states ok.

## 2020-02-05 NOTE — Telephone Encounter (Signed)
CT order has been placed and [facility] is working on [employer].  Nothing further needed for this note.

## 2020-02-05 NOTE — Telephone Encounter (Signed)
atc patient let him know about holding the ASA unable to reach told patient to call office back .   [facility] if you call for the CT time and date can you please share this with the patient.

## 2020-02-06 ENCOUNTER — Telehealth: Payer: Self-pay | Admitting: Emergency Medicine

## 2020-02-06 NOTE — Telephone Encounter (Signed)
Spoke to evicor [name] Graham has been approved [id number] 02/05/20-08/03/20 [name] Graham

## 2020-02-07 ENCOUNTER — Other Ambulatory Visit (HOSPITAL_COMMUNITY)
Admission: RE | Admit: 2020-02-07 | Discharge: 2020-02-07 | Disposition: A | Discharge status: Home / Self Care | Payer: Medicare HMO | Source: Ambulatory Visit | Attending: Emergency Medicine | Admitting: Emergency Medicine

## 2020-02-07 ENCOUNTER — Other Ambulatory Visit: Payer: Self-pay

## 2020-02-07 DIAGNOSIS — R911 Solitary pulmonary nodule: Secondary | ICD-10-CM | POA: Insufficient documentation

## 2020-02-07 DIAGNOSIS — I700 Atherosclerosis of aorta: Secondary | ICD-10-CM | POA: Diagnosis not present

## 2020-02-07 DIAGNOSIS — J439 Emphysema, unspecified: Secondary | ICD-10-CM | POA: Diagnosis not present

## 2020-02-07 DIAGNOSIS — Z01818 Encounter for other preprocedural examination: Secondary | ICD-10-CM | POA: Insufficient documentation

## 2020-02-07 DIAGNOSIS — I2510 Atherosclerotic heart disease of native coronary artery without angina pectoris: Secondary | ICD-10-CM | POA: Diagnosis not present

## 2020-02-07 LAB — CBC WITH DIFFERENTIAL/PLATELET
Abs Immature Granulocytes: 0.04 10*3/uL (ref 0.00–0.07)
Basophils Absolute: 0.1 10*3/uL (ref 0.0–0.1)
Basophils Relative: 1 %
Eosinophils Absolute: 0.2 10*3/uL (ref 0.0–0.5)
Eosinophils Relative: 3 %
HCT: 46 % (ref 39.0–52.0)
Hemoglobin: 15.6 g/dL (ref 13.0–17.0)
Immature Granulocytes: 1 %
Lymphocytes Relative: 24 %
Lymphs Abs: 1.8 10*3/uL (ref 0.7–4.0)
MCH: 33.1 pg (ref 26.0–34.0)
MCHC: 33.9 g/dL (ref 30.0–36.0)
MCV: 97.7 fL (ref 80.0–100.0)
Monocytes Absolute: 0.6 10*3/uL (ref 0.1–1.0)
Monocytes Relative: 8 %
Neutro Abs: 4.9 10*3/uL (ref 1.7–7.7)
Neutrophils Relative %: 63 %
Platelets: 256 10*3/uL (ref 150–400)
RBC: 4.71 MIL/uL (ref 4.22–5.81)
RDW: 13.4 % (ref 11.5–15.5)
WBC: 7.7 10*3/uL (ref 4.0–10.5)
nRBC: 0 % (ref 0.0–0.2)

## 2020-02-07 LAB — COMPREHENSIVE METABOLIC PANEL
ALT: 14 U/L (ref 0–44)
AST: 18 U/L (ref 15–41)
Albumin: 4 g/dL (ref 3.5–5.0)
Alkaline Phosphatase: 45 U/L (ref 38–126)
Anion gap: 8 (ref 5–15)
BUN: 21 mg/dL (ref 8–23)
CO2: 25 mmol/L (ref 22–32)
Calcium: 9.4 mg/dL (ref 8.9–10.3)
Chloride: 102 mmol/L (ref 98–111)
Creatinine, Ser: 1.08 mg/dL (ref 0.61–1.24)
GFR calc non Af Amer: >60 mL/min (ref >60)
Glucose, Bld: 96 mg/dL (ref 70–99)
Potassium: 4.5 mmol/L (ref 3.5–5.1)
Sodium: 135 mmol/L (ref 135–145)
Total Bilirubin: 0.6 mg/dL (ref 0.3–1.2)
Total Protein: 7.5 g/dL (ref 6.5–8.1)

## 2020-02-07 LAB — PROTIME-INR
INR: 1.1 (ref 0.8–1.2)
Prothrombin Time: 13.5 seconds (ref 11.4–15.2)

## 2020-02-07 LAB — APTT: aPTT: 29 seconds (ref 24–36)

## 2020-02-08 ENCOUNTER — Other Ambulatory Visit: Payer: Self-pay

## 2020-02-08 ENCOUNTER — Ambulatory Visit (INDEPENDENT_AMBULATORY_CARE_PROVIDER_SITE_OTHER)
Admission: RE | Admit: 2020-02-08 | Discharge: 2020-02-08 | Disposition: A | Discharge status: Home / Self Care | Payer: Medicare HMO | Source: Ambulatory Visit | Attending: Emergency Medicine | Admitting: Emergency Medicine

## 2020-02-08 DIAGNOSIS — J438 Other emphysema: Secondary | ICD-10-CM | POA: Diagnosis not present

## 2020-02-08 DIAGNOSIS — J984 Other disorders of lung: Secondary | ICD-10-CM | POA: Diagnosis not present

## 2020-02-08 DIAGNOSIS — I700 Atherosclerosis of aorta: Secondary | ICD-10-CM | POA: Diagnosis not present

## 2020-02-08 DIAGNOSIS — I2510 Atherosclerotic heart disease of native coronary artery without angina pectoris: Secondary | ICD-10-CM | POA: Diagnosis not present

## 2020-02-08 DIAGNOSIS — R911 Solitary pulmonary nodule: Secondary | ICD-10-CM | POA: Diagnosis not present

## 2020-02-14 ENCOUNTER — Ambulatory Visit: Payer: Medicare HMO | Admitting: Emergency Medicine

## 2020-02-15 DIAGNOSIS — H353120 Nonexudative age-related macular degeneration, left eye, stage unspecified: Secondary | ICD-10-CM | POA: Diagnosis not present

## 2020-02-15 DIAGNOSIS — H5200 Hypermetropia, unspecified eye: Secondary | ICD-10-CM | POA: Diagnosis not present

## 2020-02-15 DIAGNOSIS — H25813 Combined forms of age-related cataract, bilateral: Secondary | ICD-10-CM | POA: Diagnosis not present

## 2020-02-15 DIAGNOSIS — Z0100 Encounter for examination of eyes and vision without abnormal findings: Secondary | ICD-10-CM | POA: Diagnosis not present

## 2020-02-16 ENCOUNTER — Other Ambulatory Visit (HOSPITAL_COMMUNITY)
Admission: RE | Admit: 2020-02-16 | Discharge: 2020-02-16 | Disposition: A | Discharge status: Home / Self Care | Payer: Medicare HMO | Source: Ambulatory Visit | Attending: Emergency Medicine | Admitting: Emergency Medicine

## 2020-02-16 DIAGNOSIS — Z01812 Encounter for preprocedural laboratory examination: Secondary | ICD-10-CM | POA: Diagnosis not present

## 2020-02-16 DIAGNOSIS — Z20822 Contact with and (suspected) exposure to covid-19: Secondary | ICD-10-CM | POA: Diagnosis not present

## 2020-02-16 LAB — SARS CORONAVIRUS 2 (TAT 6-24 HRS): SARS Coronavirus 2: NEGATIVE

## 2020-02-18 ENCOUNTER — Other Ambulatory Visit: Payer: Self-pay

## 2020-02-18 ENCOUNTER — Encounter (HOSPITAL_COMMUNITY): Payer: Self-pay | Admitting: Emergency Medicine

## 2020-02-18 NOTE — Anesthesia Preprocedure Evaluation (Addendum)
Anesthesia Evaluation  Patient identified by MRN, date of birth, ID band Patient awake    Reviewed: Allergy & Precautions, H&P , NPO status , Patient's Chart, lab work & pertinent test results  Airway Mallampati: II   Neck ROM: full    Dental   Pulmonary shortness of breath, COPD, Current Smoker and Patient abstained from smoking.,    breath sounds clear to auscultation       Cardiovascular hypertension, + CAD, + Past MI and + Cardiac Stents   Rhythm:regular Rate:Normal     Neuro/Psych    GI/Hepatic GERD  ,  Endo/Other    Renal/GU      Musculoskeletal  (+) Arthritis ,   Abdominal   Peds  Hematology   Anesthesia Other Findings   Reproductive/Obstetrics                             Anesthesia Physical Anesthesia Plan  ASA: III  Anesthesia Plan: General   Post-op Pain Management:    Induction: Intravenous  PONV Risk Score and Plan: 1 and Ondansetron, Dexamethasone, Midazolam and Treatment may vary due to age or medical condition  Airway Management Planned: Oral ETT  Additional Equipment:   Intra-op Plan:   Post-operative Plan: Extubation in OR  Informed Consent: I have reviewed the patients History and Physical, chart, labs and discussed the procedure including the risks, benefits and alternatives for the proposed anesthesia with the patient or authorized representative who has indicated his/her understanding and acceptance.       Plan Discussed with: CRNA, Anesthesiologist and Surgeon  Anesthesia Plan Comments: (See PAT note written 02/18/2020 by Myra Gianotti, PA-C. )       Anesthesia Quick Evaluation

## 2020-02-18 NOTE — Progress Notes (Signed)
Patient denies fever, cough or chest pain.  PCP - Stevan Born, FNP Cardiologist - Dr Domenic Polite Pulmonology - Dr Melvyn Novas  Chest x-ray - n/a EKG - 01/25/20 Stress Test - 06/02/18 ECHO - 06/07/18 Cardiac Cath - n/a  Blood Thinner Instructions:  Plavix last dose was on 02/14/20  Aspirin Instructions:  ASA last dose was on 02/14/20  Anesthesia review: Yes  STOP now taking any Aspirin (unless otherwise instructed by your surgeon), Aleve, Naproxen, Ibuprofen, Motrin, Advil, Goody's, BC's, all herbal medications, fish oil, and all vitamins.   Coronavirus Screening Covid test on 02/16/20 was negative. Patient was unable to quarantine, will need to be retested on DOS.  Patient verbalized understanding of instructions that were given via phone.  Patient asked me to call Daughter Reymundo Poll [phone] to give her the instructions for DOS.

## 2020-02-18 NOTE — Progress Notes (Signed)
Anesthesia Chart Review: [name] Graham    Case: [id number] Date/Time: 02/19/20 1145   Procedure: VIDEO BRONCHOSCOPY WITH ENDOBRONCHIAL NAVIGATION (N/A )   Anesthesia type: General   Pre-op diagnosis: PULMONARY NODULE 1CM GREATER IN DIAMETER   Location: [facility]   Surgeons: Collene Gobble, MD      DISCUSSION: Patient is an 84 year old male scheduled for the above procedure. He has a solitary LLL pulmonary nodule on chest CT and hypermetabolic on PET scan, concerning for malignancy. According to Dr. Lamonte Sakai notes, Mr. Minasyan is not considered a surgical candidate. Tissue diagnosis is desired to guide management.   History includes smoking, CAD (NSTEMI 2007, BMS CX and PTCA PDA 2007; DES RCA 02/2008, DES LAD 06/2010), ischemic cardiomyopathy, COPD, GERD, HLD, HTN, hearing loss, dyspnea, LLL pulmonary nodule.   Last evaluation by cardiologist Dr. Domenic Polite was 01/25/2020 for routine follow-up. He notes patient was in the process of getting pulmonary nodule work-up. Patient with DOE but "no frank angina symptoms to require nitroglycerin." He had a known abnormal stress test from 05/2018 with findings consistent with prior MI with a mild degree of inferior/inferoseptal peri-infarct ischemic, EF 35%. (35-40% by echo). In the absence of progressive angina, he has been treated medically since then as patient has previously declined cardiac catheterization and did not want to pursue ICD evaluation. He did not appear fluid overloaded on exam. He was trying to cut back on smoking. Follow-up in 6 months planned.    Pulmonologist instructed patient to hold Plavix for 5 days and ASA for 2 days prior to brochoscopy.   02/16/2020 presurgical COVID-19 test negative.  Anesthesia team to evaluate on the day of surgery.   VS: Ht 5\' 10"  (1.778 m)    Wt 59.4 kg    BMI 18.80 kg/m  BP Readings from Last 3 Encounters:  02/01/20 (!) 142/70  01/25/20 (!) 152/79  01/17/20 130/72   Pulse Readings  from Last 3 Encounters:  02/01/20 79  01/25/20 76  01/17/20 86    PROVIDERS: Lysbeth Penner, FNP is PCP Rozann Lesches, MD is cardiologist Christinia Gully, MD is pulmonologist. Due to needing video bronchoscopy/ENB, he was last evaluated by Baltazar Apo, MD on 02/01/20.    LABS: Labs as of 02/07/20 include: Lab Results  Component Value Date   WBC 7.7 02/07/2020   HGB 15.6 02/07/2020   HCT 46.0 02/07/2020   PLT 256 02/07/2020   GLUCOSE 96 02/07/2020   ALT 14 02/07/2020   AST 18 02/07/2020   NA 135 02/07/2020   K 4.5 02/07/2020   CL 102 02/07/2020   CREATININE 1.08 02/07/2020   BUN 21 02/07/2020   CO2 25 02/07/2020   INR 1.1 02/07/2020    IMAGES: CT Super D Chest CT 02/08/20: IMPRESSION: 1. Stable 3 cm left lower lobe lung mass which was hypermetabolic on the PET-CT. 2. Stable borderline mediastinal and hilar lymph nodes some of which appear to be metabolically active on the PET-CT. 3. A few small scattered pulmonary nodules are stable and indeterminate. Recommend CT surveillance. 4. Stable emphysematous changes and pulmonary scarring. 5. Stable advanced three-vessel coronary artery calcifications. 6. No findings for upper abdominal metastatic disease. 7. Emphysema and aortic atherosclerosis.   PET Scan 01/28/20: IMPRESSION: - Markedly hypermetabolic infrahilar left lower lobe pulmonary nodule consistent with primary bronchogenic neoplasm. Focal hypermetabolism in the left hilum is concerning for metastatic disease although no discrete lymph node is evident on today's noncontrast CT imaging. Small precarinal and  right hilar lymph nodes show low level hypermetabolism and metastatic involvement cannot be excluded. - No evidence for distant hypermetabolic metastatic disease in the neck, abdomen, or pelvis. - Aortic Atherosclerois (ICD10-170.0) with 3.5 cm infrarenal abdominal aortic aneurysm. Recommend follow-up every 2 years. This recommendation follows ACR  consensus guidelines: White Paper of the ACR Incidental Findings Committee II on Vascular Findings. J Am Coll Radiol 2013; 10:789-794. - Emphysema. (GDJ24-Q68.9)   EKG: 01/25/20 ([facility]): Normal sinus rhythm Right bundle branch block Left anterior fascicular block Bifascicular block Septal infarct, age undetermined   CV: Lexiscan Myoview 06/02/2018:  Defect 1: There is a large defect of moderate severity present in the mid anteroseptal, mid inferoseptal, mid inferior, apical septal and apical inferior location.  Findings consistent with prior myocardial infarction with a mild degree of inferior/inferoseptal peri-infarct ischemia.  This is a high risk study primarily due to the combination of severe LV dysfunction and degree of myocardial scar/peri-infarct ischemia.  Nuclear stress EF: 35%.  LAFB and nonspecific IVCD seen throughout study with occasional PAC's and PVC's with stress.  Echocardiogram 06/07/2018: 1. The left ventricle has moderately reduced systolic function of 34-19%. The cavity size is mildly increased. There is mildly increased left ventricular wall thickness of the septal wall. Echo evidence of impaired diastolic relaxation. 2. There is akinesis of the apical inferior left ventricular segment. 3. The right ventricle has normal systolic function. The cavity in normal in size. There is no increase in right ventricular wall thickness. RVSP estimated 23 mmHg. 4. The aortic valve is tricuspid. There is mild calcification of the aortic valve. There is mild aortic annular calcification noted. 5. The mitral valve is normal in structure There is mild calcification. 6. The aortic root is normal in size and structure. 7. The pulmonic valve is grossly normal.   Past Medical History:  Diagnosis Date   Arthritis    COPD (chronic obstructive pulmonary disease) (HCC)    Coronary atherosclerosis of native coronary artery    BMS circ and PTCA PDA 2007, DES RCA  10/09, DES LAD 2/12   Dyspnea    PULMONARY NODULE    Essential hypertension    GERD (gastroesophageal reflux disease)    Hearing loss    no hearing aids   Heart attack ([place])    x 3 (latest 2009)   History of kidney stones    passed stones   Hyperlipidemia    NSTEMI (non-ST elevated myocardial infarction) ([place])    2007   Pulmonary nodule 1 cm or greater in diameter 02/2020   Dr Lamonte Sakai    Past Surgical History:  Procedure Laterality Date   ANGIOPLASTY     stent   COLONOSCOPY     HERNIA REPAIR     LIPOMA EXCISION Right    hip/buttox   TONSILLECTOMY     as a child    MEDICATIONS: No current facility-administered medications for this encounter.    albuterol (VENTOLIN HFA) 108 (90 Base) MCG/ACT inhaler   aspirin EC 81 MG tablet   clopidogrel (PLAVIX) 75 MG tablet   Ensure (ENSURE)   lisinopril (ZESTRIL) 40 MG tablet   metoprolol succinate (TOPROL-XL) 25 MG 24 hr tablet   Naphazoline HCl (CLEAR EYES OP)   nitroGLYCERIN (NITROSTAT) 0.4 MG SL tablet   olmesartan (BENICAR) 40 MG tablet   simvastatin (ZOCOR) 80 MG tablet   fluticasone (FLONASE) 50 MCG/ACT nasal spray   Tiotropium Bromide Monohydrate (SPIRIVA RESPIMAT) 2.5 MCG/ACT AERS    Myra Gianotti, PA-C Surgical Short Stay/Anesthesiology  [facility] Phone [phone] [facility] Phone [phone] 02/18/2020 4:27 PM

## 2020-02-19 ENCOUNTER — Ambulatory Visit (HOSPITAL_COMMUNITY): Payer: Medicare HMO | Admitting: Vascular Surgery

## 2020-02-19 ENCOUNTER — Ambulatory Visit (HOSPITAL_COMMUNITY): Payer: Medicare HMO

## 2020-02-19 ENCOUNTER — Encounter (HOSPITAL_COMMUNITY)
Admission: RE | Disposition: A | Discharge status: Home / Self Care | Payer: Self-pay | Source: Home / Self Care | Attending: Emergency Medicine

## 2020-02-19 ENCOUNTER — Encounter (HOSPITAL_COMMUNITY): Payer: Self-pay | Admitting: Emergency Medicine

## 2020-02-19 ENCOUNTER — Ambulatory Visit (HOSPITAL_COMMUNITY)
Admission: RE | Admit: 2020-02-19 | Discharge: 2020-02-19 | Disposition: A | Discharge status: Home / Self Care | Payer: Medicare HMO | Attending: Emergency Medicine | Admitting: Emergency Medicine

## 2020-02-19 ENCOUNTER — Telehealth: Payer: Self-pay | Admitting: Emergency Medicine

## 2020-02-19 DIAGNOSIS — I700 Atherosclerosis of aorta: Secondary | ICD-10-CM | POA: Diagnosis not present

## 2020-02-19 DIAGNOSIS — J439 Emphysema, unspecified: Secondary | ICD-10-CM | POA: Insufficient documentation

## 2020-02-19 DIAGNOSIS — K219 Gastro-esophageal reflux disease without esophagitis: Secondary | ICD-10-CM | POA: Insufficient documentation

## 2020-02-19 DIAGNOSIS — F17210 Nicotine dependence, cigarettes, uncomplicated: Secondary | ICD-10-CM | POA: Diagnosis not present

## 2020-02-19 DIAGNOSIS — J449 Chronic obstructive pulmonary disease, unspecified: Secondary | ICD-10-CM | POA: Diagnosis not present

## 2020-02-19 DIAGNOSIS — M1990 Unspecified osteoarthritis, unspecified site: Secondary | ICD-10-CM | POA: Insufficient documentation

## 2020-02-19 DIAGNOSIS — Z955 Presence of coronary angioplasty implant and graft: Secondary | ICD-10-CM | POA: Insufficient documentation

## 2020-02-19 DIAGNOSIS — Z9889 Other specified postprocedural states: Secondary | ICD-10-CM

## 2020-02-19 DIAGNOSIS — I714 Abdominal aortic aneurysm, without rupture: Secondary | ICD-10-CM | POA: Diagnosis not present

## 2020-02-19 DIAGNOSIS — C3432 Malignant neoplasm of lower lobe, left bronchus or lung: Secondary | ICD-10-CM | POA: Insufficient documentation

## 2020-02-19 DIAGNOSIS — Z20822 Contact with and (suspected) exposure to covid-19: Secondary | ICD-10-CM | POA: Insufficient documentation

## 2020-02-19 DIAGNOSIS — R911 Solitary pulmonary nodule: Secondary | ICD-10-CM

## 2020-02-19 DIAGNOSIS — I10 Essential (primary) hypertension: Secondary | ICD-10-CM | POA: Diagnosis not present

## 2020-02-19 DIAGNOSIS — C3492 Malignant neoplasm of unspecified part of left bronchus or lung: Secondary | ICD-10-CM | POA: Diagnosis present

## 2020-02-19 DIAGNOSIS — Z79899 Other long term (current) drug therapy: Secondary | ICD-10-CM | POA: Insufficient documentation

## 2020-02-19 DIAGNOSIS — Z7982 Long term (current) use of aspirin: Secondary | ICD-10-CM | POA: Insufficient documentation

## 2020-02-19 DIAGNOSIS — C3412 Malignant neoplasm of upper lobe, left bronchus or lung: Secondary | ICD-10-CM | POA: Diagnosis not present

## 2020-02-19 DIAGNOSIS — I2510 Atherosclerotic heart disease of native coronary artery without angina pectoris: Secondary | ICD-10-CM | POA: Diagnosis not present

## 2020-02-19 DIAGNOSIS — E785 Hyperlipidemia, unspecified: Secondary | ICD-10-CM | POA: Diagnosis not present

## 2020-02-19 DIAGNOSIS — I252 Old myocardial infarction: Secondary | ICD-10-CM | POA: Insufficient documentation

## 2020-02-19 DIAGNOSIS — R918 Other nonspecific abnormal finding of lung field: Secondary | ICD-10-CM | POA: Diagnosis present

## 2020-02-19 DIAGNOSIS — I255 Ischemic cardiomyopathy: Secondary | ICD-10-CM | POA: Insufficient documentation

## 2020-02-19 HISTORY — PX: BRONCHIAL NEEDLE ASPIRATION BIOPSY: SHX5106

## 2020-02-19 HISTORY — PX: FIDUCIAL MARKER PLACEMENT: SHX6858

## 2020-02-19 HISTORY — DX: Ischemic cardiomyopathy: I25.5

## 2020-02-19 HISTORY — PX: VIDEO BRONCHOSCOPY WITH ENDOBRONCHIAL NAVIGATION: SHX6175

## 2020-02-19 HISTORY — DX: Personal history of urinary calculi: Z87.442

## 2020-02-19 HISTORY — PX: BRONCHIAL BRUSHINGS: SHX5108

## 2020-02-19 HISTORY — PX: BRONCHIAL BIOPSY: SHX5109

## 2020-02-19 HISTORY — DX: Dyspnea, unspecified: R06.00

## 2020-02-19 HISTORY — DX: Unspecified hearing loss, unspecified ear: H91.90

## 2020-02-19 LAB — SARS CORONAVIRUS 2 BY RT PCR (HOSPITAL ORDER, PERFORMED IN ~~LOC~~ HOSPITAL LAB): SARS Coronavirus 2: NEGATIVE

## 2020-02-19 SURGERY — VIDEO BRONCHOSCOPY WITH ENDOBRONCHIAL NAVIGATION
Anesthesia: General

## 2020-02-19 MED ORDER — ONDANSETRON HCL 4 MG/2ML IJ SOLN: 4.0000 mg | Freq: Four times a day (QID) | INTRAMUSCULAR | Status: DC | PRN

## 2020-02-19 MED ORDER — FENTANYL CITRATE (PF) 100 MCG/2ML IJ SOLN
25.0000 ug | INTRAMUSCULAR | Status: DC | PRN
  Administered 2020-02-19: 25 ug via INTRAVENOUS

## 2020-02-19 MED ORDER — ROCURONIUM BROMIDE 10 MG/ML (PF) SYRINGE
PREFILLED_SYRINGE | INTRAVENOUS | Status: DC | PRN
  Administered 2020-02-19: 50 mg via INTRAVENOUS

## 2020-02-19 MED ORDER — OXYCODONE HCL 5 MG/5ML PO SOLN: 5.0000 mg | Freq: Once | ORAL | Status: DC | PRN

## 2020-02-19 MED ORDER — CLOPIDOGREL BISULFATE 75 MG PO TABS
75.0000 mg | ORAL_TABLET | Freq: Every day | ORAL | 3 refills | Status: DC
Start: 2020-02-19 — End: 2020-08-07

## 2020-02-19 MED ORDER — METOPROLOL SUCCINATE ER 25 MG PO TB24: 12.5000 mg | ORAL_TABLET | Freq: Every day | ORAL | Status: DC

## 2020-02-19 MED ORDER — SUGAMMADEX SODIUM 200 MG/2ML IV SOLN
INTRAVENOUS | Status: DC | PRN
  Administered 2020-02-19: 200 mg via INTRAVENOUS

## 2020-02-19 MED ORDER — NITROGLYCERIN 0.4 MG SL SUBL: 0.4000 mg | SUBLINGUAL_TABLET | SUBLINGUAL | Status: DC | PRN

## 2020-02-19 MED ORDER — LIDOCAINE 2% (20 MG/ML) 5 ML SYRINGE
INTRAMUSCULAR | Status: DC | PRN
  Administered 2020-02-19: 50 mg via INTRAVENOUS

## 2020-02-19 MED ORDER — PHENYLEPHRINE HCL-NACL 10-0.9 MG/250ML-% IV SOLN
INTRAVENOUS | Status: DC | PRN
  Administered 2020-02-19: 30 ug/min via INTRAVENOUS
  Administered 2020-02-19: 50 ug/min via INTRAVENOUS

## 2020-02-19 MED ORDER — ONDANSETRON HCL 4 MG/2ML IJ SOLN
INTRAMUSCULAR | Status: DC | PRN
  Administered 2020-02-19: 4 mg via INTRAVENOUS

## 2020-02-19 MED ORDER — EPHEDRINE SULFATE-NACL 50-0.9 MG/10ML-% IV SOSY
PREFILLED_SYRINGE | INTRAVENOUS | Status: DC | PRN
  Administered 2020-02-19: 10 mg via INTRAVENOUS

## 2020-02-19 MED ORDER — PHENYLEPHRINE 40 MCG/ML (10ML) SYRINGE FOR IV PUSH (FOR BLOOD PRESSURE SUPPORT)
PREFILLED_SYRINGE | INTRAVENOUS | Status: DC | PRN
  Administered 2020-02-19: 80 ug via INTRAVENOUS

## 2020-02-19 MED ORDER — FENTANYL CITRATE (PF) 250 MCG/5ML IJ SOLN
INTRAMUSCULAR | Status: DC | PRN
  Administered 2020-02-19: 50 ug via INTRAVENOUS

## 2020-02-19 MED ORDER — CHLORHEXIDINE GLUCONATE 0.12 % MT SOLN
OROMUCOSAL | Status: AC
  Filled 2020-02-19: qty 15

## 2020-02-19 MED ORDER — OXYCODONE HCL 5 MG PO TABS: 5.0000 mg | ORAL_TABLET | Freq: Once | ORAL | Status: DC | PRN

## 2020-02-19 MED ORDER — PROPOFOL 10 MG/ML IV BOLUS
INTRAVENOUS | Status: DC | PRN
  Administered 2020-02-19: 120 mg via INTRAVENOUS

## 2020-02-19 MED ORDER — SIMVASTATIN 80 MG PO TABS
80.0000 mg | ORAL_TABLET | Freq: Every day | ORAL | Status: DC
Start: 2020-02-19 — End: 2020-10-24

## 2020-02-19 MED ORDER — LACTATED RINGERS IV SOLN: INTRAVENOUS | Status: DC

## 2020-02-19 SURGICAL SUPPLY — 1 items: superlock fiducial marker ×6 IMPLANT

## 2020-02-19 NOTE — Telephone Encounter (Signed)
Called and spoke with daughter Janett Billow who states that patient had Bronch procedure today and has a headache. Patient was advised to hold Plavix till Thursday but is asking if he can take aspirin for headache. Advised her that I would ask because Aspirin is a blood thinner as well. She expressed understanding and is asking if since they are holding Plavix if they should hold Aspirin until Thursday.   Dr. Lamonte Sakai please advise on what he can take for headache and if he needs to hold Aspirin?

## 2020-02-19 NOTE — Transfer of Care (Signed)
Immediate Anesthesia Transfer of Care Note  Patient: [name] Graham  Procedure(s) Performed: VIDEO BRONCHOSCOPY WITH ENDOBRONCHIAL NAVIGATION (N/A ) BRONCHIAL BIOPSIES BRONCHIAL BRUSHINGS BRONCHIAL NEEDLE ASPIRATION BIOPSIES FIDUCIAL MARKER PLACEMENT  Patient Location: PACU  Anesthesia Type:General  Level of Consciousness: drowsy and patient cooperative  Airway & Oxygen Therapy: Patient Spontanous Breathing  Post-op Assessment: Report given to RN, Post -op Vital signs reviewed and stable and Patient moving all extremities X 4  Post vital signs: Reviewed and stable  Last Vitals:  Vitals Value Taken Time  BP 117/43 02/19/20 1254  Temp    Pulse 82 02/19/20 1257  Resp 27 02/19/20 1257  SpO2 100 % 02/19/20 1257  Vitals shown include unvalidated device data.  Last Pain:  Vitals:   02/19/20 1014  TempSrc:   PainSc: 0-No pain      Patients Stated Pain Goal: 2 (57/50/51 8335)  Complications: No complications documented.

## 2020-02-19 NOTE — Discharge Instructions (Signed)
Flexible Bronchoscopy, Care After This sheet gives you information about how to care for yourself after your test. Your doctor may also give you more specific instructions. If you have problems or questions, contact your doctor. Follow these instructions at home: Eating and drinking  Do not eat or drink anything (not even water) for 2 hours after your test, or until your numbing medicine (local anesthetic) wears off.  When your numbness is gone and your cough and gag reflexes have come back, you may: ? Eat only soft foods. ? Slowly drink liquids.  The day after the test, go back to your normal diet. Driving  Do not drive for 24 hours if you were given a medicine to help you relax (sedative).  Do not drive or use heavy machinery while taking prescription pain medicine. General instructions   Take over-the-counter and prescription medicines only as told by your doctor.  Return to your normal activities as told. Ask what activities are safe for you.  Do not use any products that have nicotine or tobacco in them. This includes cigarettes and e-cigarettes. If you need help quitting, ask your doctor.  Keep all follow-up visits as told by your doctor. This is important. It is very important if you had a tissue sample (biopsy) taken. Get help right away if:  You have shortness of breath that gets worse.  You get light-headed.  You feel like you are going to pass out (faint).  You have chest pain.  You cough up: ? More than a little blood. ? More blood than before. Summary  Do not eat or drink anything (not even water) for 2 hours after your test, or until your numbing medicine wears off.  Do not use cigarettes. Do not use e-cigarettes.  Get help right away if you have chest pain.  You may restart your Plavix on 02/21/2020.  Please call our office for any questions or concerns.  [phone].   This information is not intended to replace advice given to you by your health  care provider. Make sure you discuss any questions you have with your health care provider. Document Revised: 04/01/2017 Document Reviewed: 05/07/2016 Elsevier Patient Education  2020 [employer].

## 2020-02-19 NOTE — Telephone Encounter (Signed)
Tell him it is ok for him to take aspirin for the HA. If he wants to try tylenol first, that would probably be even better.

## 2020-02-19 NOTE — Anesthesia Postprocedure Evaluation (Signed)
Anesthesia Post Note  Patient: [name] Graham  Procedure(s) Performed: VIDEO BRONCHOSCOPY WITH ENDOBRONCHIAL NAVIGATION (N/A ) BRONCHIAL BIOPSIES BRONCHIAL BRUSHINGS BRONCHIAL NEEDLE ASPIRATION BIOPSIES FIDUCIAL MARKER PLACEMENT     Patient location during evaluation: PACU Anesthesia Type: General Level of consciousness: awake Pain management: pain level controlled Vital Signs Assessment: post-procedure vital signs reviewed and stable Respiratory status: spontaneous breathing Cardiovascular status: stable Postop Assessment: no apparent nausea or vomiting Anesthetic complications: no   No complications documented.  Last Vitals:  Vitals:   02/19/20 1315 02/19/20 1330  BP: (!) 120/94 122/89  Pulse: 81 72  Resp: (!) 21 20  Temp:  36.6 C  SpO2: 95% 98%    Last Pain:  Vitals:   02/19/20 1330  TempSrc:   PainSc: 0-No pain                 Jamiesha Victoria

## 2020-02-19 NOTE — Interval H&P Note (Signed)
History and Physical Interval Note:  02/19/2020 9:38 AM  [name] Graham  has presented today for surgery, with the diagnosis of PULMONARY NODULE 1CM GREATER IN DIAMETER.  The various methods of treatment have been discussed with the patient and family. After consideration of risks, benefits and other options for treatment, the patient has consented to  Procedure(s): [place] (N/A) as a surgical intervention.  The patient's history has been reviewed, patient examined, no change in status, stable for surgery.  I have reviewed the patient's chart and labs.  Questions were answered to the patient's satisfaction.    I am waiting for his repeat COVID swab to return. When resulted, confirmed negative, OK to proceed.    Collene Gobble

## 2020-02-19 NOTE — Anesthesia Procedure Notes (Signed)
Procedure Name: Intubation Date/Time: 02/19/2020 11:57 AM Performed by: Darletta Moll, CRNA Pre-anesthesia Checklist: Patient identified, Emergency Drugs available, Suction available and Patient being monitored Patient Re-evaluated:Patient Re-evaluated prior to induction Oxygen Delivery Method: Circle system utilized Preoxygenation: Pre-oxygenation with 100% oxygen Induction Type: IV induction Ventilation: Mask ventilation without difficulty Laryngoscope Size: Mac and 4 Grade View: Grade I Tube type: Oral Tube size: 8.5 mm Number of attempts: 1 Airway Equipment and Method: Stylet Placement Confirmation: ETT inserted through vocal cords under direct vision,  positive ETCO2 and breath sounds checked- equal and bilateral Secured at: 22 cm Tube secured with: Tape Dental Injury: Teeth and Oropharynx as per pre-operative assessment

## 2020-02-19 NOTE — Telephone Encounter (Signed)
Called and spoke with Janett Billow to let her know Dr. Sudie Bailey recs of taking Tylenol first but can take Aspirin. She advised understanding. Stated she will give him Tylenol now and will let him know that he can take his aspirin like normal tomorrow. Nothing further needed at this time.

## 2020-02-19 NOTE — Op Note (Signed)
Video Bronchoscopy with Electromagnetic Navigation Procedure Note  Date of Operation: 02/19/2020  Pre-op Diagnosis: Left lower lobe nodule  Post-op Diagnosis: Same  Surgeon: Baltazar Apo  Assistants: None  Anesthesia: General endotracheal anesthesia  Operation: Flexible video fiberoptic bronchoscopy with electromagnetic navigation and biopsies.  Estimated Blood Loss: Minimal  Complications: None apparent  Indications and History: [name] Graham is a 84 y.o. male with history of tobacco.  He was found to have a solitary left lower lobe pulmonary nodule on serial CT scans.  Recommendation was made to achieve tissue diagnosis via navigational bronchoscopy.  The risks, benefits, complications, treatment options and expected outcomes were discussed with the patient.  The possibilities of pneumothorax, pneumonia, reaction to medication, pulmonary aspiration, perforation of a viscus, bleeding, failure to diagnose a condition and creating a complication requiring transfusion or operation were discussed with the patient who freely signed the consent.    Description of Procedure: The patient was seen in the Preoperative Area, was examined and was deemed appropriate to proceed.  The patient was taken to [facility] endoscopy room 2, identified as [name] Graham and the procedure verified as Flexible Video Fiberoptic Bronchoscopy.  A Time Out was held and the above information confirmed.   Prior to the date of the procedure a high-resolution CT scan of the chest was performed. Utilizing [place] a virtual tracheobronchial tree was generated to allow the creation of distinct navigation pathways to the patient's parenchymal abnormalities. After being taken to the operating room general anesthesia was initiated and the patient  was orally intubated. The video fiberoptic bronchoscope was introduced via the endotracheal tube and a general inspection was performed which showed normal  airways on the right.  There was some white debris in the left lower lobe medial basilar segmental airway.  This was brushed and washed and did not appear to be an actual endobronchial lesion.  Preliminary cytology on this lesion revealed necrosis and possibly pathologic material. The extendable working channel and locator guide were introduced into the bronchoscope. The distinct navigation pathways prepared prior to this procedure were then utilized to navigate to within 0.3 cm from the center of patient's left lower lobe nodule identified on CT scan.  Interestingly the airway that led to the nodule was adjacent to the left lower lobe medial basilar segment where the necrotic debris was found.  The extendable working channel was secured into place and the locator guide was withdrawn. Under fluoroscopic guidance transbronchial needle brushings, transbronchial Wang needle biopsies, and transbronchial forceps biopsies were performed to be sent for cytology and pathology.  Three fiducial markers were placed triangulating the pulmonary nodule to facilitate radiation therapy should this become indicated going forward. At the end of the procedure a general airway inspection was performed and there was no evidence of active bleeding. The bronchoscope was removed.  The patient tolerated the procedure well. There was no significant blood loss and there were no obvious complications. A post-procedural chest x-ray is pending.  Samples: 1. Transbronchial needle brushings from left lower lobe pulmonary nodule 2. Transbronchial Wang needle biopsies from left lower lobe pulmonary nodule 3. Transbronchial forceps biopsies from left lower lobe pulmonary nodule 4.  Endobronchial brushings left lower lobe medial basilar segment 5. Endobronchial biopsies from left lower lobe medial basilar segment  Plans:  The patient will be discharged from the PACU to home when recovered from anesthesia and after chest x-ray is reviewed. We  will review the cytology, pathology and microbiology results with the patient  when they become available. Outpatient followup will be with Dr Lamonte Sakai.    Baltazar Apo, MD, PhD 02/19/2020, 12:50 PM [facility] [phone] or if no answer [phone]

## 2020-02-20 LAB — CYTOLOGY - NON PAP

## 2020-02-21 ENCOUNTER — Encounter (HOSPITAL_COMMUNITY): Payer: Self-pay | Admitting: Emergency Medicine

## 2020-02-21 ENCOUNTER — Encounter: Payer: Self-pay | Admitting: Emergency Medicine

## 2020-02-21 ENCOUNTER — Telehealth: Payer: Self-pay | Admitting: Emergency Medicine

## 2020-02-21 DIAGNOSIS — C3490 Malignant neoplasm of unspecified part of unspecified bronchus or lung: Secondary | ICD-10-CM

## 2020-02-21 NOTE — Telephone Encounter (Signed)
Spoke with patient about FOB results. Shows squamous cell CA. Suspect he will be a goo SBRT candidate.  Will order MRI brain, make referral to oncology in [facility].  He also needs a letter formally stating his diagnosis to use to enact an insurance policy - will write for him.

## 2020-02-26 ENCOUNTER — Encounter (HOSPITAL_COMMUNITY): Payer: Self-pay

## 2020-02-26 NOTE — Progress Notes (Signed)
I have placed an introductory phone call to this patient today. I introduced myself and explained my role in the patient's care. I briefly explained what the patient can expect during his initial visit with Dr. Delton Coombes. Patient reports no known barriers to care at this time, but does tell me that his daughter assists him a substantial amount in getting him to a from appts, testing and maintaining the prescribed medication regimen. Patient states that she will be accompanying him to his appt with Dr. Delton Coombes. Patient was given the opportunity to ask questions, all were answered to his satisfaction. I provided my contact information and encouraged the patient to call with any questions or concerns.

## 2020-02-28 ENCOUNTER — Other Ambulatory Visit: Payer: Self-pay

## 2020-02-28 ENCOUNTER — Inpatient Hospital Stay (HOSPITAL_COMMUNITY): Payer: Medicare HMO | Attending: Hematology | Admitting: Hematology

## 2020-02-28 VITALS — BP 146/83 | HR 76 | Temp 97.2°F | Resp 20 | Ht 70.0 in | Wt 137.6 lb

## 2020-02-28 DIAGNOSIS — Z79899 Other long term (current) drug therapy: Secondary | ICD-10-CM | POA: Diagnosis not present

## 2020-02-28 DIAGNOSIS — C3492 Malignant neoplasm of unspecified part of left bronchus or lung: Secondary | ICD-10-CM | POA: Diagnosis not present

## 2020-02-28 DIAGNOSIS — R69 Illness, unspecified: Secondary | ICD-10-CM | POA: Diagnosis not present

## 2020-02-28 DIAGNOSIS — I10 Essential (primary) hypertension: Secondary | ICD-10-CM | POA: Insufficient documentation

## 2020-02-28 DIAGNOSIS — F17210 Nicotine dependence, cigarettes, uncomplicated: Secondary | ICD-10-CM | POA: Diagnosis not present

## 2020-02-28 NOTE — Patient Instructions (Signed)
[place] at [facility] Discharge Instructions  You were seen and examined today by Dr. Delton Coombes. Dr. Delton Coombes discussed your past medical history, family history of cancer and the events that led to you being here today. Dr. Delton Coombes reviewed your PET scan results.  You have been diagnosed with Squamous Cell (Non-Small Cell) Lung Cancer.  Dr. Delton Coombes has recommended a brain MRI and a pulmonary function test to complete your work-up. This will tell us if there is cancer present in your brain related to your lung cancer and allow [place] to have a baseline pulmonary function status. This has already been ordered and set up by Dr. Agustina Caroli office.  We will see you back following the MRI and PFTs, if we are unable to surgically resect the cancer then we will arrange for radiation in [place].   Thank you for choosing [place] [facility] to provide your oncology and hematology care.  To afford each patient quality time with our provider, please arrive at least 15 minutes before your scheduled appointment time.   If you have a lab appointment with the [facility] please come in thru the Main Entrance and check in at the main information desk.  You need to re-schedule your appointment should you arrive 10 or more minutes late.  We strive to give you quality time with our providers, and arriving late affects you and other patients whose appointments are after yours.  Also, if you no show three or more times for appointments you may be dismissed from the clinic at the providers discretion.     Again, thank you for choosing [facility].  Our hope is that these requests will decrease the amount of time that you wait before being seen by our physicians.       _____________________________________________________________  Should you have questions after your visit to [facility], please contact our office at [phone]  and follow the prompts.  Our office hours are 8:00 a.m. and 4:30 p.m. Monday - Friday.  Please note that voicemails left after 4:00 p.m. may not be returned until the following business day.  We are closed weekends and major holidays.  You do have access to a nurse 24-7, just call the main number to the clinic [phone] and do not press any options, hold on the line and a nurse will answer the phone.    For prescription refill requests, have your pharmacy contact our office and allow 72 hours.    Due to Covid, you will need to wear a mask upon entering the hospital. If you do not have a mask, a mask will be given to you at the Main Entrance upon arrival. For doctor visits, patients may have 1 support person age 13 or older with them. For treatment visits, patients can not have anyone with them due to social distancing guidelines and our immunocompromised population.

## 2020-02-28 NOTE — Progress Notes (Signed)
[facility] [address] [place] [address]   CLINIC:  Medical Oncology/Hematology  CONSULT NOTE  Patient Care Team: Lysbeth Penner, FNP as PCP - General (Family Medicine) Satira Sark, MD as PCP - Cardiology (Cardiology) Gala Romney Cristopher Estimable, MD as Consulting Physician (Gastroenterology) Dishmon, Garwin Brothers, RN as Oncology Nurse Navigator (Oncology)  CHIEF COMPLAINTS/PURPOSE OF CONSULTATION:  Evaluation of left lung cancer  HISTORY OF PRESENTING ILLNESS:  [name] 84 y.o. male is here because of left lung cancer, at the request of Dr. Baltazar Apo. He had a video bronchoscopy on 10/19.  Today he is accompanied by his daughter and he reports feeling good. He has been on oxygen via [place] for the past 3 weeks; his oxygen level dropped to 81% after walking 8 feet. He is using it when he walks and sits, though not when he sleeps. He dropped to 129 lbs from 176 lbs over the past year and was put on Megace and he stopped losing weight. He denies having F/C, though he reports having 1 episode of night sweats last night; he denies vision changes, new cough, hemoptysis. He has not had any other cancers previously. He has an appointment with Dr. Lamonte Sakai on 11/2 for PFT; he will have an MRI brain on 11/1. He has 9 cardiac stents and balloon angioplasty done.  He continues smoking 1/4 PPD and smoked for 68 years, peaking at 2 PPD. 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 He does his ADL's and chores at home. His father had throat cancer.  MEDICAL HISTORY:  Past Medical History:  Diagnosis Date   Arthritis    COPD (chronic obstructive pulmonary disease) ([place])    Coronary atherosclerosis of native coronary artery    BMS circ and PTCA PDA 2007, DES RCA 10/09, DES LAD 2/12   Dyspnea    PULMONARY NODULE    Essential hypertension    GERD (gastroesophageal reflux disease)    Hearing  loss    no hearing aids   Heart attack ([place])    x 3 (latest 2009)   History of kidney stones    passed stones   Hyperlipidemia    Ischemic cardiomyopathy    NSTEMI (non-ST elevated myocardial infarction) ([place])    2007   Pulmonary nodule 1 cm or greater in diameter 02/2020   Dr Lamonte Sakai    SURGICAL HISTORY: Past Surgical History:  Procedure Laterality Date   ANGIOPLASTY     stent   BRONCHIAL BIOPSY  02/19/2020   Procedure: BRONCHIAL BIOPSIES;  Surgeon: Collene Gobble, MD;  Location: [facility] ENDOSCOPY;  Service: Pulmonary;;   BRONCHIAL BRUSHINGS  02/19/2020   Procedure: BRONCHIAL BRUSHINGS;  Surgeon: Collene Gobble, MD;  Location: [facility] ENDOSCOPY;  Service: Pulmonary;;   BRONCHIAL NEEDLE ASPIRATION BIOPSY  02/19/2020   Procedure: BRONCHIAL NEEDLE ASPIRATION BIOPSIES;  Surgeon: Collene Gobble, MD;  Location: [facility] ENDOSCOPY;  Service: Pulmonary;;   COLONOSCOPY     FIDUCIAL MARKER PLACEMENT  02/19/2020   Procedure: FIDUCIAL MARKER PLACEMENT;  Surgeon: Collene Gobble, MD;  Location: [facility] ENDOSCOPY;  Service: Pulmonary;;   HERNIA REPAIR     LIPOMA EXCISION Right    hip/buttox   TONSILLECTOMY     as a child   VIDEO BRONCHOSCOPY WITH ENDOBRONCHIAL NAVIGATION N/A 02/19/2020   Procedure: VIDEO BRONCHOSCOPY WITH ENDOBRONCHIAL NAVIGATION;  Surgeon: Collene Gobble, MD;  Location: [facility] ENDOSCOPY;  Service: Pulmonary;  Laterality: N/A;    SOCIAL HISTORY: Social History   Socioeconomic History   Marital status: Divorced    Spouse name: Not on file   Number of children: Not on file   Years of education: Not on file   Highest education level: Not on file  Occupational History   Occupation: Retired  Tobacco Use   Smoking status: Current Every Day Smoker    Packs/day: 0.50    Years: 63.00    Pack years: 31.50    Types: Cigarettes    Start date: 06/15/1951   Smokeless tobacco: Never Used   Tobacco comment: cutting back - 1/4-1/2 ppd  Vaping Use   Vaping Use: Never used   Substance and Sexual Activity   Alcohol use: No    Alcohol/week: 0.0 standard drinks   Drug use: No   Sexual activity: Not Currently  Other Topics Concern   Not on file  Social History Narrative   Not on file   Social Determinants of Health   Financial Resource Strain: Low Risk    Difficulty of Paying Living Expenses: Not hard at all  Food Insecurity: No Food Insecurity   Worried About Charity fundraiser in the Last Year: Never true   [place] in the Last Year: Never true  Transportation Needs: No Transportation Needs   Lack of Transportation (Medical): No   Lack of Transportation (Non-Medical): No  Physical Activity: Insufficiently Active   Days of Exercise per Week: 7 days   Minutes of Exercise per Session: 10 min  Stress: Stress Concern Present   Feeling of Stress : Very much  Social Connections: Socially Isolated   Frequency of Communication with Friends and Family: More than three times a week   Frequency of Social Gatherings with Friends and Family: More than three times a week   Attends Religious Services: Never   Marine scientist or Organizations: No   Attends Music therapist: Never   Marital Status: Divorced  Human resources officer Violence: Not At Risk   Fear of Current or Ex-Partner: No   Emotionally Abused: No   Physically Abused: No   Sexually Abused: No    FAMILY HISTORY: Family History  Problem Relation Age of Onset   Throat cancer Father    Heart attack Mother    Alzheimer's disease Mother     ALLERGIES:  is allergic to entresto [sacubitril-valsartan].  MEDICATIONS:  Current Outpatient Medications  Medication Sig Dispense Refill   albuterol (VENTOLIN HFA) 108 (90 Base) MCG/ACT inhaler Inhale into the lungs every 6 (six) hours as needed for wheezing or shortness of breath.     aspirin EC 81 MG tablet Take 1 tablet (81 mg total) by mouth daily. 90 tablet 3   clopidogrel (PLAVIX) 75 MG tablet Take  1 tablet (75 mg total) by mouth daily. Okay to restart this medication on Thursday, 02/21/2020 90 tablet 3   Ensure (ENSURE) Take 237 mLs by mouth 2 (two) times daily.     fluticasone (FLONASE) 50 MCG/ACT nasal spray Place into both nostrils 3 (three) times daily as needed for allergies or rhinitis.     metoprolol succinate (TOPROL-XL) 25 MG 24 hr tablet Take 0.5 tablets (12.5 mg total) by mouth daily.     Naphazoline HCl (CLEAR EYES OP) Place 1 drop into both eyes daily as needed (irritation / redness).     nitroGLYCERIN (NITROSTAT) 0.4 MG SL tablet Place 1 tablet (0.4  mg total) under the tongue every 5 (five) minutes as needed for chest pain.     olmesartan (BENICAR) 40 MG tablet Take 1 tablet (40 mg total) by mouth daily. 30 tablet 11   simvastatin (ZOCOR) 80 MG tablet Take 1 tablet (80 mg total) by mouth at bedtime.     Tiotropium Bromide Monohydrate (SPIRIVA RESPIMAT) 2.5 MCG/ACT AERS Inhale 2 puffs into the lungs daily. 4 g 0   No current facility-administered medications for this visit.    REVIEW OF SYSTEMS:   Review of Systems  Constitutional: Positive for diaphoresis (1 episode on 10/27), fatigue (80%) and unexpected weight change (47 lbs loss over 1 year). Negative for appetite change, chills and fever.  Respiratory: Positive for cough, shortness of breath and wheezing.   Genitourinary: Positive for difficulty urinating (weak flow).      PHYSICAL EXAMINATION: ECOG PERFORMANCE STATUS: 2 - Symptomatic, <50% confined to bed  Vitals:   02/28/20 1504  BP: (!) 146/83  Pulse: 76  Resp: 20  Temp: (!) 97.2 F (36.2 C)  SpO2: 96%   Filed Weights   02/28/20 1504  Weight: 137 lb 9.6 oz (62.4 kg)   Physical Exam Vitals reviewed.  Constitutional:      Appearance: Normal appearance.     Interventions: Nasal cannula in place.  Cardiovascular:     Rate and Rhythm: Normal rate and regular rhythm.     Heart sounds: Normal heart sounds.  Pulmonary:     Effort: Pulmonary  effort is normal.     Breath sounds: Normal breath sounds.  Abdominal:     General: There is no distension.     Palpations: Abdomen is soft. There is no mass.  Musculoskeletal:        General: No swelling.  Skin:    General: Skin is warm.  Neurological:     General: No focal deficit present.     Mental Status: He is alert and oriented to person, place, and time.  Psychiatric:        Mood and Affect: Mood normal.        Behavior: Behavior normal.      LABORATORY DATA:  I have reviewed the data as listed CBC Latest Ref Rng & Units 02/07/2020 10/09/2018 04/10/2016  WBC 4.0 - 10.5 K/uL 7.7 10.0 6.9  Hemoglobin 13.0 - 17.0 g/dL 15.6 15.9 16.0  Hematocrit 39 - 52 % 46.0 46.4 46.3  Platelets 150 - 400 K/uL 256 208 181   CMP Latest Ref Rng & Units 02/07/2020 10/09/2018 06/20/2018  Glucose 70 - 99 mg/dL 96 102(H) 95  BUN 8 - 23 mg/dL 21 22 24(H)  Creatinine 0.61 - 1.24 mg/dL 1.08 1.20 0.96  Sodium 135 - 145 mmol/L 135 139 138  Potassium 3.5 - 5.1 mmol/L 4.5 4.7 4.2  Chloride 98 - 111 mmol/L 102 104 105  CO2 22 - 32 mmol/L 25 23 26   Calcium 8.9 - 10.3 mg/dL 9.4 8.9 9.1  Total Protein 6.5 - 8.1 g/dL 7.5 7.2 -  Total Bilirubin 0.3 - 1.2 mg/dL 0.6 0.6 -  Alkaline Phos 38 - 126 U/L 45 48 -  AST 15 - 41 U/L 18 17 -  ALT 0 - 44 U/L 14 14 -   Cytology ([id number]) on 02/19/2020: LLL lung fine needle aspiration: consistent with squamous cell carcinoma.  RADIOGRAPHIC STUDIES: I have personally reviewed the radiological images as listed and agreed with the findings in the report. DG Chest Port 1 View  Result Date: 02/19/2020  CLINICAL DATA:  Follow-up bronchoscopy and biopsy. EXAM: PORTABLE CHEST 1 VIEW COMPARISON:  10/09/2018.  CT 02/08/2020. FINDINGS: Heart size is normal. Aortic atherosclerosis and tortuosity. The lungs show emphysema and pleural and parenchymal scarring at the apices. Clips are present in a left infrahilar mass. No pneumothorax or hemothorax. No evidence significant  pulmonary hemorrhage. IMPRESSION: No active disease. Emphysema and pleural and parenchymal scarring at the apices. Clips in the left infrahilar region within a mass better shown by CT. No pneumothorax or hemothorax. No evidence of significant pulmonary hemorrhage. Electronically Signed   By: Nelson Chimes M.D.   On: 02/19/2020 13:14   CT Super D Chest Wo Contrast  Result Date: 02/08/2020 CLINICAL DATA:  Preop for bronchoscopic biopsy. PET positive left lower lobe lung mass. EXAM: CT CHEST WITHOUT CONTRAST TECHNIQUE: Multidetector CT imaging of the chest was performed using thin slice collimation for electromagnetic bronchoscopy planning purposes, without intravenous contrast. COMPARISON:  PET-CT 01/28/2020 and chest CT 01/10/2020 FINDINGS: Cardiovascular: The heart is normal in size. No pericardial effusion. Stable tortuosity, ectasia and calcification of the thoracic aorta. Stable advanced three-vessel coronary artery calcifications. Mediastinum/Nodes: Stable borderline mediastinal and hilar lymph nodes some of which appear to be metabolically active on the PET-CT. Lungs/Pleura: Stable 3 cm left lower lobe lung mass which was hypermetabolic on the PET-CT. Underlying emphysematous changes and areas of pulmonary scarring. A few small scattered pulmonary nodules are stable and indeterminate. Recommend CT surveillance. No pleural effusions or pleural lesions. Subpleural nodular density with some surrounding interstitial changes at the left lung base appears stable. This was not hypermetabolic on the PET-CT but recommend continued surveillance. Upper Abdomen: No significant upper abdominal findings. Stable advanced aortic and branch vessel calcifications. No hepatic or adrenal gland lesions. Musculoskeletal: No chest wall mass, supraclavicular or axillary adenopathy. The bony thorax is intact. IMPRESSION: 1. Stable 3 cm left lower lobe lung mass which was hypermetabolic on the PET-CT. 2. Stable borderline mediastinal  and hilar lymph nodes some of which appear to be metabolically active on the PET-CT. 3. A few small scattered pulmonary nodules are stable and indeterminate. Recommend CT surveillance. 4. Stable emphysematous changes and pulmonary scarring. 5. Stable advanced three-vessel coronary artery calcifications. 6. No findings for upper abdominal metastatic disease. 7. Emphysema and aortic atherosclerosis. Aortic Atherosclerosis (ICD10-I70.0) and Emphysema (ICD10-J43.9). Electronically Signed   By: Marijo Sanes M.D.   On: 02/08/2020 16:12   DG C-ARM BRONCHOSCOPY  Result Date: 02/19/2020 C-ARM BRONCHOSCOPY: Fluoroscopy was utilized by the requesting physician.  No radiographic interpretation.    ASSESSMENT:  1.  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 -Patient placed on O2 nasal cannula in the last 3 weeks for drop in sats on exertion. -PET scan on 01/28/2020 shows 2.6 cm left lower lobe nodule SUV 10.1.  Focal hypermetabolic them in the left hilum identified without discrete lymph node visible on CT.  10 mm short axis precarinal lymph node with low-level SUV 2.9.  7 mm short axis right hilar node with SUV 3.8. -Navigational bronchoscopy and biopsy of the left lower lobe lung lesion consistent with squamous cell carcinoma.  2.  Social/family history: -2 pack/day smoker for 68 years.  Currently smokes 4 cigarettes/day. 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 -Father had throat cancer.  PLAN:  1.  Clinical T1c N0 M0 squamous cell carcinoma of left lung  lower lobe: -Patient has significant CAD and COPD making him not an ideal candidate for surgical resection. -We will follow up on the pulmonary function test. -He will also have MRI of the brain with and without gadolinium. -We talked about option of SBRT for the left lung lesion. -We will see him back  after the PFTs and MRI to discuss further plan.   Derek Jack, MD, 02/28/20 4:16 PM  [place] [phone]   I, Milinda Antis, am acting as a scribe for Dr. Sanda Linger.  I, Derek Jack MD, have reviewed the above documentation for accuracy and completeness, and I agree with the above.

## 2020-03-03 ENCOUNTER — Ambulatory Visit (HOSPITAL_COMMUNITY)
Admission: RE | Admit: 2020-03-03 | Discharge: 2020-03-03 | Disposition: A | Discharge status: Home / Self Care | Payer: Medicare HMO | Source: Ambulatory Visit | Attending: Emergency Medicine | Admitting: Emergency Medicine

## 2020-03-03 ENCOUNTER — Other Ambulatory Visit: Payer: Self-pay

## 2020-03-03 DIAGNOSIS — G319 Degenerative disease of nervous system, unspecified: Secondary | ICD-10-CM | POA: Diagnosis not present

## 2020-03-03 DIAGNOSIS — C3490 Malignant neoplasm of unspecified part of unspecified bronchus or lung: Secondary | ICD-10-CM | POA: Diagnosis present

## 2020-03-03 DIAGNOSIS — I6782 Cerebral ischemia: Secondary | ICD-10-CM | POA: Diagnosis not present

## 2020-03-03 DIAGNOSIS — R42 Dizziness and giddiness: Secondary | ICD-10-CM | POA: Diagnosis not present

## 2020-03-03 DIAGNOSIS — R531 Weakness: Secondary | ICD-10-CM | POA: Diagnosis not present

## 2020-03-03 MED ORDER — GADOBUTROL 1 MMOL/ML IV SOLN
8.0000 mL | Freq: Once | INTRAVENOUS | Status: AC | PRN
  Administered 2020-03-03: 8 mL via INTRAVENOUS

## 2020-03-05 ENCOUNTER — Ambulatory Visit (INDEPENDENT_AMBULATORY_CARE_PROVIDER_SITE_OTHER): Payer: Medicare HMO | Admitting: Emergency Medicine

## 2020-03-05 ENCOUNTER — Encounter: Payer: Self-pay | Admitting: Emergency Medicine

## 2020-03-05 ENCOUNTER — Other Ambulatory Visit: Payer: Self-pay

## 2020-03-05 DIAGNOSIS — F17210 Nicotine dependence, cigarettes, uncomplicated: Secondary | ICD-10-CM | POA: Diagnosis not present

## 2020-03-05 DIAGNOSIS — J449 Chronic obstructive pulmonary disease, unspecified: Secondary | ICD-10-CM

## 2020-03-05 DIAGNOSIS — R69 Illness, unspecified: Secondary | ICD-10-CM | POA: Diagnosis not present

## 2020-03-05 DIAGNOSIS — C3492 Malignant neoplasm of unspecified part of left bronchus or lung: Secondary | ICD-10-CM | POA: Diagnosis not present

## 2020-03-05 NOTE — Progress Notes (Signed)
Subjective:    Patient ID: [name] Graham, male    DOB: 1936/04/16, 84 y.o.   MRN: [id number]  HPI 84 year old man, active smoker (32 pack years) with a history of CAD and MI, hypertension, hyperlipidemia, COPD.  He is followed by Dr. Melvyn Novas in our office for COPD and also for pulmonary nodular disease.  They have been following a small 4 mm right upper lobe pulmonary nodule.  Repeat CT done 01/10/2020 reviewed by me, shows a new spiculated 2.4 cm nodule in the left lower lobe.  This positive PET scan done 01/29/2020, reviewed by me, shows marked hypermetabolism of the infrahilar left lower lobe nodule concerning for malignancy.  There is focal hypermetabolism in left hilum suggestive of metastatic disease although difficult to see a CT correlate.  No PET evidence of distant disease.  Pulmonary function testing done 07/26/2017 reviewed by me, show moderate severe obstruction with a positive bronchodilator response, FEV1 1.74 L (64% predicted)  40 lbs of wt los over the last yr, progressive exertional SOB over the last year. He is on albuterol, uses about 2x a day.   ROV 03/05/20 --[name] Graham follows up today for his tobacco use, COPD and pulmonary nodular disease. up today for his tobacco use, COPD and pulmonary nodular disease.  We performed a bronchoscopy on 02/19/2020 with left lower lobe nodule biopsies that showed squamous cell carcinoma.  He has been seen by Dr. Delton Coombes with oncology at [facility] - being considered for resection, just had PFT done in [place]. I did place fiducial markers to facilitate XRT if this is better strategy.  We tried him on Spiriva Respimat to see if he would benefit - he believes that it may have helped him some. He has albuterol, uses about 0-1x a day.  He qualified for exertional O2 last visit, has a continuous system. Interested in a pulsed system and POC if possible.   MRI brain 03/03/2020 reviewed by me, shows no evidence of metastatic disease His original PET scan from 01/28/2020 showed hypermetabolic infrahilar left lower lobe nodule that  involve the left hilum with small precarinal and hilar lymph nodes   Review of Systems As per HPI     Objective:   Physical Exam  Vitals:   03/05/20 1145  BP: 120/70  Pulse: 75  Temp: 97.9 F (36.6 C)  SpO2: 99%  Weight: 138 lb 6.4 oz (62.8 kg)  Height: 5\' 10"  (1.778 m)   Gen: Pleasant, thin elderly man, in no distress,  normal affect  ENT: No lesions,  mouth clear,  oropharynx clear, no postnasal drip  Neck: No JVD, no stridor  Lungs: No use of accessory muscles, distant, no wheeze or crackles.   Cardiovascular: RRR, heart sounds normal, no murmur or gallops, no peripheral edema  Musculoskeletal: No deformities, no cyanosis or clubbing  Neuro: alert, awake, non focal  Skin: Warm, no lesions or rash       Assessment & Plan:  Squamous cell lung cancer, left ([place]) Diagnosed by [facility] on 02/19/2020.  Following with Dr. Delton Coombes.  FEV1 in 2019 may been accepted for possible resection.  Repeat PFT which was done in [facility] and I do not have the results in front of me.  Given his oxygen need, his significant coronary disease, probable borderline PFT, I suspect that best strategy here is SBRT.  He is going to follow with Dr. Delton Coombes, also get input from Dr. Domenic Polite with cardiology.  COPD  GOLD II/ still smoking  Seems to have benefited from the Spiriva, plan to continue it for now.  He is using albuterol several times a week, also would benefit.  Cigarette smoker Still smoking.  Discussed cutting down with him today.  This will be an ongoing project.  [name] Apo, MD, PhD 03/05/2020, 12:19 PM [facility] [phone] or if no answer [phone]

## 2020-03-05 NOTE — Assessment & Plan Note (Signed)
Diagnosed by [facility] on 02/19/2020.  Following with Dr. Delton Coombes.  FEV1 in 2019 may been accepted for possible resection.  Repeat PFT which was done in [facility] and I do not have the results in front of me.  Given his oxygen need, his significant coronary disease, probable borderline PFT, I suspect that best strategy here is SBRT.  He is going to follow with Dr. Delton Coombes, also get input from Dr. Domenic Polite with cardiology.

## 2020-03-05 NOTE — Patient Instructions (Signed)
Please continue Spiriva Respimat 2 puffs once daily. Keep your albuterol available use 2 puffs if needed for shortness of breath, chest tightness, wheezing. Follow with Dr. Delton Coombes as planned.  Review your recent pulmonary function testing and input from Dr. Domenic Polite to help determine whether you may be a candidate for lung surgery.  I suspect that the safest approach here would be for you to have targeted radiation to your pulmonary nodule. We will repeat your walking oximetry today to see if you qualify for portable oxygen concentrator.  If so we will order this for you through your medical equipment company. Follow-up with Dr. Melvyn Novas in about 6 months or sooner if you have any problems.

## 2020-03-05 NOTE — Assessment & Plan Note (Signed)
Still smoking.  Discussed cutting down with him today.  This will be an ongoing project.

## 2020-03-05 NOTE — Assessment & Plan Note (Signed)
Seems to have benefited from the Spiriva, plan to continue it for now.  He is using albuterol several times a week, also would benefit.

## 2020-03-06 ENCOUNTER — Other Ambulatory Visit: Payer: Self-pay | Admitting: *Deleted

## 2020-03-06 NOTE — Progress Notes (Signed)
The proposed treatment discussed in cancer conference 03/06/20 is for discussion purpose only and is not a binding recommendation.  The patient was not physically examined nor present for their treatment options.  Therefore, final treatment plans cannot be decided.

## 2020-03-07 DIAGNOSIS — J449 Chronic obstructive pulmonary disease, unspecified: Secondary | ICD-10-CM | POA: Diagnosis not present

## 2020-03-07 DIAGNOSIS — I2510 Atherosclerotic heart disease of native coronary artery without angina pectoris: Secondary | ICD-10-CM | POA: Diagnosis not present

## 2020-03-10 ENCOUNTER — Ambulatory Visit (HOSPITAL_COMMUNITY): Payer: Medicare HMO | Admitting: Hematology

## 2020-03-11 ENCOUNTER — Other Ambulatory Visit: Payer: Self-pay

## 2020-03-11 ENCOUNTER — Inpatient Hospital Stay (HOSPITAL_COMMUNITY): Payer: Medicare HMO | Attending: Hematology | Admitting: Hematology

## 2020-03-11 ENCOUNTER — Encounter (HOSPITAL_COMMUNITY): Payer: Self-pay

## 2020-03-11 VITALS — BP 157/80 | HR 76 | Temp 96.7°F | Resp 18 | Wt 139.1 lb

## 2020-03-11 DIAGNOSIS — C3492 Malignant neoplasm of unspecified part of left bronchus or lung: Secondary | ICD-10-CM | POA: Insufficient documentation

## 2020-03-11 DIAGNOSIS — Z87442 Personal history of urinary calculi: Secondary | ICD-10-CM | POA: Diagnosis not present

## 2020-03-11 DIAGNOSIS — K219 Gastro-esophageal reflux disease without esophagitis: Secondary | ICD-10-CM | POA: Insufficient documentation

## 2020-03-11 DIAGNOSIS — R42 Dizziness and giddiness: Secondary | ICD-10-CM | POA: Insufficient documentation

## 2020-03-11 DIAGNOSIS — R634 Abnormal weight loss: Secondary | ICD-10-CM | POA: Diagnosis not present

## 2020-03-11 DIAGNOSIS — I10 Essential (primary) hypertension: Secondary | ICD-10-CM | POA: Insufficient documentation

## 2020-03-11 DIAGNOSIS — F17210 Nicotine dependence, cigarettes, uncomplicated: Secondary | ICD-10-CM | POA: Diagnosis not present

## 2020-03-11 DIAGNOSIS — M129 Arthropathy, unspecified: Secondary | ICD-10-CM | POA: Diagnosis not present

## 2020-03-11 DIAGNOSIS — Z7982 Long term (current) use of aspirin: Secondary | ICD-10-CM | POA: Insufficient documentation

## 2020-03-11 DIAGNOSIS — R531 Weakness: Secondary | ICD-10-CM | POA: Insufficient documentation

## 2020-03-11 DIAGNOSIS — J449 Chronic obstructive pulmonary disease, unspecified: Secondary | ICD-10-CM | POA: Insufficient documentation

## 2020-03-11 DIAGNOSIS — R911 Solitary pulmonary nodule: Secondary | ICD-10-CM | POA: Insufficient documentation

## 2020-03-11 DIAGNOSIS — I252 Old myocardial infarction: Secondary | ICD-10-CM | POA: Diagnosis not present

## 2020-03-11 DIAGNOSIS — I255 Ischemic cardiomyopathy: Secondary | ICD-10-CM | POA: Insufficient documentation

## 2020-03-11 DIAGNOSIS — I6782 Cerebral ischemia: Secondary | ICD-10-CM | POA: Insufficient documentation

## 2020-03-11 DIAGNOSIS — I2510 Atherosclerotic heart disease of native coronary artery without angina pectoris: Secondary | ICD-10-CM | POA: Diagnosis not present

## 2020-03-11 DIAGNOSIS — E785 Hyperlipidemia, unspecified: Secondary | ICD-10-CM | POA: Diagnosis not present

## 2020-03-11 DIAGNOSIS — Z79899 Other long term (current) drug therapy: Secondary | ICD-10-CM | POA: Diagnosis not present

## 2020-03-11 DIAGNOSIS — I700 Atherosclerosis of aorta: Secondary | ICD-10-CM | POA: Insufficient documentation

## 2020-03-11 NOTE — Patient Instructions (Signed)
[place] at [facility] Discharge Instructions  You were seen today by Dr. Delton Coombes. He went over your recent results and scans. The optimal treatment course will consist of radiation, specifically SBRT in [place]. You will then be scheduled for a CT chest before your next visit. Dr. Delton Coombes will see you back in 4 months for labs and follow up.   Thank you for choosing [place] at [facility] to provide your oncology and hematology care.  To afford each patient quality time with our provider, please arrive at least 15 minutes before your scheduled appointment time.   If you have a lab appointment with the [facility] please come in thru the Main Entrance and check in at the main information desk  You need to re-schedule your appointment should you arrive 10 or more minutes late.  We strive to give you quality time with our providers, and arriving late affects you and other patients whose appointments are after yours.  Also, if you no show three or more times for appointments you may be dismissed from the clinic at the providers discretion.     Again, thank you for choosing [facility].  Our hope is that these requests will decrease the amount of time that you wait before being seen by our physicians.       _____________________________________________________________  Should you have questions after your visit to [facility], please contact our office at [phone] between the hours of 8:00 a.m. and 4:30 p.m.  Voicemails left after 4:00 p.m. will not be returned until the following business day.  For prescription refill requests, have your pharmacy contact our office and allow 72 hours.    Cancer Center Support Programs:   > Cancer Support Group  2nd Tuesday of the month 1pm-2pm, Journey Room

## 2020-03-11 NOTE — Progress Notes (Signed)
[place], [place] [address]   CLINIC:  Medical Oncology/Hematology  PCP:  [name] Graham, [place] / [place] [id number] [phone]   REASON FOR VISIT:  Follow-up for left lung cancer  PRIOR THERAPY: None  NGS Results: Not done  CURRENT THERAPY: Under work-up  BRIEF ONCOLOGIC HISTORY:  Oncology History   No history exists.    CANCER STAGING: Cancer Staging No matching staging information was found for the patient.  INTERVAL HISTORY:  [name], a 84 y.o. male, returns for routine follow-up of his left lung cancer. [name] Graham was last seen on 02/28/2020.   Today he is accompanied by his daughter and he reports feeling well. His breathing is good and he came without oxygen today. He has PFT's done recently at [facility]. His appetite is good.  He will see [name]. Domenic Graham in March 2022.   REVIEW OF SYSTEMS:  Review of Systems  Constitutional: Positive for fatigue (75%). Negative for appetite change.  Respiratory: Positive for shortness of breath.   All other systems reviewed and are negative.   PAST MEDICAL/SURGICAL HISTORY:  Past Medical History:  Diagnosis Date   Arthritis    COPD (chronic obstructive pulmonary disease) ([place])    Coronary atherosclerosis of native coronary artery    BMS circ and PTCA PDA 2007, DES RCA 10/09, DES LAD 2/12   Dyspnea    PULMONARY NODULE    Essential hypertension    GERD (gastroesophageal reflux disease)    Hearing loss    no hearing aids   Heart attack ([place])    x 3 (latest 2009)   History of kidney stones    passed stones   Hyperlipidemia    Ischemic cardiomyopathy    NSTEMI (non-ST elevated myocardial infarction) ([place])    2007   Pulmonary nodule 1 cm or greater in diameter 02/2020   [name] Graham   Past Surgical History:  Procedure Laterality Date   ANGIOPLASTY     stent   BRONCHIAL BIOPSY  02/19/2020   Procedure: BRONCHIAL  BIOPSIES;  Surgeon: [name] Gobble, MD;  Location: [facility] ENDOSCOPY;  Service: Pulmonary;;   BRONCHIAL BRUSHINGS  02/19/2020   Procedure: BRONCHIAL BRUSHINGS;  Surgeon: [name] Gobble, MD;  Location: [facility] ENDOSCOPY;  Service: Pulmonary;;   BRONCHIAL NEEDLE ASPIRATION BIOPSY  02/19/2020   Procedure: BRONCHIAL NEEDLE ASPIRATION BIOPSIES;  Surgeon: [name] Gobble, MD;  Location: [facility] ENDOSCOPY;  Service: Pulmonary;;   COLONOSCOPY     FIDUCIAL MARKER PLACEMENT  02/19/2020   Procedure: FIDUCIAL MARKER PLACEMENT;  Surgeon: [name] Gobble, MD;  Location: [facility] ENDOSCOPY;  Service: Pulmonary;;   HERNIA REPAIR     LIPOMA EXCISION Right    hip/buttox   TONSILLECTOMY     as a child   VIDEO BRONCHOSCOPY WITH ENDOBRONCHIAL NAVIGATION N/A 02/19/2020   Procedure: VIDEO BRONCHOSCOPY WITH ENDOBRONCHIAL NAVIGATION;  Surgeon: [name] Gobble, MD;  Location: [facility] ENDOSCOPY;  Service: Pulmonary;  Laterality: N/A;    SOCIAL HISTORY:  Social History   Socioeconomic History   Marital status: Divorced    Spouse name: Not on file   Number of children: Not on file   Years of education: Not on file   Highest education level: Not on file  Occupational History   Occupation: Retired  Tobacco Use   Smoking status: Current Every Day Smoker    Packs/day: 0.50    Years: 63.00    Pack years: 31.50  Types: Cigarettes    Start date: 06/15/1951   Smokeless tobacco: Never Used   Tobacco comment: 4 cigarettes smoked daily 03/05/20 [facility]   Vaping Use   Vaping Use: Never used  Substance and Sexual Activity   Alcohol use: No    Alcohol/week: 0.0 standard drinks   Drug use: No   Sexual activity: Not Currently  Other Topics Concern   Not on file  Social History Narrative   Not on file   Social Determinants of Health   Financial Resource Strain: Low Risk    Difficulty of Paying Living Expenses: Not hard at all  Food Insecurity: No Food Insecurity   Worried About Charity fundraiser in the Last  Year: Never true   Arboriculturist in the Last Year: Never true  Transportation Needs: No Transportation Needs   Lack of Transportation (Medical): No   Lack of Transportation (Non-Medical): No  Physical Activity: Insufficiently Active   Days of Exercise per Week: 7 days   Minutes of Exercise per Session: 10 min  Stress: Stress Concern Present   Feeling of Stress : Very much  Social Connections: Socially Isolated   Frequency of Communication with Friends and Family: More than three times a week   Frequency of Social Gatherings with Friends and Family: More than three times a week   Attends Religious Services: Never   Marine scientist or Organizations: No   Attends Music therapist: Never   Marital Status: Divorced  Human resources officer Violence: Not At Risk   Fear of Current or Ex-Partner: No   Emotionally Abused: No   Physically Abused: No   Sexually Abused: No    FAMILY HISTORY:  Family History  Problem Relation Age of Onset   Throat cancer Father    Heart attack Mother    Alzheimer's disease Mother     CURRENT MEDICATIONS:  Current Outpatient Medications  Medication Sig Dispense Refill   albuterol (VENTOLIN HFA) 108 (90 Base) MCG/ACT inhaler Inhale into the lungs every 6 (six) hours as needed for wheezing or shortness of breath.     aspirin EC 81 MG tablet Take 1 tablet (81 mg total) by mouth daily. 90 tablet 3   clopidogrel (PLAVIX) 75 MG tablet Take 1 tablet (75 mg total) by mouth daily. Okay to restart this medication on Thursday, 02/21/2020 90 tablet 3   Ensure (ENSURE) Take 237 mLs by mouth 2 (two) times daily.     fluticasone (FLONASE) 50 MCG/ACT nasal spray Place into both nostrils 3 (three) times daily as needed for allergies or rhinitis.     metoprolol succinate (TOPROL-XL) 25 MG 24 hr tablet Take 0.5 tablets (12.5 mg total) by mouth daily.     Naphazoline HCl (CLEAR EYES OP) Place 1 drop into both eyes daily as needed  (irritation / redness).     olmesartan (BENICAR) 40 MG tablet Take 1 tablet (40 mg total) by mouth daily. 30 tablet 11   simvastatin (ZOCOR) 80 MG tablet Take 1 tablet (80 mg total) by mouth at bedtime.     Tiotropium Bromide Monohydrate (SPIRIVA RESPIMAT) 2.5 MCG/ACT AERS Inhale 2 puffs into the lungs daily. 4 g 0   nitroGLYCERIN (NITROSTAT) 0.4 MG SL tablet Place 1 tablet (0.4 mg total) under the tongue every 5 (five) minutes as needed for chest pain. (Patient not taking: Reported on 03/11/2020)     No current facility-administered medications for this visit.    ALLERGIES:  Allergies  Allergen Reactions  Entresto [Sacubitril-Valsartan] Itching    PHYSICAL EXAM:  Performance status (ECOG): 2 - Symptomatic, <50% confined to bed  Vitals:   03/11/20 0920  BP: (!) 157/80  Pulse: 76  Resp: 18  Temp: (!) 96.7 F (35.9 C)  SpO2: 99%   Wt Readings from Last 3 Encounters:  03/11/20 139 lb 1.8 oz (63.1 kg)  03/05/20 138 lb 6.4 oz (62.8 kg)  02/28/20 137 lb 9.6 oz (62.4 kg)   Physical Exam Vitals reviewed.  Constitutional:      Appearance: Normal appearance.  Cardiovascular:     Rate and Rhythm: Normal rate and regular rhythm.     Pulses: Normal pulses.     Heart sounds: Normal heart sounds.  Pulmonary:     Effort: Pulmonary effort is normal.     Breath sounds: Normal breath sounds.  Musculoskeletal:     Right lower leg: No edema.     Left lower leg: No edema.  Neurological:     General: No focal deficit present.     Mental Status: He is alert and oriented to person, place, and time.  Psychiatric:        Mood and Affect: Mood normal.        Behavior: Behavior normal.      LABORATORY DATA:  I have reviewed the labs as listed.  CBC Latest Ref Rng & Units 02/07/2020 10/09/2018 04/10/2016  WBC 4.0 - 10.5 K/uL 7.7 10.0 6.9  Hemoglobin 13.0 - 17.0 g/dL 15.6 15.9 16.0  Hematocrit 39 - 52 % 46.0 46.4 46.3  Platelets 150 - 400 K/uL 256 208 181   CMP Latest Ref Rng & Units  02/07/2020 10/09/2018 06/20/2018  Glucose 70 - 99 mg/dL 96 102(H) 95  BUN 8 - 23 mg/dL 21 22 24(H)  Creatinine 0.61 - 1.24 mg/dL 1.08 1.20 0.96  Sodium 135 - 145 mmol/L 135 139 138  Potassium 3.5 - 5.1 mmol/L 4.5 4.7 4.2  Chloride 98 - 111 mmol/L 102 104 105  CO2 22 - 32 mmol/L 25 23 26   Calcium 8.9 - 10.3 mg/dL 9.4 8.9 9.1  Total Protein 6.5 - 8.1 g/dL 7.5 7.2 -  Total Bilirubin 0.3 - 1.2 mg/dL 0.6 0.6 -  Alkaline Phos 38 - 126 U/L 45 48 -  AST 15 - 41 U/L 18 17 -  ALT 0 - 44 U/L 14 14 -    DIAGNOSTIC IMAGING:  I have independently reviewed the scans and discussed with the patient. MR BRAIN W WO CONTRAST  Result Date: 03/03/2020 CLINICAL DATA:  Non-small cell lung cancer. Staging. Weakness and dizziness. EXAM: MRI HEAD WITHOUT AND WITH CONTRAST TECHNIQUE: Multiplanar, multiecho pulse sequences of the brain and surrounding structures were obtained without and with intravenous contrast. CONTRAST:  40mL GADAVIST GADOBUTROL 1 MMOL/ML IV SOLN COMPARISON:  Head CT report 11/23/2016 FINDINGS: Brain: Diffusion imaging does not show any acute or subacute infarction or other cause of restricted diffusion. The brainstem is normal. Few old small vessel cerebellar infarctions. Cerebral hemispheres show age related atrophy with moderate chronic small-vessel ischemic change of the deep white matter. No cortical or large vessel territory infarction. No evidence of primary or metastatic mass lesion, hemorrhage, hydrocephalus or extra-axial collection. Vascular: Major vessels at the base of the brain show flow. Skull and upper cervical spine: Negative Sinuses/Orbits: Sinuses are clear except for a small amount of layering fluid in the left division of the sphenoid sinus. Orbits are negative. Other: None IMPRESSION: No evidence of metastatic disease. Age related volume  loss. Chronic small-vessel ischemic changes of the cerebellum and cerebral hemispheric white matter. Electronically Signed   By: Nelson Chimes M.D.   On:  03/03/2020 16:32   DG Chest Port 1 View  Result Date: 02/19/2020 CLINICAL DATA:  Follow-up bronchoscopy and biopsy. EXAM: PORTABLE CHEST 1 VIEW COMPARISON:  10/09/2018.  CT 02/08/2020. FINDINGS: Heart size is normal. Aortic atherosclerosis and tortuosity. The lungs show emphysema and pleural and parenchymal scarring at the apices. Clips are present in a left infrahilar mass. No pneumothorax or hemothorax. No evidence significant pulmonary hemorrhage. IMPRESSION: No active disease. Emphysema and pleural and parenchymal scarring at the apices. Clips in the left infrahilar region within a mass better shown by CT. No pneumothorax or hemothorax. No evidence of significant pulmonary hemorrhage. Electronically Signed   By: Nelson Chimes M.D.   On: 02/19/2020 13:14   DG C-ARM BRONCHOSCOPY  Result Date: 02/19/2020 C-ARM BRONCHOSCOPY: Fluoroscopy was utilized by the requesting physician.  No radiographic interpretation.     ASSESSMENT:  1.  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 -Patient placed on O2 nasal cannula in the last 3 weeks for drop in sats on exertion. -PET scan on 01/28/2020 shows 2.6 cm left lower lobe nodule SUV 10.1.  Focal hypermetabolic them in the left hilum identified without discrete lymph node visible on CT.  10 mm short axis precarinal lymph node with low-level SUV 2.9.  7 mm short axis right hilar node with SUV 3.8. -Navigational bronchoscopy and biopsy of the left lower lobe lung lesion consistent with squamous cell carcinoma.  2.  Social/family history: -2 pack/day smoker for 68 years.  Currently smokes 4 cigarettes/day. 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 -Father had throat cancer.   PLAN:  1.  Clinical T1c N0 M0 squamous cell carcinoma of left lung lower lobe: -He has significant CAD and COPD make him not an ideal  candidate for surgical resection. -He reportedly had PFTs done in [facility]  I could not access the reports. -We reviewed results of MRI of the brain from 03/03/2020 which did not show any evidence of metastatic disease. -His case was discussed at tumor board. -Because of his functional status, SBRT of the lung lesion was recommended.  We will make a referral to radiation oncology. -He will be seen back in 4 months with repeat CT scan of the chest.   Orders placed this encounter:  Orders Placed This Encounter  Procedures   CT Chest Cressey, MD [place] [phone]   I, Milinda Antis, am acting as a scribe for [name]. Sanda Linger.  I, Derek Jack MD, have reviewed the above documentation for accuracy and completeness, and I agree with the above.

## 2020-03-11 NOTE — Progress Notes (Signed)
Patient's Pulmonary Function Test results have been requested from [facility]

## 2020-03-21 NOTE — Progress Notes (Signed)
Patient here for a consult with Dr. Sondra Come.  Thoracic Location of Tumor / Histology: Left lung cancer  Patient was being followed at pulmonologist for a 4 mm upper right lobs pulmonary nodule. CT 01/10/2020 shows a a left lower lobe nodule concerning for malignancy.  Biopsies 02/19/2020 revealed Squamous cell carcinoma  Tobacco/Marijuana/Snuff/ETOH use: yes, currently smoking.  Past/Anticipated interventions by cardiothoracic surgery, if any: Videobronchoscopy  Past/Anticipated interventions by medical oncology, if any: none  Signs/Symptoms   Weight changes, if any: no    Respiratory complaints, if any: no    Hemoptysis, if any: no    Pain issues, if any:  no  SAFETY ISSUES:   Prior radiation? no    Pacemaker/ICD? no   Possible current pregnancy? N/A   Is the patient on methotrexate? no  Current Complaints / other details:  Accompanied by his daughter  BP (!) 144/85 (BP Location: Left Arm, Patient Position: Sitting)   Pulse 68   Temp 97.6 F (36.4 C) (Temporal)   Resp 18   Ht 5\' 10"  (1.778 m)   Wt 142 lb 8 oz (64.6 kg)   SpO2 99%   BMI 20.45 kg/m    Wt Readings from Last 3 Encounters:  03/26/20 142 lb 8 oz (64.6 kg)  03/11/20 139 lb 1.8 oz (63.1 kg)  03/05/20 138 lb 6.4 oz (62.8 kg)

## 2020-03-26 ENCOUNTER — Ambulatory Visit
Admission: RE | Admit: 2020-03-26 | Discharge: 2020-03-26 | Disposition: A | Discharge status: Home / Self Care | Payer: Medicare HMO | Source: Ambulatory Visit | Attending: Radiation Oncology | Admitting: Radiation Oncology

## 2020-03-26 ENCOUNTER — Other Ambulatory Visit: Payer: Self-pay

## 2020-03-26 ENCOUNTER — Encounter: Payer: Self-pay | Admitting: General Practice

## 2020-03-26 ENCOUNTER — Encounter: Payer: Self-pay | Admitting: Radiation Oncology

## 2020-03-26 DIAGNOSIS — I10 Essential (primary) hypertension: Secondary | ICD-10-CM | POA: Diagnosis not present

## 2020-03-26 DIAGNOSIS — Z7982 Long term (current) use of aspirin: Secondary | ICD-10-CM | POA: Insufficient documentation

## 2020-03-26 DIAGNOSIS — Z79899 Other long term (current) drug therapy: Secondary | ICD-10-CM | POA: Insufficient documentation

## 2020-03-26 DIAGNOSIS — I6782 Cerebral ischemia: Secondary | ICD-10-CM | POA: Diagnosis not present

## 2020-03-26 DIAGNOSIS — C3432 Malignant neoplasm of lower lobe, left bronchus or lung: Secondary | ICD-10-CM | POA: Insufficient documentation

## 2020-03-26 DIAGNOSIS — F17210 Nicotine dependence, cigarettes, uncomplicated: Secondary | ICD-10-CM | POA: Insufficient documentation

## 2020-03-26 DIAGNOSIS — K219 Gastro-esophageal reflux disease without esophagitis: Secondary | ICD-10-CM | POA: Insufficient documentation

## 2020-03-26 DIAGNOSIS — I2510 Atherosclerotic heart disease of native coronary artery without angina pectoris: Secondary | ICD-10-CM | POA: Insufficient documentation

## 2020-03-26 DIAGNOSIS — E785 Hyperlipidemia, unspecified: Secondary | ICD-10-CM | POA: Diagnosis not present

## 2020-03-26 DIAGNOSIS — J449 Chronic obstructive pulmonary disease, unspecified: Secondary | ICD-10-CM | POA: Insufficient documentation

## 2020-03-26 DIAGNOSIS — I252 Old myocardial infarction: Secondary | ICD-10-CM | POA: Diagnosis not present

## 2020-03-26 DIAGNOSIS — Z87442 Personal history of urinary calculi: Secondary | ICD-10-CM | POA: Insufficient documentation

## 2020-03-26 DIAGNOSIS — J439 Emphysema, unspecified: Secondary | ICD-10-CM | POA: Insufficient documentation

## 2020-03-26 DIAGNOSIS — R69 Illness, unspecified: Secondary | ICD-10-CM | POA: Diagnosis not present

## 2020-03-26 DIAGNOSIS — C3492 Malignant neoplasm of unspecified part of left bronchus or lung: Secondary | ICD-10-CM

## 2020-03-26 DIAGNOSIS — M129 Arthropathy, unspecified: Secondary | ICD-10-CM | POA: Insufficient documentation
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
## 2020-03-26 NOTE — Progress Notes (Signed)
[place] Psychosocial Distress Screening Clinical Social Work  Clinical Social Work was referred by distress screening protocol.  The patient scored a 5 on the Psychosocial Distress Thermometer which indicates moderate distress. Clinical Social Worker did not contact patient as he declined social work referral.  Please reconsult if needs arise.  to assess for distress and other psychosocial needs.   ONCBCN DISTRESS SCREENING 03/26/2020  Screening Type Initial Screening  Distress experienced in past week (1-10) 5  Referral to clinical social work No  Other declines referral    Clinical Social Worker follow up needed: No.  If yes, follow up plan:  Beverely Pace, [place], LCSW Clinical Social Worker Phone:  [phone]

## 2020-04-02 ENCOUNTER — Ambulatory Visit
Admission: RE | Admit: 2020-04-02 | Discharge: 2020-04-02 | Disposition: A | Discharge status: Home / Self Care | Payer: Medicare HMO | Source: Ambulatory Visit | Attending: Radiation Oncology | Admitting: Radiation Oncology

## 2020-04-02 DIAGNOSIS — C3432 Malignant neoplasm of lower lobe, left bronchus or lung: Secondary | ICD-10-CM | POA: Insufficient documentation

## 2020-04-02 DIAGNOSIS — C3492 Malignant neoplasm of unspecified part of left bronchus or lung: Secondary | ICD-10-CM

## 2020-04-02 DIAGNOSIS — Z510 Encounter for antineoplastic radiation therapy: Secondary | ICD-10-CM | POA: Insufficient documentation

## 2020-04-02 DIAGNOSIS — R69 Illness, unspecified: Secondary | ICD-10-CM | POA: Diagnosis not present

## 2020-04-06 DIAGNOSIS — J449 Chronic obstructive pulmonary disease, unspecified: Secondary | ICD-10-CM | POA: Diagnosis not present

## 2020-04-06 DIAGNOSIS — I2510 Atherosclerotic heart disease of native coronary artery without angina pectoris: Secondary | ICD-10-CM | POA: Diagnosis not present

## 2020-04-08 DIAGNOSIS — Z510 Encounter for antineoplastic radiation therapy: Secondary | ICD-10-CM | POA: Diagnosis not present

## 2020-04-08 DIAGNOSIS — R69 Illness, unspecified: Secondary | ICD-10-CM | POA: Diagnosis not present

## 2020-04-08 DIAGNOSIS — C3432 Malignant neoplasm of lower lobe, left bronchus or lung: Secondary | ICD-10-CM | POA: Diagnosis not present

## 2020-04-14 ENCOUNTER — Ambulatory Visit: Payer: Medicare HMO | Admitting: Radiation Oncology

## 2020-04-14 ENCOUNTER — Ambulatory Visit
Admission: RE | Admit: 2020-04-14 | Discharge: 2020-04-14 | Disposition: A | Discharge status: Home / Self Care | Payer: Medicare HMO | Source: Ambulatory Visit | Attending: Radiation Oncology | Admitting: Radiation Oncology

## 2020-04-14 DIAGNOSIS — Z510 Encounter for antineoplastic radiation therapy: Secondary | ICD-10-CM | POA: Diagnosis not present

## 2020-04-14 DIAGNOSIS — C3492 Malignant neoplasm of unspecified part of left bronchus or lung: Secondary | ICD-10-CM

## 2020-04-14 DIAGNOSIS — C3432 Malignant neoplasm of lower lobe, left bronchus or lung: Secondary | ICD-10-CM | POA: Diagnosis not present

## 2020-04-16 ENCOUNTER — Ambulatory Visit
Admission: RE | Admit: 2020-04-16 | Discharge: 2020-04-16 | Disposition: A | Discharge status: Home / Self Care | Payer: Medicare HMO | Source: Ambulatory Visit | Attending: Radiation Oncology | Admitting: Radiation Oncology

## 2020-04-16 DIAGNOSIS — C3492 Malignant neoplasm of unspecified part of left bronchus or lung: Secondary | ICD-10-CM

## 2020-04-16 DIAGNOSIS — C3432 Malignant neoplasm of lower lobe, left bronchus or lung: Secondary | ICD-10-CM | POA: Diagnosis not present

## 2020-04-16 DIAGNOSIS — Z510 Encounter for antineoplastic radiation therapy: Secondary | ICD-10-CM | POA: Diagnosis not present

## 2020-04-18 ENCOUNTER — Other Ambulatory Visit: Payer: Self-pay

## 2020-04-18 ENCOUNTER — Ambulatory Visit: Payer: Medicare HMO | Admitting: Radiation Oncology

## 2020-04-18 ENCOUNTER — Ambulatory Visit
Admission: RE | Admit: 2020-04-18 | Discharge: 2020-04-18 | Disposition: A | Discharge status: Home / Self Care | Payer: Medicare HMO | Source: Ambulatory Visit | Attending: Radiation Oncology | Admitting: Radiation Oncology

## 2020-04-18 DIAGNOSIS — Z510 Encounter for antineoplastic radiation therapy: Secondary | ICD-10-CM | POA: Diagnosis not present

## 2020-04-18 DIAGNOSIS — C3432 Malignant neoplasm of lower lobe, left bronchus or lung: Secondary | ICD-10-CM | POA: Diagnosis not present

## 2020-04-18 DIAGNOSIS — C3492 Malignant neoplasm of unspecified part of left bronchus or lung: Secondary | ICD-10-CM

## 2020-04-21 ENCOUNTER — Ambulatory Visit: Payer: Medicare HMO | Admitting: Radiation Oncology

## 2020-04-22 ENCOUNTER — Other Ambulatory Visit: Payer: Self-pay

## 2020-04-22 ENCOUNTER — Ambulatory Visit
Admission: RE | Admit: 2020-04-22 | Discharge: 2020-04-22 | Disposition: A | Discharge status: Home / Self Care | Payer: Medicare HMO | Source: Ambulatory Visit | Attending: Radiation Oncology | Admitting: Radiation Oncology

## 2020-04-22 DIAGNOSIS — C3432 Malignant neoplasm of lower lobe, left bronchus or lung: Secondary | ICD-10-CM | POA: Diagnosis not present

## 2020-04-22 DIAGNOSIS — Z510 Encounter for antineoplastic radiation therapy: Secondary | ICD-10-CM | POA: Diagnosis not present

## 2020-04-23 ENCOUNTER — Ambulatory Visit: Payer: Medicare HMO | Admitting: Radiation Oncology

## 2020-04-24 ENCOUNTER — Encounter: Payer: Self-pay | Admitting: Radiation Oncology

## 2020-04-24 ENCOUNTER — Ambulatory Visit
Admission: RE | Admit: 2020-04-24 | Discharge: 2020-04-24 | Disposition: A | Discharge status: Home / Self Care | Payer: Medicare HMO | Source: Ambulatory Visit | Attending: Radiation Oncology | Admitting: Radiation Oncology

## 2020-04-24 DIAGNOSIS — R69 Illness, unspecified: Secondary | ICD-10-CM | POA: Diagnosis not present

## 2020-04-24 DIAGNOSIS — C3432 Malignant neoplasm of lower lobe, left bronchus or lung: Secondary | ICD-10-CM | POA: Diagnosis not present

## 2020-04-24 DIAGNOSIS — Z510 Encounter for antineoplastic radiation therapy: Secondary | ICD-10-CM | POA: Diagnosis not present

## 2020-05-07 DIAGNOSIS — I2510 Atherosclerotic heart disease of native coronary artery without angina pectoris: Secondary | ICD-10-CM | POA: Diagnosis not present

## 2020-05-07 DIAGNOSIS — J449 Chronic obstructive pulmonary disease, unspecified: Secondary | ICD-10-CM | POA: Diagnosis not present

## 2020-05-28 NOTE — Progress Notes (Incomplete)
  Patient Name: [name] Graham MRN: [id number] DOB: 1935-05-20 Referring Physician: Derek Jack Date of Service: 04/24/2020 [facility], [place]                                                        End Of Treatment Note  Diagnoses: C34.32-Malignant neoplasm of lower lobe, left bronchus or lung C34.92-Malignant neoplasm of unspecified part of left bronchus or lung  Cancer Staging:  Clinical T1cN0M0 squamous cell carcinoma of the left lower lung  Intent: Curative  Radiation Treatment Dates: 04/14/2020 through 04/24/2020 Site Technique Total Dose (Gy) Dose per Fx (Gy) Completed Fx Beam Energies  Lung, Left: Lung_Lt IMRT 50/50 10 5/5 6XFFF   Narrative: The patient tolerated radiation therapy relatively well. He did report some mild fatigue but denied shortness of breath, cough, difficulty swallowing, and skin irritation.  Plan: The patient will follow-up with radiation oncology in one month.  ________________________________________________   Blair Promise, PhD, MD  This document serves as a record of services personally performed by Gery Pray, MD. It was created on his behalf by Clerance Lav, a trained medical scribe. The creation of this record is based on the scribe's personal observations and the provider's statements to them. This document has been checked and approved by the attending provider.

## 2020-05-28 NOTE — Progress Notes (Signed)
Radiation Oncology         [phone] ________________________________  Name: [name] Graham MRN: [id number]  Date: 05/29/2020  DOB: 01-Jan-1936  Follow-Up Visit Note  CC: [name] Penner, FNP  [name] Jack, MD    ICD-10-CM   1. Squamous cell lung cancer, left ([facility])  C34.92     Diagnosis: Clinical T1cN0M0 squamous cell carcinoma of the left lower lung  Interval Since Last Radiation: One month and four days  Radiation Treatment Dates: 04/14/2020 through 04/24/2020 Site Technique Total Dose (Gy) Dose per Fx (Gy) Completed Fx Beam Energies  Lung, Left: Lung_Lt IMRT 50/50 10 5/5 6XFFF    Narrative:  The patient returns today for routine follow-up. No significant interval history since the end of treatment.  On review of systems, he reports significant pain in the right pelvis radiating into his right leg. He he reports a history of sciatica and frequently receives steroid injections concerning this issue but has been able to get an appointment with his Ortho/rehab doctor concerning this issue. He denies shortness of breath cough or hemoptysis. He denies any changes in his breathing since completion of SBRT.                            ALLERGIES:  is allergic to entresto [sacubitril-valsartan].  Meds: Current Outpatient Medications  Medication Sig Dispense Refill  . albuterol (VENTOLIN HFA) 108 (90 Base) MCG/ACT inhaler Inhale into the lungs every 6 (six) hours as needed for wheezing or shortness of breath.    Marland Kitchen aspirin EC 81 MG tablet Take 1 tablet (81 mg total) by mouth daily. 90 tablet 3  . clopidogrel (PLAVIX) 75 MG tablet Take 1 tablet (75 mg total) by mouth daily. Okay to restart this medication on Thursday, 02/21/2020 90 tablet 3  . Ensure (ENSURE) Take 237 mLs by mouth 2 (two) times daily.    . fluticasone (FLONASE) 50 MCG/ACT nasal spray Place into both nostrils 3 (three) times daily as needed for allergies or rhinitis.    . metoprolol succinate (TOPROL-XL) 25  MG 24 hr tablet Take 0.5 tablets (12.5 mg total) by mouth daily.    . Naphazoline HCl (CLEAR EYES OP) Place 1 drop into both eyes daily as needed (irritation / redness).    . nitroGLYCERIN (NITROSTAT) 0.4 MG SL tablet Place 1 tablet (0.4 mg total) under the tongue every 5 (five) minutes as needed for chest pain.    Marland Kitchen olmesartan (BENICAR) 40 MG tablet Take 1 tablet (40 mg total) by mouth daily. 30 tablet 11  . simvastatin (ZOCOR) 80 MG tablet Take 1 tablet (80 mg total) by mouth at bedtime.    . Tiotropium Bromide Monohydrate (SPIRIVA RESPIMAT) 2.5 MCG/ACT AERS Inhale 2 puffs into the lungs daily. 4 g 11   No current facility-administered medications for this encounter.    Physical Findings: The patient is in no acute distress. Patient is alert and oriented.  height is 5\' 10"  (1.778 m) and weight is 146 lb 3.2 oz (66.3 kg). His temperature is 97.9 F (36.6 C). His blood pressure is 152/79 (abnormal) and his pulse is 68. His respiration is 20 and oxygen saturation is 98%.   Lungs are clear to auscultation bilaterally. Heart has regular rate and rhythm. No palpable cervical, supraclavicular, or axillary adenopathy. Abdomen soft, non-tender, normal bowel sounds.   Lab Findings: Lab Results  Component Value Date   WBC 7.7 02/07/2020   HGB 15.6 02/07/2020  HCT 46.0 02/07/2020   MCV 97.7 02/07/2020   PLT 256 02/07/2020    Radiographic Findings: No results found.  Impression: Clinical T1cN0M0 squamous cell carcinoma of the left lower lung  The patient tolerated SBRT well without any side effects.  Plan: The patient is scheduled to undergo a chest CT scan on 07/09/2020 and then follow up with Dr. Delton Coombes on 07/16/2020. In light of his follow-up with Dr. Delton Coombes have not scheduled him for formal follow-up follow-up appointment but would glad to see him at any time. We discussed what to expect on his initial posttreatment CT scan    ____________________________________   Blair Promise, PhD, MD  This document serves as a record of services personally performed by Gery Pray, MD. It was created on his behalf by Clerance Lav, a trained medical scribe. The creation of this record is based on the scribe's personal observations and the provider's statements to them. This document has been checked and approved by the attending provider.

## 2020-05-29 ENCOUNTER — Ambulatory Visit
Admission: RE | Admit: 2020-05-29 | Discharge: 2020-05-29 | Disposition: A | Discharge status: Home / Self Care | Payer: Medicare HMO | Source: Ambulatory Visit | Attending: Radiation Oncology | Admitting: Radiation Oncology

## 2020-05-29 ENCOUNTER — Other Ambulatory Visit: Payer: Self-pay

## 2020-05-29 DIAGNOSIS — C3432 Malignant neoplasm of lower lobe, left bronchus or lung: Secondary | ICD-10-CM | POA: Diagnosis not present

## 2020-05-29 DIAGNOSIS — Z7982 Long term (current) use of aspirin: Secondary | ICD-10-CM | POA: Insufficient documentation

## 2020-05-29 DIAGNOSIS — Z923 Personal history of irradiation: Secondary | ICD-10-CM | POA: Diagnosis not present

## 2020-05-29 DIAGNOSIS — C3492 Malignant neoplasm of unspecified part of left bronchus or lung: Secondary | ICD-10-CM

## 2020-05-29 DIAGNOSIS — Z79899 Other long term (current) drug therapy: Secondary | ICD-10-CM | POA: Insufficient documentation

## 2020-05-29 DIAGNOSIS — M79604 Pain in right leg: Secondary | ICD-10-CM | POA: Insufficient documentation

## 2020-05-29 MED ORDER — SPIRIVA RESPIMAT 2.5 MCG/ACT IN AERS: 2.0000 | INHALATION_SPRAY | Freq: Every day | RESPIRATORY_TRACT | 11 refills | Status: DC

## 2020-05-29 NOTE — Progress Notes (Signed)
Patient is here today for follow up to left lung radiation completed Dec 2021.  Patient reports severe pain to the right hip/leg.  He states that is keeps him awake at night and limits his movement a lot.  He reports mild shortness of breath.  Denies coughing.  Reports his appetite is sporadic.  Reports his leg pain completely interferes with any activity therefore he has baseline fatigue.  Denies having any swallowing issues.  Vitals:   05/29/20 1028  BP: (!) 152/79  Pulse: 68  Resp: 20  Temp: 97.9 F (36.6 C)  SpO2: 98%  Weight: 146 lb 3.2 oz (66.3 kg)  Height: 5\' 10"  (1.778 m)

## 2020-06-04 ENCOUNTER — Telehealth: Payer: Self-pay | Admitting: Emergency Medicine

## 2020-06-04 MED ORDER — SPIRIVA RESPIMAT 2.5 MCG/ACT IN AERS: 2.0000 | INHALATION_SPRAY | Freq: Every day | RESPIRATORY_TRACT | 11 refills | Status: DC

## 2020-06-04 NOTE — Telephone Encounter (Signed)
Spoke with Janett Billow, pt's grandaughter and she states that [facility] in [place] states that they did not receive rx for Spiriva respimat last week. Rx was entered as a sample. I advised Janett Billow that I sent rx to [facility] in [place]. She verbalized understanding. Nothing further needed.

## 2020-06-07 DIAGNOSIS — J449 Chronic obstructive pulmonary disease, unspecified: Secondary | ICD-10-CM | POA: Diagnosis not present

## 2020-06-07 DIAGNOSIS — I2510 Atherosclerotic heart disease of native coronary artery without angina pectoris: Secondary | ICD-10-CM | POA: Diagnosis not present

## 2020-06-13 MED ORDER — INCRUSE ELLIPTA 62.5 MCG/INH IN AEPB: 1.0000 | INHALATION_SPRAY | Freq: Every day | RESPIRATORY_TRACT | 11 refills | Status: DC

## 2020-06-13 NOTE — Telephone Encounter (Signed)
Spiriva is not covered by insurance   Incruse is covered, Dr. Melvyn Novas  Would you like to change patient to Incruse ?

## 2020-06-13 NOTE — Telephone Encounter (Signed)
Ok to try incruse but pharmacist will need to show them how to use it   rx is one click each am and if not able to use effectively will need ov with me or NP to assure he can master it

## 2020-06-13 NOTE — Telephone Encounter (Signed)
Spoke with the pt and notified of response per Dr Melvyn Novas He verbalized understanding  Nothing further needed

## 2020-06-13 NOTE — Addendum Note (Signed)
Addended by: Rosana Berger on: 06/13/2020 01:20 PM   Modules accepted: Orders

## 2020-07-05 DIAGNOSIS — I2510 Atherosclerotic heart disease of native coronary artery without angina pectoris: Secondary | ICD-10-CM | POA: Diagnosis not present

## 2020-07-05 DIAGNOSIS — J449 Chronic obstructive pulmonary disease, unspecified: Secondary | ICD-10-CM | POA: Diagnosis not present

## 2020-07-09 ENCOUNTER — Other Ambulatory Visit: Payer: Self-pay

## 2020-07-09 ENCOUNTER — Ambulatory Visit (HOSPITAL_COMMUNITY)
Admission: RE | Admit: 2020-07-09 | Discharge: 2020-07-09 | Disposition: A | Discharge status: Home / Self Care | Payer: Medicare HMO | Source: Ambulatory Visit | Attending: Hematology | Admitting: Hematology

## 2020-07-09 ENCOUNTER — Inpatient Hospital Stay (HOSPITAL_COMMUNITY): Payer: Medicare HMO | Attending: Hematology

## 2020-07-09 DIAGNOSIS — I252 Old myocardial infarction: Secondary | ICD-10-CM | POA: Diagnosis not present

## 2020-07-09 DIAGNOSIS — E785 Hyperlipidemia, unspecified: Secondary | ICD-10-CM | POA: Insufficient documentation

## 2020-07-09 DIAGNOSIS — Z7982 Long term (current) use of aspirin: Secondary | ICD-10-CM | POA: Diagnosis not present

## 2020-07-09 DIAGNOSIS — I10 Essential (primary) hypertension: Secondary | ICD-10-CM | POA: Diagnosis not present

## 2020-07-09 DIAGNOSIS — M1990 Unspecified osteoarthritis, unspecified site: Secondary | ICD-10-CM | POA: Insufficient documentation

## 2020-07-09 DIAGNOSIS — F17210 Nicotine dependence, cigarettes, uncomplicated: Secondary | ICD-10-CM | POA: Insufficient documentation

## 2020-07-09 DIAGNOSIS — C3492 Malignant neoplasm of unspecified part of left bronchus or lung: Secondary | ICD-10-CM | POA: Insufficient documentation

## 2020-07-09 DIAGNOSIS — J398 Other specified diseases of upper respiratory tract: Secondary | ICD-10-CM | POA: Diagnosis not present

## 2020-07-09 DIAGNOSIS — K219 Gastro-esophageal reflux disease without esophagitis: Secondary | ICD-10-CM | POA: Insufficient documentation

## 2020-07-09 DIAGNOSIS — I2510 Atherosclerotic heart disease of native coronary artery without angina pectoris: Secondary | ICD-10-CM | POA: Diagnosis not present

## 2020-07-09 DIAGNOSIS — R634 Abnormal weight loss: Secondary | ICD-10-CM | POA: Insufficient documentation

## 2020-07-09 DIAGNOSIS — I255 Ischemic cardiomyopathy: Secondary | ICD-10-CM | POA: Insufficient documentation

## 2020-07-09 DIAGNOSIS — Z79899 Other long term (current) drug therapy: Secondary | ICD-10-CM | POA: Insufficient documentation

## 2020-07-09 DIAGNOSIS — C3490 Malignant neoplasm of unspecified part of unspecified bronchus or lung: Secondary | ICD-10-CM | POA: Diagnosis not present

## 2020-07-09 DIAGNOSIS — Z87442 Personal history of urinary calculi: Secondary | ICD-10-CM | POA: Diagnosis not present

## 2020-07-09 DIAGNOSIS — J439 Emphysema, unspecified: Secondary | ICD-10-CM | POA: Insufficient documentation

## 2020-07-09 DIAGNOSIS — C3432 Malignant neoplasm of lower lobe, left bronchus or lung: Secondary | ICD-10-CM | POA: Diagnosis not present

## 2020-07-09 DIAGNOSIS — Z801 Family history of malignant neoplasm of trachea, bronchus and lung: Secondary | ICD-10-CM | POA: Diagnosis not present

## 2020-07-09 DIAGNOSIS — R69 Illness, unspecified: Secondary | ICD-10-CM | POA: Diagnosis not present

## 2020-07-09 LAB — COMPREHENSIVE METABOLIC PANEL
ALT: 14 U/L (ref 0–44)
AST: 19 U/L (ref 15–41)
Albumin: 3.7 g/dL (ref 3.5–5.0)
Alkaline Phosphatase: 51 U/L (ref 38–126)
Anion gap: 8 (ref 5–15)
BUN: 20 mg/dL (ref 8–23)
CO2: 24 mmol/L (ref 22–32)
Calcium: 9 mg/dL (ref 8.9–10.3)
Chloride: 106 mmol/L (ref 98–111)
Creatinine, Ser: 1.04 mg/dL (ref 0.61–1.24)
GFR, Estimated: >60 mL/min (ref >60)
Glucose, Bld: 122 mg/dL — ABNORMAL HIGH (ref 70–99)
Potassium: 4.2 mmol/L (ref 3.5–5.1)
Sodium: 138 mmol/L (ref 135–145)
Total Bilirubin: 0.7 mg/dL (ref 0.3–1.2)
Total Protein: 7.1 g/dL (ref 6.5–8.1)

## 2020-07-09 LAB — CBC WITH DIFFERENTIAL/PLATELET
Abs Immature Granulocytes: 0.02 10*3/uL (ref 0.00–0.07)
Basophils Absolute: 0.1 10*3/uL (ref 0.0–0.1)
Basophils Relative: 1 %
Eosinophils Absolute: 0.3 10*3/uL (ref 0.0–0.5)
Eosinophils Relative: 5 %
HCT: 46.1 % (ref 39.0–52.0)
Hemoglobin: 15.5 g/dL (ref 13.0–17.0)
Immature Granulocytes: 0 %
Lymphocytes Relative: 24 %
Lymphs Abs: 1.3 10*3/uL (ref 0.7–4.0)
MCH: 32.6 pg (ref 26.0–34.0)
MCHC: 33.6 g/dL (ref 30.0–36.0)
MCV: 96.8 fL (ref 80.0–100.0)
Monocytes Absolute: 0.6 10*3/uL (ref 0.1–1.0)
Monocytes Relative: 11 %
Neutro Abs: 3.2 10*3/uL (ref 1.7–7.7)
Neutrophils Relative %: 59 %
Platelets: 196 10*3/uL (ref 150–400)
RBC: 4.76 MIL/uL (ref 4.22–5.81)
RDW: 13.2 % (ref 11.5–15.5)
WBC: 5.4 10*3/uL (ref 4.0–10.5)
nRBC: 0 % (ref 0.0–0.2)

## 2020-07-09 MED ORDER — IOHEXOL 300 MG/ML  SOLN
75.0000 mL | Freq: Once | INTRAMUSCULAR | Status: AC | PRN
  Administered 2020-07-09: 75 mL via INTRAVENOUS

## 2020-07-15 DIAGNOSIS — I25119 Atherosclerotic heart disease of native coronary artery with unspecified angina pectoris: Secondary | ICD-10-CM | POA: Diagnosis not present

## 2020-07-15 DIAGNOSIS — M1990 Unspecified osteoarthritis, unspecified site: Secondary | ICD-10-CM | POA: Diagnosis not present

## 2020-07-15 DIAGNOSIS — I252 Old myocardial infarction: Secondary | ICD-10-CM | POA: Diagnosis not present

## 2020-07-15 DIAGNOSIS — M5430 Sciatica, unspecified side: Secondary | ICD-10-CM | POA: Diagnosis not present

## 2020-07-15 DIAGNOSIS — J439 Emphysema, unspecified: Secondary | ICD-10-CM | POA: Diagnosis not present

## 2020-07-15 DIAGNOSIS — G8929 Other chronic pain: Secondary | ICD-10-CM | POA: Diagnosis not present

## 2020-07-15 DIAGNOSIS — E785 Hyperlipidemia, unspecified: Secondary | ICD-10-CM | POA: Diagnosis not present

## 2020-07-15 DIAGNOSIS — Z7902 Long term (current) use of antithrombotics/antiplatelets: Secondary | ICD-10-CM | POA: Diagnosis not present

## 2020-07-15 DIAGNOSIS — R69 Illness, unspecified: Secondary | ICD-10-CM | POA: Diagnosis not present

## 2020-07-15 DIAGNOSIS — I10 Essential (primary) hypertension: Secondary | ICD-10-CM | POA: Diagnosis not present

## 2020-07-16 ENCOUNTER — Other Ambulatory Visit: Payer: Self-pay

## 2020-07-16 ENCOUNTER — Inpatient Hospital Stay (HOSPITAL_BASED_OUTPATIENT_CLINIC_OR_DEPARTMENT_OTHER): Payer: Medicare HMO | Admitting: Hematology

## 2020-07-16 VITALS — BP 143/76 | HR 62 | Temp 97.6°F | Resp 17 | Wt 143.5 lb

## 2020-07-16 DIAGNOSIS — R69 Illness, unspecified: Secondary | ICD-10-CM | POA: Diagnosis not present

## 2020-07-16 DIAGNOSIS — R634 Abnormal weight loss: Secondary | ICD-10-CM | POA: Diagnosis not present

## 2020-07-16 DIAGNOSIS — C3492 Malignant neoplasm of unspecified part of left bronchus or lung: Secondary | ICD-10-CM

## 2020-07-16 DIAGNOSIS — M1990 Unspecified osteoarthritis, unspecified site: Secondary | ICD-10-CM | POA: Diagnosis not present

## 2020-07-16 DIAGNOSIS — I255 Ischemic cardiomyopathy: Secondary | ICD-10-CM | POA: Diagnosis not present

## 2020-07-16 DIAGNOSIS — E785 Hyperlipidemia, unspecified: Secondary | ICD-10-CM | POA: Diagnosis not present

## 2020-07-16 DIAGNOSIS — I252 Old myocardial infarction: Secondary | ICD-10-CM | POA: Diagnosis not present

## 2020-07-16 DIAGNOSIS — K219 Gastro-esophageal reflux disease without esophagitis: Secondary | ICD-10-CM | POA: Diagnosis not present

## 2020-07-16 DIAGNOSIS — I10 Essential (primary) hypertension: Secondary | ICD-10-CM | POA: Diagnosis not present

## 2020-07-16 DIAGNOSIS — J439 Emphysema, unspecified: Secondary | ICD-10-CM | POA: Diagnosis not present

## 2020-07-16 DIAGNOSIS — C3432 Malignant neoplasm of lower lobe, left bronchus or lung: Secondary | ICD-10-CM | POA: Diagnosis not present

## 2020-07-16 NOTE — Patient Instructions (Signed)
[place] at [facility] Discharge Instructions  You were seen today by Dr. Delton Coombes. He went over your recent results and scans. You will be scheduled to have another CT scan of your chest before your next visit. Dr. Delton Coombes will see you back in 4 months for labs and follow up.   Thank you for choosing [place] at [facility] to provide your oncology and hematology care.  To afford each patient quality time with our provider, please arrive at least 15 minutes before your scheduled appointment time.   If you have a lab appointment with the [place] [place] please come in thru the Main Entrance and check in at the main information desk  You need to re-schedule your appointment should you arrive 10 or more minutes late.  We strive to give you quality time with our providers, and arriving late affects you and other patients whose appointments are after yours.  Also, if you no show three or more times for appointments you may be dismissed from the clinic at the providers discretion.     Again, thank you for choosing [facility].  Our hope is that these requests will decrease the amount of time that you wait before being seen by our physicians.       _____________________________________________________________  Should you have questions after your visit to [facility], please contact our office at [phone] between the hours of 8:00 a.m. and 4:30 p.m.  Voicemails left after 4:00 p.m. will not be returned until the following business day.  For prescription refill requests, have your pharmacy contact our office and allow 72 hours.    Cancer Center Support Programs:   > Cancer Support Group  2nd Tuesday of the month 1pm-2pm, Journey Room

## 2020-07-16 NOTE — Progress Notes (Signed)
[name], [place] [address]   CLINIC:  Medical Oncology/Hematology  PCP:  Lysbeth Penner, [place] / [place] [address] [phone]   REASON FOR VISIT:  Follow-up for left squamous cell lung cancer  PRIOR THERAPY: Left lung SBRT 50 Gy in 5 fractions from 04/14/2020 to 04/24/2020  NGS Results: Not done  CURRENT THERAPY: Surveillance  BRIEF ONCOLOGIC HISTORY:  Oncology History   No history exists.    CANCER STAGING: Cancer Staging No matching staging information was found for the patient.  INTERVAL HISTORY:  [name] Graham, a 85 y.o. male, returns for routine follow-up of his left squamous cell lung cancer. [name] Graham was last seen on 03/11/2020.   Today he reports feeling okay. His energy level is decreased but he is managing it well. His appetite is excellent and he is not diabetic. He denies having any new cough or SOB. He tolerated the radiation in December well. He uses oxygen in the morning and in the evening but not overnight, and he checks his pulse ox daily and notes that his oxygen sats run from 96 to 98%.   REVIEW OF SYSTEMS:  Review of Systems  Constitutional: Positive for fatigue (25%). Negative for appetite change.  HENT:   Positive for hearing loss.   Respiratory: Negative for cough and shortness of breath.   Musculoskeletal: Positive for arthralgias (8/10 hip pain).  All other systems reviewed and are negative.   PAST MEDICAL/SURGICAL HISTORY:  Past Medical History:  Diagnosis Date  . Arthritis   . COPD (chronic obstructive pulmonary disease) ([place])   . Coronary atherosclerosis of native coronary artery    BMS circ and PTCA PDA 2007, DES RCA 10/09, DES LAD 2/12  . Dyspnea    PULMONARY NODULE   . Essential hypertension   . GERD (gastroesophageal reflux disease)   . Hearing loss    no hearing aids  . Heart attack ([place])    x 3 (latest 2009)  . History of kidney stones    passed  stones  . Hyperlipidemia   . Ischemic cardiomyopathy   . NSTEMI (non-ST elevated myocardial infarction) ([place])    2007  . Pulmonary nodule 1 cm or greater in diameter 02/2020   Dr Lamonte Sakai   Past Surgical History:  Procedure Laterality Date  . ANGIOPLASTY     stent  . BRONCHIAL BIOPSY  02/19/2020   Procedure: BRONCHIAL BIOPSIES;  Surgeon: Collene Gobble, MD;  Location: [facility] ENDOSCOPY;  Service: Pulmonary;;  . BRONCHIAL BRUSHINGS  02/19/2020   Procedure: BRONCHIAL BRUSHINGS;  Surgeon: Collene Gobble, MD;  Location: [facility] ENDOSCOPY;  Service: Pulmonary;;  . BRONCHIAL NEEDLE ASPIRATION BIOPSY  02/19/2020   Procedure: BRONCHIAL NEEDLE ASPIRATION BIOPSIES;  Surgeon: Collene Gobble, MD;  Location: [facility] ENDOSCOPY;  Service: Pulmonary;;  . COLONOSCOPY    . FIDUCIAL MARKER PLACEMENT  02/19/2020   Procedure: FIDUCIAL MARKER PLACEMENT;  Surgeon: Collene Gobble, MD;  Location: [facility] ENDOSCOPY;  Service: Pulmonary;;  . HERNIA REPAIR    . LIPOMA EXCISION Right    hip/buttox  . TONSILLECTOMY     as a child  . VIDEO BRONCHOSCOPY WITH ENDOBRONCHIAL NAVIGATION N/A 02/19/2020   Procedure: VIDEO BRONCHOSCOPY WITH ENDOBRONCHIAL NAVIGATION;  Surgeon: Collene Gobble, MD;  Location: [facility] ENDOSCOPY;  Service: Pulmonary;  Laterality: N/A;    SOCIAL HISTORY:  Social History   Socioeconomic History  . Marital status: Divorced    Spouse name: Not on  file  . Number of children: Not on file  . Years of education: Not on file  . Highest education level: Not on file  Occupational History  . Occupation: Retired  Tobacco Use  . Smoking status: Current Every Day Smoker    Packs/day: 0.50    Years: 63.00    Pack years: 31.50    Types: Cigarettes    Start date: 06/15/1953  . Smokeless tobacco: Never Used  . Tobacco comment: 4 cigarettes smoked daily 03/05/20 [facility]   Vaping Use  . Vaping Use: Never used  Substance and Sexual Activity  . Alcohol use: No    Alcohol/week: 0.0 standard drinks  . Drug use: No  . Sexual  activity: Not Currently  Other Topics Concern  . Not on file  Social History Narrative  . Not on file   Social Determinants of Health   Financial Resource Strain: Low Risk   . Difficulty of Paying Living Expenses: Not hard at all  Food Insecurity: No Food Insecurity  . Worried About Charity fundraiser in the Last Year: Never true  . Ran Out of Food in the Last Year: Never true  Transportation Needs: No Transportation Needs  . Lack of Transportation (Medical): No  . Lack of Transportation (Non-Medical): No  Physical Activity: Insufficiently Active  . Days of Exercise per Week: 7 days  . Minutes of Exercise per Session: 10 min  Stress: Stress Concern Present  . Feeling of Stress : Very much  Social Connections: Socially Isolated  . Frequency of Communication with Friends and Family: More than three times a week  . Frequency of Social Gatherings with Friends and Family: More than three times a week  . Attends Religious Services: Never  . Active Member of Clubs or Organizations: No  . Attends Archivist Meetings: Never  . Marital Status: Divorced  Human resources officer Violence: Not At Risk  . Fear of Current or Ex-Partner: No  . Emotionally Abused: No  . Physically Abused: No  . Sexually Abused: No    FAMILY HISTORY:  Family History  Problem Relation Age of Onset  . Throat cancer Father   . Heart attack Mother   . Alzheimer's disease Mother     CURRENT MEDICATIONS:  Current Outpatient Medications  Medication Sig Dispense Refill  . albuterol (VENTOLIN HFA) 108 (90 Base) MCG/ACT inhaler Inhale into the lungs every 6 (six) hours as needed for wheezing or shortness of breath.    Marland Kitchen aspirin EC 81 MG tablet Take 1 tablet (81 mg total) by mouth daily. 90 tablet 3  . clopidogrel (PLAVIX) 75 MG tablet Take 1 tablet (75 mg total) by mouth daily. Okay to restart this medication on Thursday, 02/21/2020 90 tablet 3  . Ensure (ENSURE) Take 237 mLs by mouth 2 (two) times daily.     . fluticasone (FLONASE) 50 MCG/ACT nasal spray Place into both nostrils 3 (three) times daily as needed for allergies or rhinitis.    . metoprolol succinate (TOPROL-XL) 25 MG 24 hr tablet Take 0.5 tablets (12.5 mg total) by mouth daily.    . Naphazoline HCl (CLEAR EYES OP) Place 1 drop into both eyes daily as needed (irritation / redness).    . nitroGLYCERIN (NITROSTAT) 0.4 MG SL tablet Place 1 tablet (0.4 mg total) under the tongue every 5 (five) minutes as needed for chest pain.    Marland Kitchen olmesartan (BENICAR) 40 MG tablet Take 1 tablet (40 mg total) by mouth daily. 30 tablet 11  .  simvastatin (ZOCOR) 80 MG tablet Take 1 tablet (80 mg total) by mouth at bedtime.    . Tiotropium Bromide Monohydrate (SPIRIVA RESPIMAT) 2.5 MCG/ACT AERS Inhale 2 puffs into the lungs daily. 4 g 11  . umeclidinium bromide (INCRUSE ELLIPTA) 62.5 MCG/INH AEPB Inhale 1 puff into the lungs daily. 30 each 11   No current facility-administered medications for this visit.    ALLERGIES:  Allergies  Allergen Reactions  . Entresto [Sacubitril-Valsartan] Itching    PHYSICAL EXAM:  Performance status (ECOG): 2 - Symptomatic, <50% confined to bed  Vitals:   07/16/20 1059  BP: (!) 143/76  Pulse: 62  Resp: 17  Temp: 97.6 F (36.4 C)  SpO2: 96%   Wt Readings from Last 3 Encounters:  07/16/20 143 lb 8 oz (65.1 kg)  05/29/20 146 lb 3.2 oz (66.3 kg)  03/26/20 142 lb 8 oz (64.6 kg)   Physical Exam Vitals reviewed.  Constitutional:      Appearance: Normal appearance.  Cardiovascular:     Rate and Rhythm: Normal rate and regular rhythm.     Pulses: Normal pulses.     Heart sounds: Normal heart sounds.  Pulmonary:     Effort: Pulmonary effort is normal.     Breath sounds: Normal breath sounds.  Musculoskeletal:     Right lower leg: No edema.     Left lower leg: No edema.  Neurological:     General: No focal deficit present.     Mental Status: He is alert and oriented to person, place, and time.  Psychiatric:         Mood and Affect: Mood normal.        Behavior: Behavior normal.      LABORATORY DATA:  I have reviewed the labs as listed.  CBC Latest Ref Rng & Units 07/09/2020 02/07/2020 10/09/2018  WBC 4.0 - 10.5 K/uL 5.4 7.7 10.0  Hemoglobin 13.0 - 17.0 g/dL 15.5 15.6 15.9  Hematocrit 39.0 - 52.0 % 46.1 46.0 46.4  Platelets 150 - 400 K/uL 196 256 208   CMP Latest Ref Rng & Units 07/09/2020 02/07/2020 10/09/2018  Glucose 70 - 99 mg/dL 122(H) 96 102(H)  BUN 8 - 23 mg/dL 20 21 22   Creatinine 0.61 - 1.24 mg/dL 1.04 1.08 1.20  Sodium 135 - 145 mmol/L 138 135 139  Potassium 3.5 - 5.1 mmol/L 4.2 4.5 4.7  Chloride 98 - 111 mmol/L 106 102 104  CO2 22 - 32 mmol/L 24 25 23   Calcium 8.9 - 10.3 mg/dL 9.0 9.4 8.9  Total Protein 6.5 - 8.1 g/dL 7.1 7.5 7.2  Total Bilirubin 0.3 - 1.2 mg/dL 0.7 0.6 0.6  Alkaline Phos 38 - 126 U/L 51 45 48  AST 15 - 41 U/L 19 18 17   ALT 0 - 44 U/L 14 14 14     DIAGNOSTIC IMAGING:  I have independently reviewed the scans and discussed with the patient. CT Chest W Contrast  Result Date: 07/10/2020 CLINICAL DATA:  Non-small cell lung cancer, assess treatment response EXAM: CT CHEST WITH CONTRAST TECHNIQUE: Multidetector CT imaging of the chest was performed during intravenous contrast administration. CONTRAST:  78mL OMNIPAQUE IOHEXOL 300 MG/ML  SOLN COMPARISON:  02/08/2020 FINDINGS: Cardiovascular: Severe aortic atherosclerosis. Extensive 3 vessel coronary artery calcifications and/or stents. Normal heart size. No pericardial effusion. Mediastinum/Nodes: Unchanged prominent pretracheal and bilateral hilar lymph nodes, largest pretracheal nodes measuring up to 2.3 x 1.4 cm (series 2, image 74). Frothy debris in the lower trachea. Thyroid gland and esophagus demonstrate  no significant findings. Lungs/Pleura: Severe emphysema. Diffuse bilateral bronchial wall thickening. Frothy debris in the dependent airways bilaterally. Biapical pleuroparenchymal scarring. Significant interval decrease in  size of an infrahilar mass of the left lower lobe, measuring 1.8 x 1.5 cm, previously 3.0 x 2.4 cm (series 4, image 120). Stable subpleural consolidation or nodule of the peripheral left lung base measuring 1.3 x 0.9 cm (series 4, image 146). No pleural effusion or pneumothorax. Upper Abdomen: No acute abnormality. Musculoskeletal: No chest wall mass or suspicious bone lesions identified. IMPRESSION: 1. Significant interval decrease in size of an infrahilar mass of the left lower lobe, consistent with treatment response. 2. Stable subpleural consolidation or nodule of the peripheral left lung base measuring 1.3 x 0.9 cm, not previously FDG avid and likely infectious or inflammatory in nature. Attention on follow-up. 3. Unchanged prominent pretracheal and bilateral hilar lymph nodes, largest pretracheal nodes measuring up to 2.3 x 1.4 cm, previously demonstrating low level FDG avidity. 4. Severe emphysema 5. Diffuse bilateral bronchial wall thickening and frothy debris in the dependent airways bilaterally. Findings may reflect bronchitic secretions or alternately aspiration. 6. Coronary artery disease. Aortic Atherosclerosis (ICD10-I70.0) and Emphysema (ICD10-J43.9). Electronically Signed   By: Eddie Candle M.D.   On: 07/10/2020 08:42     ASSESSMENT:  1. 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 -Patient placed on O2 nasal cannula in the last 3 weeks for drop in sats on exertion. -PET scan on 01/28/2020 shows 2.6 cm left lower lobe nodule SUV 10.1. Focal hypermetabolic them in the left hilum identified without discrete lymph node visible on CT. 10 mm short axis precarinal lymph node with low-level SUV 2.9. 7 mm short axis right hilar node with SUV 3.8. -Navigational bronchoscopy and biopsy of the left lower lobe lung lesion consistent with squamous cell carcinoma. -IMRT to the left lower lung mass from  04/14/2020 through 04/24/2020, 50 Gy/ 5 fractions.  2. Social/family history: -2 pack/day smoker for 68 years. Currently smokes 4 cigarettes/day. 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 -Father had throat cancer.   PLAN:  1.Clinical T1c N0 M0 squamous cell carcinoma of left lung lower lobe: -He has completed IMRT to the left lower lung mass in December 2021. -He is doing well and denies any cough or hemoptysis. -Reviewed CT chest from 07/09/2020 which showed significant decrease in the infrahilar mass in the left lower lobe.  Stable subpleural consolidation/nodule in the peripheral left lung base.  Unchanged prominent pretracheal and bilateral hilar lymph nodes. -Recommend follow-up in 4 months with repeat CT scan.   Orders placed this encounter:  No orders of the defined types were placed in this encounter.    Derek Jack, MD [place] [phone]   I, Milinda Antis, am acting as a scribe for Dr. Sanda Linger.  I, Derek Jack MD, have reviewed the above documentation for accuracy and completeness, and I agree with the above.

## 2020-08-05 DIAGNOSIS — J449 Chronic obstructive pulmonary disease, unspecified: Secondary | ICD-10-CM | POA: Diagnosis not present

## 2020-08-05 DIAGNOSIS — I2510 Atherosclerotic heart disease of native coronary artery without angina pectoris: Secondary | ICD-10-CM | POA: Diagnosis not present

## 2020-08-07 ENCOUNTER — Other Ambulatory Visit: Payer: Self-pay | Admitting: Student

## 2020-08-11 ENCOUNTER — Other Ambulatory Visit: Payer: Self-pay | Admitting: Cardiology

## 2020-08-18 ENCOUNTER — Telehealth: Payer: Self-pay | Admitting: Cardiology

## 2020-08-18 DIAGNOSIS — G894 Chronic pain syndrome: Secondary | ICD-10-CM | POA: Diagnosis not present

## 2020-08-18 DIAGNOSIS — Z79891 Long term (current) use of opiate analgesic: Secondary | ICD-10-CM | POA: Diagnosis not present

## 2020-08-18 DIAGNOSIS — M47816 Spondylosis without myelopathy or radiculopathy, lumbar region: Secondary | ICD-10-CM | POA: Diagnosis not present

## 2020-08-18 DIAGNOSIS — M5136 Other intervertebral disc degeneration, lumbar region: Secondary | ICD-10-CM | POA: Diagnosis not present

## 2020-08-18 NOTE — Telephone Encounter (Signed)
Addendum: Type of procedure: right L4-5 TFESI and C ARM

## 2020-08-18 NOTE — Telephone Encounter (Signed)
   [facility] Pre-operative Risk Assessment    Patient Name: [name] Graham  DOB: 1935-12-23  MRN: [id number]   HEARTCARE STAFF: - Please ensure there is not already an duplicate clearance open for this procedure. - Under Visit Info/Reason for Call, type in Other and utilize the format Clearance MM/DD/YY or Clearance TBD. Do not use dashes or single digits. - If request is for dental extraction, please clarify the # of teeth to be extracted.  Request for surgical clearance:  1. What type of surgery is being performed? Right L4-5 T  2. When is this surgery scheduled? TBD  3. What type of clearance is required (medical clearance vs. Pharmacy clearance to hold med vs. Both)? Pharmacy Clearance  4. Are there any medications that need to be held prior to surgery and how long? Plavix, 7days  5. Practice name and name of physician performing surgery? [facility] in [place], Dr. Francesco Runner  6. What is the office phone number? [phone]   7.   What is the office fax number? [phone]  8.   Anesthesia type (None, local, MAC, general) ? Not indicated on fax   Alvin Critchley 08/18/2020, 3:25 PM  _________________________________________________________________   (provider comments below)

## 2020-08-18 NOTE — Telephone Encounter (Signed)
Yes, Plavix may be held as requested.

## 2020-08-18 NOTE — Telephone Encounter (Signed)
Patient is currently not at home, I spoke with the patient's daughter, he has been doing well after the recent radiation therapy for lung cancer.  He has been able to blow leaves in the backyard without any exertional discomfort.  He did not have any recent chest pain or shortness of breath.  He has a history of COPD, CAD and hypertension.  Dr. Domenic Polite to review, would he be okay from your perspective for the patient to come off of Plavix for 7 days prior to the back injection?  Please forward her response to P [facility] PREOP  Thank you

## 2020-08-19 NOTE — Telephone Encounter (Signed)
Left a message for the patient to call back and speak to the on-call preop APP of the day 

## 2020-08-19 NOTE — Telephone Encounter (Signed)
   Name: [name] Graham  DOB: 28-Jan-1936  MRN: [id number]   Primary Cardiologist: Rozann Lesches, MD  Chart reviewed as part of pre-operative protocol coverage. Patient was contacted 08/19/2020 in reference to pre-operative risk assessment for pending surgery as outlined below.  [name] Graham was last seen on 01/25/2020 by Dr. Domenic Polite.  Since that day, [name] Graham has done well without any chest pain or worsening dyspnea.  Therefore, based on ACC/AHA guidelines, the patient would be at acceptable risk for the planned procedure without further cardiovascular testing.   He has been cleared to hold Plavix for 7 days prior to the back injection and restart as soon as possible afterward at the surgeon's discretion.  The patient was advised that if he develops new symptoms prior to surgery to contact our office to arrange for a follow-up visit, and he verbalized understanding.  I will route this recommendation to the requesting party via Epic fax function and remove from pre-op pool. Please call with questions.  [place], [place] 08/19/2020, 2:37 PM

## 2020-09-01 ENCOUNTER — Other Ambulatory Visit: Payer: Self-pay

## 2020-09-01 ENCOUNTER — Other Ambulatory Visit: Payer: Self-pay | Admitting: Cardiology

## 2020-09-01 ENCOUNTER — Encounter: Payer: Self-pay | Admitting: Physician Assistant

## 2020-09-01 ENCOUNTER — Other Ambulatory Visit: Payer: Self-pay | Admitting: Physician Assistant

## 2020-09-01 ENCOUNTER — Ambulatory Visit (INDEPENDENT_AMBULATORY_CARE_PROVIDER_SITE_OTHER): Payer: Medicare HMO | Admitting: Physician Assistant

## 2020-09-01 ENCOUNTER — Ambulatory Visit (INDEPENDENT_AMBULATORY_CARE_PROVIDER_SITE_OTHER): Payer: Medicare HMO

## 2020-09-01 VITALS — BP 144/78 | HR 66 | Ht 70.0 in | Wt 146.6 lb

## 2020-09-01 DIAGNOSIS — C3492 Malignant neoplasm of unspecified part of left bronchus or lung: Secondary | ICD-10-CM | POA: Diagnosis not present

## 2020-09-01 DIAGNOSIS — R42 Dizziness and giddiness: Secondary | ICD-10-CM

## 2020-09-01 DIAGNOSIS — J449 Chronic obstructive pulmonary disease, unspecified: Secondary | ICD-10-CM | POA: Diagnosis not present

## 2020-09-01 DIAGNOSIS — I2510 Atherosclerotic heart disease of native coronary artery without angina pectoris: Secondary | ICD-10-CM | POA: Diagnosis not present

## 2020-09-01 DIAGNOSIS — I5042 Chronic combined systolic (congestive) and diastolic (congestive) heart failure: Secondary | ICD-10-CM

## 2020-09-01 DIAGNOSIS — I10 Essential (primary) hypertension: Secondary | ICD-10-CM

## 2020-09-01 DIAGNOSIS — I255 Ischemic cardiomyopathy: Secondary | ICD-10-CM

## 2020-09-01 MED ORDER — AMLODIPINE BESYLATE 5 MG PO TABS: 5.0000 mg | ORAL_TABLET | Freq: Every day | ORAL | 3 refills | Status: DC

## 2020-09-01 NOTE — Progress Notes (Signed)
Cardiology Office Note    Date:  09/01/2020   ID:  ATANACIO MELNYK, DOB [birth year], MRN [id number]   PCP:  Lysbeth Penner, [place]  Cardiologist:  Rozann Lesches, MD  Advanced Practice Provider:  No care team member to display Electrophysiologist:  None   [id number]   Chief Complaint  Patient presents with  . Dizziness    History of Present Illness:  [name] Graham is a 85 y.o. male with history of CAD status post BMS to the circumflex and PTCA to PDA in 2007, DES to RCA 2009, most recent DES to the LAD 2012, ischemic cardiomyopathy LVEF 35 to 40% with high risk Myoview 05/2018.  Patient declined cardiac catheterization and does not want to pursue ICD evaluation, chronic systolic and diastolic CHF essential hypertension, COPD, left lung squamous cell CA treated with radiation.  Has been maintained on Plavix, Toprol Benicar and Zocor.  Patient added onto my schedule for BP problems and dizziness. Last Thurs while driving he got dizzy and pulled over and lasted 4-5 min. Friday again while driving he became dizzy and sat for a few minutes. He was short of breath and didn't have his O2 with him.  Daughter checked BP and it was 180/?.  Eating a lot of frozen dinners, canned meats. Chronic DOE unchanged. No chest pain. Smoking 8 cigarettes daily.  No chest pain, edema. No dizziness when changing positions. Denies palpitations.  On O2 1 1/2 hrs a day.     Past Medical History:  Diagnosis Date  . Arthritis   . COPD (chronic obstructive pulmonary disease) ([place])   . Coronary atherosclerosis of native coronary artery    BMS circ and PTCA PDA 2007, DES RCA 10/09, DES LAD 2/12  . Dyspnea    PULMONARY NODULE   . Essential hypertension   . GERD (gastroesophageal reflux disease)   . Hearing loss    no hearing aids  . Heart attack ([place])    x 3 (latest 2009)  . History of kidney stones    passed stones  . Hyperlipidemia   . Ischemic cardiomyopathy    . NSTEMI (non-ST elevated myocardial infarction) ([place])    2007  . Pulmonary nodule 1 cm or greater in diameter 02/2020   Dr Lamonte Sakai    Past Surgical History:  Procedure Laterality Date  . ANGIOPLASTY     stent  . BRONCHIAL BIOPSY  02/19/2020   Procedure: BRONCHIAL BIOPSIES;  Surgeon: Collene Gobble, MD;  Location: [facility] ENDOSCOPY;  Service: Pulmonary;;  . BRONCHIAL BRUSHINGS  02/19/2020   Procedure: BRONCHIAL BRUSHINGS;  Surgeon: Collene Gobble, MD;  Location: [facility] ENDOSCOPY;  Service: Pulmonary;;  . BRONCHIAL NEEDLE ASPIRATION BIOPSY  02/19/2020   Procedure: BRONCHIAL NEEDLE ASPIRATION BIOPSIES;  Surgeon: Collene Gobble, MD;  Location: [place] ENDOSCOPY;  Service: Pulmonary;;  . COLONOSCOPY    . FIDUCIAL MARKER PLACEMENT  02/19/2020   Procedure: FIDUCIAL MARKER PLACEMENT;  Surgeon: Collene Gobble, MD;  Location: [facility] ENDOSCOPY;  Service: Pulmonary;;  . HERNIA REPAIR    . LIPOMA EXCISION Right    hip/buttox  . TONSILLECTOMY     as a child  . VIDEO BRONCHOSCOPY WITH ENDOBRONCHIAL NAVIGATION N/A 02/19/2020   Procedure: VIDEO BRONCHOSCOPY WITH ENDOBRONCHIAL NAVIGATION;  Surgeon: Collene Gobble, MD;  Location: [facility] ENDOSCOPY;  Service: Pulmonary;  Laterality: N/A;    Current Medications: Current Meds  Medication Sig  . amLODipine (NORVASC) 5 MG tablet Take 1 tablet (  5 mg total) by mouth daily.  Marland Kitchen aspirin EC 81 MG tablet Take 1 tablet (81 mg total) by mouth daily.  . clopidogrel (PLAVIX) 75 MG tablet Take 1 tablet by mouth once daily  . Ensure (ENSURE) Take 237 mLs by mouth 2 (two) times daily.  . fluticasone (FLONASE) 50 MCG/ACT nasal spray Place into both nostrils 3 (three) times daily as needed for allergies or rhinitis.  Marland Kitchen HYDROcodone-acetaminophen (NORCO) 7.5-325 MG tablet Take 1 tablet by mouth daily as needed.  . metoprolol succinate (TOPROL-XL) 25 MG 24 hr tablet Take 0.5 tablets (12.5 mg total) by mouth daily.  . Naphazoline HCl (CLEAR EYES OP) Place 1 drop into both eyes daily as  needed (irritation / redness).  . nitroGLYCERIN (NITROSTAT) 0.4 MG SL tablet DISSOLVE ONE TABLET UNDER THE TONGUE EVERY 5 MINUTES AS NEEDED FOR CHEST PAIN.  DO NOT EXCEED A TOTAL OF 3 DOSES IN 15 MINUTES  . olmesartan (BENICAR) 40 MG tablet Take 1 tablet (40 mg total) by mouth daily.  . simvastatin (ZOCOR) 80 MG tablet Take 1 tablet (80 mg total) by mouth at bedtime.  Marland Kitchen umeclidinium bromide (INCRUSE ELLIPTA) 62.5 MCG/INH AEPB Inhale 1 puff into the lungs daily.     Allergies:   Entresto [sacubitril-valsartan]   Social History   Socioeconomic History  . Marital status: Divorced    Spouse name: Not on file  . Number of children: Not on file  . Years of education: Not on file  . Highest education level: Not on file  Occupational History  . Occupation: Retired  Tobacco Use  . Smoking status: Current Every Day Smoker    Packs/day: 0.50    Years: 63.00    Pack years: 31.50    Types: Cigarettes    Start date: 06/15/1953  . Smokeless tobacco: Never Used  . Tobacco comment: 4 cigarettes smoked daily 03/05/20 ARJ   Vaping Use  . Vaping Use: Never used  Substance and Sexual Activity  . Alcohol use: No    Alcohol/week: 0.0 standard drinks  . Drug use: No  . Sexual activity: Not Currently  Other Topics Concern  . Not on file  Social History Narrative  . Not on file   Social Determinants of Health   Financial Resource Strain: Low Risk   . Difficulty of Paying Living Expenses: Not hard at all  Food Insecurity: No Food Insecurity  . Worried About Charity fundraiser in the Last Year: Never true  . Ran Out of Food in the Last Year: Never true  Transportation Needs: No Transportation Needs  . Lack of Transportation (Medical): No  . Lack of Transportation (Non-Medical): No  Physical Activity: Insufficiently Active  . Days of Exercise per Week: 7 days  . Minutes of Exercise per Session: 10 min  Stress: Stress Concern Present  . Feeling of Stress : Very much  Social Connections:  Socially Isolated  . Frequency of Communication with Friends and Family: More than three times a week  . Frequency of Social Gatherings with Friends and Family: More than three times a week  . Attends Religious Services: Never  . Active Member of Clubs or Organizations: No  . Attends Archivist Meetings: Never  . Marital Status: Divorced     Family History:  The patient's family history includes Alzheimer's disease in his mother; Heart attack in his mother; Throat cancer in his father.   ROS:   Please see the history of present illness.    ROS  All other systems reviewed and are negative.   PHYSICAL EXAM:   VS:  BP (!) 144/78   Pulse 66   Ht 5\' 10"  (1.778 m)   Wt 146 lb 9.6 oz (66.5 kg)   BMI 21.03 kg/m   Physical Exam  GEN: Thin, in no acute distress  Neck: no JVD, carotid bruits, or masses Cardiac:RRR;some skipping, 2/6 systolic murmur LSB Respiratory: Decreased breath sounds throughout GI: soft, nontender, nondistended, + BS Ext: without cyanosis, clubbing, or edema, Good distal pulses bilaterally Neuro:  Alert and Oriented x 3 Psych: euthymic mood, full affect  Wt Readings from Last 3 Encounters:  09/01/20 146 lb 9.6 oz (66.5 kg)  07/16/20 143 lb 8 oz (65.1 kg)  05/29/20 146 lb 3.2 oz (66.3 kg)      Studies/Labs Reviewed:   EKG:  EKG is  ordered today.  The ekg ordered today demonstrates NSR with RBBB/LAFB-bifascicular block unchanged.  Recent Labs: 07/09/2020: ALT 14; BUN 20; Creatinine, Ser 1.04; Hemoglobin 15.5; Platelets 196; Potassium 4.2; Sodium 138   Lipid Panel    Component Value Date/Time   CHOL 138 05/09/2014 0734   CHOL 144 06/15/2013 0931   TRIG 92 05/09/2014 0734   HDL 55 05/09/2014 0734   HDL 61 06/15/2013 0931   CHOLHDL 2.5 05/09/2014 0734   VLDL 18 05/09/2014 0734   LDLCALC 65 05/09/2014 0734   LDLCALC 69 06/15/2013 0931    Additional studies/ records that were reviewed today include:  Lexiscan Myoview 06/02/2018:  Defect 1:  There is a large defect of moderate severity present in the mid anteroseptal, mid inferoseptal, mid inferior, apical septal and apical inferior location.  Findings consistent with prior myocardial infarction with a mild degree of inferior/inferoseptal peri-infarct ischemia.  This is a high risk study primarily due to the combination of severe LV dysfunction and degree of myocardial scar/peri-infarct ischemia.  Nuclear stress EF: 35%.  LAFB and nonspecific IVCD seen throughout study with occasional PAC's and PVC's with stress.  Echocardiogram 06/07/2018:  1. The left ventricle has moderately reduced systolic function of 93-81%. The cavity size is mildly increased. There is mildly increased left ventricular wall thickness of the septal wall. Echo evidence of impaired diastolic relaxation.  2. There is akinesis of the apical inferior left ventricular segment.  3. The right ventricle has normal systolic function. The cavity in normal in size. There is no increase in right ventricular wall thickness. RVSP estimated 23 mmHg.  4. The aortic valve is tricuspid. There is mild calcification of the aortic valve. There is mild aortic annular calcification noted.  5. The mitral valve is normal in structure There is mild calcification.  6. The aortic root is normal in size and structure.  7. The pulmonic valve is grossly normal.      Risk Assessment/Calculations:         ASSESSMENT:    1. Dizziness   2. Coronary artery disease involving native coronary artery of native heart without angina pectoris   3. Ischemic cardiomyopathy   4. Chronic combined systolic and diastolic CHF (congestive heart failure) ([place])   5. Essential hypertension   6. COPD  GOLD II/ still smoking    7. Squamous cell lung cancer, left (H[facility])      PLAN:  In order of problems listed above:  Dizziness occurred twice while driving and lasted 4-5 min. No chest pain, palpitations, not orthostatic here. BP running high at home  and didn't use his O2(was also SOB).  With history of  ICM need to rule out arrhythmia. EKG unchanged.labs stable 07/09/20.  Will place 2 week monitor, check echo.  CAD- he is s/p BMS to LCx and PTCA to PDA in 2007, DES to RCA in 2009 and DES to LAD in 2012, with NST in 05/2018 showing findings consistent with prior myocardial infarction with a mild degree of inferior/inferoseptal peri-infarct ischemia as outlined above. No angina  Ischemic cardiomyopathy ejection fraction 35 to 40% on echo 06/07/2018 has declined evaluation for possible ICD. Repeat echo.  Chronic systolic and diastolic CHF compensated  Essential hypertension BP running high at home, ok here. Eating only frozen dinners and canned meats/foods. 2 gm sodium diet. Add amlodipine 5 mg daily.  COPD with ongoing tobacco use-smoking 8 cigarettes daily-advised to quit.  Left lung squamous cell carcinoma treated with radiation  Shared Decision Making/Informed Consent        Medication Adjustments/Labs and Tests Ordered: Current medicines are reviewed at length with the patient today.  Concerns regarding medicines are outlined above.  Medication changes, Labs and Tests ordered today are listed in the Patient Instructions below. Patient Instructions   Medication Instructions:  Your physician has recommended you make the following change in your medication:   Start Amlodipine ( Norvasc) 5 mg Daily   Your physician has requested that you regularly monitor and record your blood pressure readings at home. Please use the same machine at the same time of day to check your readings and record them to bring to your follow-up visit.   *If you need a refill on your cardiac medications before your next appointment, please call your pharmacy*   Lab Work: NONE   If you have labs (blood work) drawn today and your tests are completely normal, you will receive your results only by: Marland Kitchen MyChart Message (if you have MyChart) OR . A paper copy in  the mail If you have any lab test that is abnormal or we need to change your treatment, we will call you to review the results.   Testing/Procedures: Your physician has requested that you have an echocardiogram. Echocardiography is a painless test that uses sound waves to create images of your heart. It provides your doctor with information about the size and shape of your heart and how well your heart's chambers and valves are working. This procedure takes approximately one hour. There are no restrictions for this procedure.  ZIO XT- Long Term Monitor Instructions   Your physician has requested you wear your ZIO patch monitor___14____days.   This is a single patch monitor.  [employer] supplies one patch monitor per enrollment.  Additional stickers are not available.   Please do not apply patch if you will be having a Nuclear Stress Test, Echocardiogram, Cardiac CT, MRI, or Chest Xray during the time frame you would be wearing the monitor. The patch cannot be worn during these tests.  You cannot remove and re-apply the ZIO XT patch monitor.   Your ZIO patch monitor will be sent USPS Priority mail from [facility] directly to your home address. The monitor may also be mailed to a PO BOX if home delivery is not available.   It may take 3-5 days to receive your monitor after you have been enrolled.   Once you have received you monitor, please review enclosed instructions.  Your monitor has already been registered assigning a specific monitor serial # to you.   Applying the monitor   Shave hair from upper left chest.   Hold abrader disc by  orange tab.  Rub abrader in 40 strokes over left upper chest as indicated in your monitor instructions.   Clean area with 4 enclosed alcohol pads .  Use all pads to assure are is cleaned thoroughly.  Let dry.   Apply patch as indicated in monitor instructions.  Patch will be place under collarbone on left side of chest with arrow pointing upward.    Rub patch adhesive wings for 2 minutes.Remove white label marked "1".  Remove white label marked "2".  Rub patch adhesive wings for 2 additional minutes.   While looking in a mirror, press and release button in center of patch.  A small green light will flash 3-4 times .  This will be your only indicator the monitor has been turned on.     Do not shower for the first 24 hours.  You may shower after the first 24 hours.   Press button if you feel a symptom. You will hear a small click.  Record Date, Time and Symptom in the Patient Log Book.   When you are ready to remove patch, follow instructions on last 2 pages of Patient Log Book.  Stick patch monitor onto last page of Patient Log Book.   Place Patient Log Book in [place] box.  Use locking tab on box and tape box closed securely.  The [employer] has [employer] on it.  Please place in mailbox as soon as possible.  Your physician should have your test results approximately 7 days after the monitor has been mailed back to [facility].   Call [facility] at [phone] if you have questions regarding your ZIO XT patch monitor.  Call them immediately if you see an orange light blinking on your monitor.   If your monitor falls off in less than 4 days contact our Monitor department at [phone].  If your monitor becomes loose or falls off after 4 days call [employer] at [phone] for suggestions on securing your monitor.   Follow-Up: At [facility], you and your health needs are our priority.  As part of our continuing mission to provide you with exceptional heart care, we have created designated Provider Care Teams.  These Care Teams include your primary Cardiologist (physician) and Advanced Practice Providers (APPs -  Physician Assistants and Nurse Practitioners) who all work together to provide you with the care you need, when you need it.  We recommend signing up for the patient portal called "MyChart".   Sign up information is provided on this After Visit Summary.  MyChart is used to connect with patients for Virtual Visits (Telemedicine).  Patients are able to view lab/test results, encounter notes, upcoming appointments, etc.  Non-urgent messages can be sent to your provider as well.   To learn more about what you can do with MyChart, go to [email or web address].    Your next appointment:    As planned   The format for your next appointment:   In Person  Provider:   Rozann Lesches, MD   Other Instructions  Thank you for choosing [facility]!  Two Gram Sodium Diet 2000 mg  What is Sodium? Sodium is a mineral found naturally in many foods. The most significant source of sodium in the diet is table salt, which is about 40% sodium.  Processed, convenience, and preserved foods also contain a large amount of sodium.  The body needs only 500 mg of sodium daily to function,  A normal diet provides more than  enough sodium even if you do not use salt.  Why Limit Sodium? A build up of sodium in the body can cause thirst, increased blood pressure, shortness of breath, and water retention.  Decreasing sodium in the diet can reduce edema and risk of heart attack or stroke associated with high blood pressure.  Keep in mind that there are many other factors involved in these health problems.  Heredity, obesity, lack of exercise, cigarette smoking, stress and what you eat all play a role.  General Guidelines:  Do not add salt at the table or in cooking.  One teaspoon of salt contains over 2 grams of sodium.  Read food labels  Avoid processed and convenience foods  Ask your dietitian before eating any foods not dicussed in the menu planning guidelines  Consult your physician if you wish to use a salt substitute or a sodium containing medication such as antacids.  Limit milk and milk products to 16 oz (2 cups) per day.  Shopping Hints:  READ LABELS!! "Dietetic" does not necessarily  mean low sodium.  Salt and other sodium ingredients are often added to foods during processing.   Menu Planning Guidelines Food Group Choose More Often Avoid  Beverages (see also the milk group All fruit juices, low-sodium, salt-free vegetables juices, low-sodium carbonated beverages Regular vegetable or tomato juices, commercially softened water used for drinking or cooking  Breads and Cereals Enriched white, wheat, rye and pumpernickel bread, hard rolls and dinner rolls; muffins, cornbread and waffles; most dry cereals, cooked cereal without added salt; unsalted crackers and breadsticks; low sodium or homemade bread crumbs Bread, rolls and crackers with salted tops; quick breads; instant hot cereals; pancakes; commercial bread stuffing; self-rising flower and biscuit mixes; regular bread crumbs or cracker crumbs  Desserts and Sweets Desserts and sweets mad with mild should be within allowance Instant pudding mixes and cake mixes  Fats Butter or margarine; vegetable oils; unsalted salad dressings, regular salad dressings limited to 1 Tbs; light, sour and heavy cream Regular salad dressings containing bacon fat, bacon bits, and salt pork; snack dips made with instant soup mixes or processed cheese; salted nuts  Fruits Most fresh, frozen and canned fruits Fruits processed with salt or sodium-containing ingredient (some dried fruits are processed with sodium sulfites        Vegetables Fresh, frozen vegetables and low- sodium canned vegetables Regular canned vegetables, sauerkraut, pickled vegetables, and others prepared in brine; frozen vegetables in sauces; vegetables seasoned with ham, bacon or salt pork  Condiments, Sauces, Miscellaneous  Salt substitute with physician's approval; pepper, herbs, spices; vinegar, lemon or lime juice; hot pepper sauce; garlic powder, onion powder, low sodium soy sauce (1 Tbs.); low sodium condiments (ketchup, chili sauce, mustard) in limited amounts (1 tsp.) fresh  ground horseradish; unsalted tortilla chips, pretzels, potato chips, popcorn, salsa (1/4 cup) Any seasoning made with salt including garlic salt, celery salt, onion salt, and seasoned salt; sea salt, rock salt, kosher salt; meat tenderizers; monosodium glutamate; mustard, regular soy sauce, barbecue, sauce, chili sauce, teriyaki sauce, steak sauce, Worcestershire sauce, and most flavored vinegars; canned gravy and mixes; regular condiments; salted snack foods, olives, picles, relish, horseradish sauce, catsup   Food preparation: Try these seasonings Meats:    Pork Sage, onion Serve with applesauce  Chicken Poultry seasoning, thyme, parsley Serve with cranberry sauce  Lamb Curry powder, rosemary, garlic, thyme Serve with mint sauce or jelly  Veal Marjoram, basil Serve with current jelly, cranberry sauce  Beef Pepper, bay leaf Serve  with dry mustard, unsalted chive butter  Fish [place] leaf, dill Serve with unsalted lemon butter, unsalted parsley butter  Vegetables:    Asparagus Lemon juice   Broccoli Lemon juice   Carrots Mustard dressing parsley, mint, nutmeg, glazed with unsalted butter and sugar   Green beans Marjoram, lemon juice, nutmeg,dill seed   Tomatoes Basil, marjoram, onion   Spice /blend for [employer]" 4 tsp ground thyme 1 tsp ground sage 3 tsp ground rosemary 4 tsp ground marjoram   Test your knowledge 1. A product that says "Salt Free" may still contain sodium. True or False 2. Garlic Powder and Hot Pepper Sauce an be used as alternative seasonings.True or False 3. Processed foods have more sodium than fresh foods.  True or False 4. Canned Vegetables have less sodium than froze True or False  WAYS TO DECREASE YOUR SODIUM INTAKE 1. Avoid the use of added salt in cooking and at the table.  Table salt (and other prepared seasonings which contain salt) is probably one of the greatest sources of sodium in the diet.  Unsalted foods can gain flavor from the sweet, sour, and butter  taste sensations of herbs and spices.  Instead of using salt for seasoning, try the following seasonings with the foods listed.  Remember: how you use them to enhance natural food flavors is limited only by your creativity... Allspice-Meat, fish, eggs, fruit, peas, red and yellow vegetables Almond Extract-Fruit baked goods Anise Seed-Sweet breads, fruit, carrots, beets, cottage cheese, cookies (tastes like licorice) Basil-Meat, fish, eggs, vegetables, rice, vegetables salads, soups, sauces Bay Leaf-Meat, fish, stews, poultry Burnet-Salad, vegetables (cucumber-like flavor) Caraway Seed-Bread, cookies, cottage cheese, meat, vegetables, cheese, rice Cardamon-Baked goods, fruit, soups Celery Powder or seed-Salads, salad dressings, sauces, meatloaf, soup, bread.Do not use  celery salt Chervil-Meats, salads, fish, eggs, vegetables, cottage cheese (parsley-like flavor) Chili Power-Meatloaf, chicken cheese, corn, eggplant, egg dishes Chives-Salads cottage cheese, egg dishes, soups, vegetables, sauces Cilantro-Salsa, casseroles Cinnamon-Baked goods, fruit, pork, lamb, chicken, carrots Cloves-Fruit, baked goods, fish, pot roast, green beans, beets, carrots Coriander-Pastry, cookies, meat, salads, cheese (lemon-orange flavor) Cumin-Meatloaf, fish,cheese, eggs, cabbage,fruit pie (caraway flavor) [employer], fruit, eggs, fish, poultry, cottage cheese, vegetables Dill Seed-Meat, cottage cheese, poultry, vegetables, fish, salads, bread Fennel Seed-Bread, cookies, apples, pork, eggs, fish, beets, cabbage, cheese, Licorice-like flavor Garlic-(buds or powder) Salads, meat, poultry, fish, bread, butter, vegetables, potatoes.Do not  use garlic salt Ginger-Fruit, vegetables, baked goods, meat, fish, poultry Horseradish Root-Meet, vegetables, butter Lemon Juice or Extract-Vegetables, fruit, tea, baked goods, fish salads Mace-Baked goods fruit, vegetables, fish, poultry (taste like nutmeg) Maple  Extract-Syrups Marjoram-Meat, chicken, fish, vegetables, breads, green salads (taste like Sage) Mint-Tea, lamb, sherbet, vegetables, desserts, carrots, cabbage Mustard, Dry or Seed-Cheese, eggs, meats, vegetables, poultry Nutmeg-Baked goods, fruit, chicken, eggs, vegetables, desserts Onion Powder-Meat, fish, poultry, vegetables, cheese, eggs, bread, rice salads (Do not use   Onion salt) Orange Extract-Desserts, baked goods Oregano-Pasta, eggs, cheese, onions, pork, lamb, fish, chicken, vegetables, green salads Paprika-Meat, fish, poultry, eggs, cheese, vegetables Parsley [name]-Butter, vegetables, meat fish, poultry, eggs, bread, salads (certain forms may   Contain sodium Pepper-Meat fish, poultry, vegetables, eggs Peppermint Extract-Desserts, baked goods Poppy Seed-Eggs, bread, cheese, fruit dressings, baked goods, noodles, vegetables, cottage  [employer], poultry, meat, fish, cauliflower, turnips,eggs bread Saffron-Rice, bread, veal, chicken, fish, eggs Sage-Meat, fish, poultry, onions, eggplant, tomateos, pork, stews Savory-Eggs, salads, poultry, meat, rice, vegetables, soups, pork Tarragon-Meat, poultry, fish, eggs, butter, vegetables (licorice-like flavor)  Thyme-Meat, poultry, fish, eggs, vegetables, (clover-like flavor), sauces, soups  Tumeric-Salads, butter, eggs, fish, rice, vegetables (saffron-like flavor) Vanilla Extract-Baked goods, candy Vinegar-Salads, vegetables, meat marinades Walnut Extract-baked goods, candy  2. Choose your Foods Wisely   The following is a list of foods to avoid which are high in sodium:  Meats-Avoid all smoked, canned, salt cured, dried and kosher meat and fish as well as Anchovies   [employer] Rx meats:Bologna, Liverwurst, Pastrami Canned meat or fish  Marinated herring Caviar    Pepperoni Corned Beef   Pizza Dried chipped beef  Salami Frozen breaded fish or meat Salt pork Frankfurters or hot  dogs  Sardines Gefilte fish   Sausage Ham (boiled ham, Proscuitto Smoked butt    spiced ham)   Spam      TV Dinners Vegetables Canned vegetables (Regular) Relish Canned mushrooms  Sauerkraut Olives    Tomato juice Pickles  Bakery and Dessert Products Canned puddings  Cream pies Cheesecake   Decorated cakes Cookies  Beverages/Juices Tomato juice, regular  Gatorade   V-8 vegetable juice, regular  Breads and Cereals Biscuit mixes   Salted potato chips, corn chips, pretzels Bread stuffing mixes  Salted crackers and rolls Pancake and waffle mixes Self-rising flour  Seasonings Accent    Meat sauces Barbecue sauce  Meat tenderizer Catsup    Monosodium glutamate (MSG) Celery salt   Onion salt Chili sauce   Prepared mustard Garlic salt   Salt, seasoned salt, sea salt Gravy mixes   Soy sauce Horseradish   Steak sauce Ketchup   Tartar sauce Lite salt    Teriyaki sauce Marinade mixes   Worcestershire sauce  Others Baking powder   Cocoa and cocoa mixes Baking soda   Commercial casserole mixes Candy-caramels, chocolate  Dehydrated soups    Bars, fudge,nougats  Instant rice and pasta mixes Canned broth or soup  Maraschino cherries Cheese, aged and processed cheese and cheese spreads  Learning Assessment Quiz  Indicated T (for True) or F (for False) for each of the following statements:  1. _____ Fresh fruits and vegetables and unprocessed grains are generally low in sodium 2. _____ Water may contain a considerable amount of sodium, depending on the source 3. _____ You can always tell if a food is high in sodium by tasting it 4. _____ Certain laxatives my be high in sodium and should be avoided unless prescribed   by a physician or pharmacist 5. _____ Salt substitutes may be used freely by anyone on a sodium restricted diet 6. _____ Sodium is present in table salt, food additives and as a natural component of   most foods 7. _____ Table salt is approximately 90%  sodium 8. _____ Limiting sodium intake may help prevent excess fluid accumulation in the body 9. _____ On a sodium-restricted diet, seasonings such as bouillon soy sauce, and    cooking wine should be used in place of table salt 10. _____ On an ingredient list, a product which lists monosodium glutamate as the first   ingredient is an appropriate food to include on a low sodium diet  Circle the best answer(s) to the following statements (Hint: there may be more than one correct answer)  11. On a low-sodium diet, some acceptable snack items are:    A. Olives  F. Bean dip   K. Grapefruit juice    B. Salted Pretzels G. Commercial Popcorn   L. Canned peaches    C. Carrot Sticks  H. Bouillon   M. Unsalted nuts   D. French fries  I.  Peanut butter crackers N. Salami   E. Sweet pickles J. Tomato Juice   O. Pizza  12.  Seasonings that may be used freely on a reduced - sodium diet include   A. Lemon wedges F.Monosodium glutamate K. Celery seed    B.Soysauce   G. Pepper   L. Mustard powder   C. Sea salt  H. Cooking wine  M. Onion flakes   D. Vinegar  E. Prepared horseradish N. Salsa   E. Sage   J. Worcestershire sauce  O. [address]      Sumner Boast, PA-C  09/01/2020 12:19 PM    [facility] [place], [place], [place]  [address] Phone: [phone]; Fax: [phone]

## 2020-09-01 NOTE — Patient Instructions (Signed)
Medication Instructions:  Your physician has recommended you make the following change in your medication:   Start Amlodipine ( Norvasc) 5 mg Daily   Your physician has requested that you regularly monitor and record your blood pressure readings at home. Please use the same machine at the same time of day to check your readings and record them to bring to your follow-up visit.   *If you need a refill on your cardiac medications before your next appointment, please call your pharmacy*   Lab Work: NONE   If you have labs (blood work) drawn today and your tests are completely normal, you will receive your results only by: [facility] MyChart Message (if you have MyChart) OR . A paper copy in the mail If you have any lab test that is abnormal or we need to change your treatment, we will call you to review the results.   Testing/Procedures: Your physician has requested that you have an echocardiogram. Echocardiography is a painless test that uses sound waves to create images of your heart. It provides your doctor with information about the size and shape of your heart and how well your heart's chambers and valves are working. This procedure takes approximately one hour. There are no restrictions for this procedure.  ZIO XT- Long Term Monitor Instructions   Your physician has requested you wear your ZIO patch monitor___14____days.   This is a single patch monitor.  [employer] supplies one patch monitor per enrollment.  Additional stickers are not available.   Please do not apply patch if you will be having a Nuclear Stress Test, Echocardiogram, Cardiac CT, MRI, or Chest Xray during the time frame you would be wearing the monitor. The patch cannot be worn during these tests.  You cannot remove and re-apply the ZIO XT patch monitor.   Your ZIO patch monitor will be sent USPS Priority mail from [facility] directly to your home address. The monitor may also be mailed to a PO BOX if home delivery is  not available.   It may take 3-5 days to receive your monitor after you have been enrolled.   Once you have received you monitor, please review enclosed instructions.  Your monitor has already been registered assigning a specific monitor serial # to you.   Applying the monitor   Shave hair from upper left chest.   Hold abrader disc by orange tab.  Rub abrader in 40 strokes over left upper chest as indicated in your monitor instructions.   Clean area with 4 enclosed alcohol pads .  Use all pads to assure are is cleaned thoroughly.  Let dry.   Apply patch as indicated in monitor instructions.  Patch will be place under collarbone on left side of chest with arrow pointing upward.   Rub patch adhesive wings for 2 minutes.Remove white label marked "1".  Remove white label marked "2".  Rub patch adhesive wings for 2 additional minutes.   While looking in a mirror, press and release button in center of patch.  A small green light will flash 3-4 times .  This will be your only indicator the monitor has been turned on.     Do not shower for the first 24 hours.  You may shower after the first 24 hours.   Press button if you feel a symptom. You will hear a small click.  Record Date, Time and Symptom in the Patient Log Book.   When you are ready to remove patch, follow instructions on last  2 pages of Patient Log Book.  Stick patch monitor onto last page of Patient Log Book.   Place Patient Log Book in [place] box.  Use locking tab on box and tape box closed securely.  The [employer] has [employer] on it.  Please place in mailbox as soon as possible.  Your physician should have your test results approximately 7 days after the monitor has been mailed back to [facility].   Call [place] at [phone] if you have questions regarding your ZIO XT patch monitor.  Call them immediately if you see an orange light blinking on your monitor.   If your monitor falls off in  less than 4 days contact our Monitor department at [phone].  If your monitor becomes loose or falls off after 4 days call [employer] at [phone] for suggestions on securing your monitor.   Follow-Up: At [facility], you and your health needs are our priority.  As part of our continuing mission to provide you with exceptional heart care, we have created designated Provider Care Teams.  These Care Teams include your primary Cardiologist (physician) and Advanced Practice Providers (APPs -  Physician Assistants and Nurse Practitioners) who all work together to provide you with the care you need, when you need it.  We recommend signing up for the patient portal called "MyChart".  Sign up information is provided on this After Visit Summary.  MyChart is used to connect with patients for Virtual Visits (Telemedicine).  Patients are able to view lab/test results, encounter notes, upcoming appointments, etc.  Non-urgent messages can be sent to your provider as well.   To learn more about what you can do with MyChart, go to [email or web address].    Your next appointment:    As planned   The format for your next appointment:   In Person  Provider:   Rozann Lesches, MD   Other Instructions  Thank you for choosing [facility]!  Two Gram Sodium Diet 2000 mg  What is Sodium? Sodium is a mineral found naturally in many foods. The most significant source of sodium in the diet is table salt, which is about 40% sodium.  Processed, convenience, and preserved foods also contain a large amount of sodium.  The body needs only 500 mg of sodium daily to function,  A normal diet provides more than enough sodium even if you do not use salt.  Why Limit Sodium? A build up of sodium in the body can cause thirst, increased blood pressure, shortness of breath, and water retention.  Decreasing sodium in the diet can reduce edema and risk of heart attack or stroke associated with high blood pressure.   Keep in mind that there are many other factors involved in these health problems.  Heredity, obesity, lack of exercise, cigarette smoking, stress and what you eat all play a role.  General Guidelines:  Do not add salt at the table or in cooking.  One teaspoon of salt contains over 2 grams of sodium.  Read food labels  Avoid processed and convenience foods  Ask your dietitian before eating any foods not dicussed in the menu planning guidelines  Consult your physician if you wish to use a salt substitute or a sodium containing medication such as antacids.  Limit milk and milk products to 16 oz (2 cups) per day.  Shopping Hints:  READ LABELS!! "Dietetic" does not necessarily mean low sodium.  Salt and other sodium ingredients are often added to  foods during processing.   Menu Planning Guidelines Food Group Choose More Often Avoid  Beverages (see also the milk group All fruit juices, low-sodium, salt-free vegetables juices, low-sodium carbonated beverages Regular vegetable or tomato juices, commercially softened water used for drinking or cooking  Breads and Cereals Enriched white, wheat, rye and pumpernickel bread, hard rolls and dinner rolls; muffins, cornbread and waffles; most dry cereals, cooked cereal without added salt; unsalted crackers and breadsticks; low sodium or homemade bread crumbs Bread, rolls and crackers with salted tops; quick breads; instant hot cereals; pancakes; commercial bread stuffing; self-rising flower and biscuit mixes; regular bread crumbs or cracker crumbs  Desserts and Sweets Desserts and sweets mad with mild should be within allowance Instant pudding mixes and cake mixes  Fats Butter or margarine; vegetable oils; unsalted salad dressings, regular salad dressings limited to 1 Tbs; light, sour and heavy cream Regular salad dressings containing bacon fat, bacon bits, and salt pork; snack dips made with instant soup mixes or processed cheese; salted nuts  Fruits  Most fresh, frozen and canned fruits Fruits processed with salt or sodium-containing ingredient (some dried fruits are processed with sodium sulfites        Vegetables Fresh, frozen vegetables and low- sodium canned vegetables Regular canned vegetables, sauerkraut, pickled vegetables, and others prepared in brine; frozen vegetables in sauces; vegetables seasoned with ham, bacon or salt pork  Condiments, Sauces, Miscellaneous  Salt substitute with physician's approval; pepper, herbs, spices; vinegar, lemon or lime juice; hot pepper sauce; garlic powder, onion powder, low sodium soy sauce (1 Tbs.); low sodium condiments (ketchup, chili sauce, mustard) in limited amounts (1 tsp.) fresh ground horseradish; unsalted tortilla chips, pretzels, potato chips, popcorn, salsa (1/4 cup) Any seasoning made with salt including garlic salt, celery salt, onion salt, and seasoned salt; sea salt, rock salt, kosher salt; meat tenderizers; monosodium glutamate; mustard, regular soy sauce, barbecue, sauce, chili sauce, teriyaki sauce, steak sauce, Worcestershire sauce, and most flavored vinegars; canned gravy and mixes; regular condiments; salted snack foods, olives, picles, relish, horseradish sauce, catsup   Food preparation: Try these seasonings Meats:    Pork Sage, onion Serve with applesauce  Chicken Poultry seasoning, thyme, parsley Serve with cranberry sauce  Lamb Curry powder, rosemary, garlic, thyme Serve with mint sauce or jelly  Veal Marjoram, basil Serve with current jelly, cranberry sauce  Beef Pepper, bay leaf Serve with dry mustard, unsalted chive butter  Fish Bay leaf, dill Serve with unsalted lemon butter, unsalted parsley butter  Vegetables:    Asparagus Lemon juice   Broccoli Lemon juice   Carrots Mustard dressing parsley, mint, nutmeg, glazed with unsalted butter and sugar   Green beans Marjoram, lemon juice, nutmeg,dill seed   Tomatoes Basil, marjoram, onion   Spice /blend for [employer]"  4 tsp ground thyme 1 tsp ground sage 3 tsp ground rosemary 4 tsp ground marjoram   Test your knowledge 1. A product that says "Salt Free" may still contain sodium. True or False 2. Garlic Powder and Hot Pepper Sauce an be used as alternative seasonings.True or False 3. Processed foods have more sodium than fresh foods.  True or False 4. Canned Vegetables have less sodium than froze True or False  WAYS TO DECREASE YOUR SODIUM INTAKE 1. Avoid the use of added salt in cooking and at the table.  Table salt (and other prepared seasonings which contain salt) is probably one of the greatest sources of sodium in the diet.  Unsalted foods can gain flavor from the  sweet, sour, and butter taste sensations of herbs and spices.  Instead of using salt for seasoning, try the following seasonings with the foods listed.  Remember: how you use them to enhance natural food flavors is limited only by your creativity... Allspice-Meat, fish, eggs, fruit, peas, red and yellow vegetables Almond Extract-Fruit baked goods Anise Seed-Sweet breads, fruit, carrots, beets, cottage cheese, cookies (tastes like licorice) Basil-Meat, fish, eggs, vegetables, rice, vegetables salads, soups, sauces Bay Leaf-Meat, fish, stews, poultry Burnet-Salad, vegetables (cucumber-like flavor) Caraway Seed-Bread, cookies, cottage cheese, meat, vegetables, cheese, rice Cardamon-Baked goods, fruit, soups Celery Powder or seed-Salads, salad dressings, sauces, meatloaf, soup, bread.Do not use  celery salt Chervil-Meats, salads, fish, eggs, vegetables, cottage cheese (parsley-like flavor) Chili Power-Meatloaf, chicken cheese, corn, eggplant, egg dishes Chives-Salads cottage cheese, egg dishes, soups, vegetables, sauces Cilantro-Salsa, casseroles Cinnamon-Baked goods, fruit, pork, lamb, chicken, carrots Cloves-Fruit, baked goods, fish, pot roast, green beans, beets, carrots Coriander-Pastry, cookies, meat, salads, cheese (lemon-orange  flavor) Cumin-Meatloaf, fish,cheese, eggs, cabbage,fruit pie (caraway flavor) [employer], fruit, eggs, fish, poultry, cottage cheese, vegetables Dill Seed-Meat, cottage cheese, poultry, vegetables, fish, salads, bread Fennel Seed-Bread, cookies, apples, pork, eggs, fish, beets, cabbage, cheese, Licorice-like flavor Garlic-(buds or powder) Salads, meat, poultry, fish, bread, butter, vegetables, potatoes.Do not  use garlic salt Ginger-Fruit, vegetables, baked goods, meat, fish, poultry Horseradish Root-Meet, vegetables, butter Lemon Juice or Extract-Vegetables, fruit, tea, baked goods, fish salads Mace-Baked goods fruit, vegetables, fish, poultry (taste like nutmeg) Maple Extract-Syrups Marjoram-Meat, chicken, fish, vegetables, breads, green salads (taste like Sage) Mint-Tea, lamb, sherbet, vegetables, desserts, carrots, cabbage Mustard, Dry or Seed-Cheese, eggs, meats, vegetables, poultry Nutmeg-Baked goods, fruit, chicken, eggs, vegetables, desserts Onion Powder-Meat, fish, poultry, vegetables, cheese, eggs, bread, rice salads (Do not use   Onion salt) Orange Extract-Desserts, baked goods Oregano-Pasta, eggs, cheese, onions, pork, lamb, fish, chicken, vegetables, green salads Paprika-Meat, fish, poultry, eggs, cheese, vegetables Parsley [name]-Butter, vegetables, meat fish, poultry, eggs, bread, salads (certain forms may   Contain sodium Pepper-Meat fish, poultry, vegetables, eggs Peppermint Extract-Desserts, baked goods Poppy Seed-Eggs, bread, cheese, fruit dressings, baked goods, noodles, vegetables, cottage  [employer], poultry, meat, fish, cauliflower, turnips,eggs bread Saffron-Rice, bread, veal, chicken, fish, eggs Sage-Meat, fish, poultry, onions, eggplant, tomateos, pork, stews Savory-Eggs, salads, poultry, meat, rice, vegetables, soups, pork Tarragon-Meat, poultry, fish, eggs, butter, vegetables (licorice-like  flavor)  Thyme-Meat, poultry, fish, eggs, vegetables, (clover-like flavor), sauces, soups Tumeric-Salads, butter, eggs, fish, rice, vegetables (saffron-like flavor) Vanilla Extract-Baked goods, candy Vinegar-Salads, vegetables, meat marinades Walnut Extract-baked goods, candy  2. Choose your Foods Wisely   The following is a list of foods to avoid which are high in sodium:  Meats-Avoid all smoked, canned, salt cured, dried and kosher meat and fish as well as Anchovies   [employer] Rx meats:Bologna, Liverwurst, Pastrami Canned meat or fish  Marinated herring Caviar    Pepperoni Corned Beef   Pizza Dried chipped beef  Salami Frozen breaded fish or meat Salt pork Frankfurters or hot dogs  Sardines Gefilte fish   Sausage Ham (boiled ham, Proscuitto Smoked butt    spiced ham)   Spam      TV Dinners Vegetables Canned vegetables (Regular) Relish Canned mushrooms  Sauerkraut Olives    Tomato juice Pickles  Bakery and Dessert Products Canned puddings  Cream pies Cheesecake   Decorated cakes Cookies  Beverages/Juices Tomato juice, regular  Gatorade   V-8 vegetable juice, regular  Breads and Cereals Biscuit mixes   Salted potato chips, corn chips, pretzels Bread stuffing  mixes  Salted crackers and rolls Pancake and waffle mixes Self-rising flour  Seasonings Accent    Meat sauces Barbecue sauce  Meat tenderizer Catsup    Monosodium glutamate (MSG) Celery salt   Onion salt Chili sauce   Prepared mustard Garlic salt   Salt, seasoned salt, sea salt Gravy mixes   Soy sauce Horseradish   Steak sauce Ketchup   Tartar sauce Lite salt    Teriyaki sauce Marinade mixes   Worcestershire sauce  Others Baking powder   Cocoa and cocoa mixes Baking soda   Commercial casserole mixes Candy-caramels, chocolate  Dehydrated soups    Bars, fudge,nougats  Instant rice and pasta mixes Canned broth or soup  Maraschino cherries Cheese, aged and processed cheese and cheese  spreads  Learning Assessment Quiz  Indicated T (for True) or F (for False) for each of the following statements:  1. _____ Fresh fruits and vegetables and unprocessed grains are generally low in sodium 2. _____ Water may contain a considerable amount of sodium, depending on the source 3. _____ You can always tell if a food is high in sodium by tasting it 4. _____ Certain laxatives my be high in sodium and should be avoided unless prescribed   by a physician or pharmacist 5. _____ Salt substitutes may be used freely by anyone on a sodium restricted diet 6. _____ Sodium is present in table salt, food additives and as a natural component of   most foods 7. _____ Table salt is approximately 90% sodium 8. _____ Limiting sodium intake may help prevent excess fluid accumulation in the body 9. _____ On a sodium-restricted diet, seasonings such as bouillon soy sauce, and    cooking wine should be used in place of table salt 10. _____ On an ingredient list, a product which lists monosodium glutamate as the first   ingredient is an appropriate food to include on a low sodium diet  Circle the best answer(s) to the following statements (Hint: there may be more than one correct answer)  11. On a low-sodium diet, some acceptable snack items are:    A. Olives  F. Bean dip   K. Grapefruit juice    B. Salted Pretzels G. Commercial Popcorn   L. Canned peaches    C. Carrot Sticks  H. Bouillon   M. Unsalted nuts   D. [place] fries  I. Peanut butter crackers N. Salami   E. Sweet pickles J. Tomato Juice   O. Pizza  12.  Seasonings that may be used freely on a reduced - sodium diet include   A. Lemon wedges F.Monosodium glutamate K. Celery seed    B.Soysauce   G. Pepper   L. Mustard powder   C. Sea salt  H. Cooking wine  M. Onion flakes   D. Vinegar  E. Prepared horseradish N. Salsa   E. Sage   J. Worcestershire sauce  O. [place]

## 2020-09-04 DIAGNOSIS — J449 Chronic obstructive pulmonary disease, unspecified: Secondary | ICD-10-CM | POA: Diagnosis not present

## 2020-09-04 DIAGNOSIS — I2510 Atherosclerotic heart disease of native coronary artery without angina pectoris: Secondary | ICD-10-CM | POA: Diagnosis not present

## 2020-09-18 DIAGNOSIS — M4726 Other spondylosis with radiculopathy, lumbar region: Secondary | ICD-10-CM | POA: Diagnosis not present

## 2020-09-18 DIAGNOSIS — M48061 Spinal stenosis, lumbar region without neurogenic claudication: Secondary | ICD-10-CM | POA: Diagnosis not present

## 2020-09-18 DIAGNOSIS — M5136 Other intervertebral disc degeneration, lumbar region: Secondary | ICD-10-CM | POA: Diagnosis not present

## 2020-09-19 DIAGNOSIS — R42 Dizziness and giddiness: Secondary | ICD-10-CM | POA: Diagnosis not present

## 2020-09-23 ENCOUNTER — Encounter: Payer: Self-pay | Admitting: Cardiology

## 2020-09-23 ENCOUNTER — Other Ambulatory Visit: Payer: Self-pay

## 2020-09-23 ENCOUNTER — Ambulatory Visit (INDEPENDENT_AMBULATORY_CARE_PROVIDER_SITE_OTHER): Payer: Medicare HMO | Admitting: Cardiology

## 2020-09-23 VITALS — BP 140/86 | HR 63 | Ht 70.0 in | Wt 144.4 lb

## 2020-09-23 DIAGNOSIS — I471 Supraventricular tachycardia: Secondary | ICD-10-CM

## 2020-09-23 DIAGNOSIS — I25119 Atherosclerotic heart disease of native coronary artery with unspecified angina pectoris: Secondary | ICD-10-CM | POA: Diagnosis not present

## 2020-09-23 DIAGNOSIS — I255 Ischemic cardiomyopathy: Secondary | ICD-10-CM | POA: Diagnosis not present

## 2020-09-23 DIAGNOSIS — I493 Ventricular premature depolarization: Secondary | ICD-10-CM | POA: Diagnosis not present

## 2020-09-23 MED ORDER — METOPROLOL SUCCINATE ER 25 MG PO TB24: 25.0000 mg | ORAL_TABLET | Freq: Every day | ORAL | 3 refills | Status: DC

## 2020-09-23 NOTE — Progress Notes (Signed)
Cardiology Office Note  Date: 09/23/2020   ID: [name] Graham, DOB Sep 29, 1935, MRN [id number]  PCP:  Lysbeth Penner, FNP  Cardiologist:  Rozann Lesches, MD Electrophysiologist:  None   Chief Complaint  Patient presents with  . Cardiac follow-up    History of Present Illness: [name] Graham is an 85 y.o. male last seen in the office in early May by Ms. Vita Barley, I reviewed the note.  He presents for follow-up today, reports no recurring dizzy spells, no frank syncope or chest pain.  Denies any sense of palpitations.  He was started on Norvasc 5 mg daily at the last visit for better blood pressure control.  Remaining medicines are stable as outlined below.  Interval cardiac monitor results are outlined below.  He did have frequent PVCs representing approximately 5% total beats with brief episodes of NSVT but no sustained ventricular events.  Also several episodes of PSVT, the longest of which lasted for about 15 seconds.  No pauses.  We discussed increasing his Toprol-XL to 25 mg daily.  Echocardiogram has been ordered and is pending at this time.  Past Medical History:  Diagnosis Date  . Arthritis   . COPD (chronic obstructive pulmonary disease) ([place])   . Coronary atherosclerosis of native coronary artery    BMS circ and PTCA PDA 2007, DES RCA 10/09, DES LAD 2/12  . Dyspnea    PULMONARY NODULE   . Essential hypertension   . GERD (gastroesophageal reflux disease)   . Hearing loss    no hearing aids  . Heart attack ([place])    x 3 (latest 2009)  . History of kidney stones    passed stones  . Hyperlipidemia   . Ischemic cardiomyopathy   . NSTEMI (non-ST elevated myocardial infarction) ([place])    2007  . Pulmonary nodule 1 cm or greater in diameter 02/2020   Dr Lamonte Sakai    Past Surgical History:  Procedure Laterality Date  . ANGIOPLASTY     stent  . BRONCHIAL BIOPSY  02/19/2020   Procedure: BRONCHIAL BIOPSIES;  Surgeon: Collene Gobble, MD;  Location: [facility]  ENDOSCOPY;  Service: Pulmonary;;  . BRONCHIAL BRUSHINGS  02/19/2020   Procedure: BRONCHIAL BRUSHINGS;  Surgeon: Collene Gobble, MD;  Location: [facility] ENDOSCOPY;  Service: Pulmonary;;  . BRONCHIAL NEEDLE ASPIRATION BIOPSY  02/19/2020   Procedure: BRONCHIAL NEEDLE ASPIRATION BIOPSIES;  Surgeon: Collene Gobble, MD;  Location: [facility] ENDOSCOPY;  Service: Pulmonary;;  . COLONOSCOPY    . FIDUCIAL MARKER PLACEMENT  02/19/2020   Procedure: FIDUCIAL MARKER PLACEMENT;  Surgeon: Collene Gobble, MD;  Location: [facility] ENDOSCOPY;  Service: Pulmonary;;  . HERNIA REPAIR    . LIPOMA EXCISION Right    hip/buttox  . TONSILLECTOMY     as a child  . VIDEO BRONCHOSCOPY WITH ENDOBRONCHIAL NAVIGATION N/A 02/19/2020   Procedure: VIDEO BRONCHOSCOPY WITH ENDOBRONCHIAL NAVIGATION;  Surgeon: Collene Gobble, MD;  Location: [facility] ENDOSCOPY;  Service: Pulmonary;  Laterality: N/A;    Current Outpatient Medications  Medication Sig Dispense Refill  . albuterol (VENTOLIN HFA) 108 (90 Base) MCG/ACT inhaler Inhale into the lungs every 6 (six) hours as needed for wheezing or shortness of breath.    Marland Kitchen amLODipine (NORVASC) 5 MG tablet Take 1 tablet (5 mg total) by mouth daily. 90 tablet 3  . aspirin EC 81 MG tablet Take 1 tablet (81 mg total) by mouth daily. 90 tablet 3  . clopidogrel (PLAVIX) 75 MG tablet Take 1 tablet by mouth  once daily 90 tablet 0  . Ensure (ENSURE) Take 237 mLs by mouth 2 (two) times daily.    . fluticasone (FLONASE) 50 MCG/ACT nasal spray Place into both nostrils 3 (three) times daily as needed for allergies or rhinitis.    Marland Kitchen HYDROcodone-acetaminophen (NORCO) 7.5-325 MG tablet Take 1 tablet by mouth daily as needed.    . metoprolol succinate (TOPROL XL) 25 MG 24 hr tablet Take 1 tablet (25 mg total) by mouth daily. 90 tablet 3  . Naphazoline HCl (CLEAR EYES OP) Place 1 drop into both eyes daily as needed (irritation / redness).    . nitroGLYCERIN (NITROSTAT) 0.4 MG SL tablet DISSOLVE ONE TABLET UNDER THE TONGUE EVERY  5 MINUTES AS NEEDED FOR CHEST PAIN.  DO NOT EXCEED A TOTAL OF 3 DOSES IN 15 MINUTES 25 tablet 3  . olmesartan (BENICAR) 40 MG tablet Take 1 tablet (40 mg total) by mouth daily. 30 tablet 11  . OXYGEN Inhale into the lungs.    . simvastatin (ZOCOR) 80 MG tablet Take 1 tablet (80 mg total) by mouth at bedtime.    . Tiotropium Bromide Monohydrate (SPIRIVA RESPIMAT) 2.5 MCG/ACT AERS Inhale 2 puffs into the lungs daily. 4 g 11  . umeclidinium bromide (INCRUSE ELLIPTA) 62.5 MCG/INH AEPB Inhale 1 puff into the lungs daily. 30 each 11   No current facility-administered medications for this visit.   Allergies:  Entresto [sacubitril-valsartan]   ROS: No orthopnea or PND.  Physical Exam: VS:  BP 140/86   Pulse 63   Ht 5\' 10"  (1.778 m)   Wt 144 lb 6.4 oz (65.5 kg)   SpO2 98%   BMI 20.72 kg/m , BMI Body mass index is 20.72 kg/m.  Wt Readings from Last 3 Encounters:  09/23/20 144 lb 6.4 oz (65.5 kg)  09/01/20 146 lb 9.6 oz (66.5 kg)  07/16/20 143 lb 8 oz (65.1 kg)    General: Patient appears comfortable at rest. HEENT: Conjunctiva and lids normal, wearing a mask. Neck: Supple, no elevated JVP or carotid bruits, no thyromegaly. Lungs: Clear to auscultation, nonlabored breathing at rest. Cardiac: Regular rate and rhythm, no S3 or significant systolic murmur, no pericardial rub. Extremities: No pitting edema, distal pulses 2+.  ECG:  An ECG dated 09/01/2020 was personally reviewed today and demonstrated:  Sinus rhythm with PAC, right bundle branch block and left anterior fascicular block.  Recent Labwork: 07/09/2020: ALT 14; AST 19; BUN 20; Creatinine, Ser 1.04; Hemoglobin 15.5; Platelets 196; Potassium 4.2; Sodium 138     Component Value Date/Time   CHOL 138 05/09/2014 0734   CHOL 144 06/15/2013 0931   TRIG 92 05/09/2014 0734   HDL 55 05/09/2014 0734   HDL 61 06/15/2013 0931   CHOLHDL 2.5 05/09/2014 0734   VLDL 18 05/09/2014 0734   LDLCALC 65 05/09/2014 0734   LDLCALC 69 06/15/2013 0931     Other Studies Reviewed Today:  Cardiac monitor May 2022: ZIO XT reviewed.  14 days analyzed.  Predominant rhythm is sinus with heart rate ranging from 52 bpm up to 132 bpm and average heart rate 68 bpm.  There were rare PACs including couplets and triplets representing less than 1% total beats.  Relatively frequent PVCs were noted representing 5.2% total beats with rare couplets and triplets as well as ventricular bigeminy and trigeminy.  There were 5 brief episodes of NSVT, no longer than 5 beats observed.  No sustained ventricular events.  Several episodes of PSVT were noted, the longest of which  lasted approximately 15 seconds with average heart rate of 112.  There were no pauses.  Assessment and Plan:  1.  Relatively frequent PVCs and brief NSVT, PVC burden approximately 5% and importantly no sustained ventricular events.  Also has rare PACs and brief episodes of PSVT.  Plan to uptitrate Toprol-XL to 25 mg daily for now.  2.  Multivessel CAD status post prior PCI, most recently DES to the LAD in 2012.  He does not report any active angina at this time.  Continue aspirin, Plavix, Norvasc, Toprol-XL, Benicar, and Zocor.  3.  Ischemic cardiomyopathy, follow-up echocardiogram pending.  He remains on Toprol-XL and Benicar, no diuretic requirement at this point.  Previous intolerance to [employer].  LVEF previously 35 to 40% range, he declines ICD.  Medication Adjustments/Labs and Tests Ordered: Current medicines are reviewed at length with the patient today.  Concerns regarding medicines are outlined above.   Tests Ordered: No orders of the defined types were placed in this encounter.   Medication Changes: Meds ordered this encounter  Medications  . metoprolol succinate (TOPROL XL) 25 MG 24 hr tablet    Sig: Take 1 tablet (25 mg total) by mouth daily.    Dispense:  90 tablet    Refill:  3    09/23/20 dose increased to 25 mg qd    Disposition:  Follow up 6 months.  Signed, Satira Sark, MD, [facility] 09/23/2020 9:02 AM    [place] [facility] [address] [place], [place] [address] Phone: [phone]; Fax: [phone]

## 2020-09-23 NOTE — Patient Instructions (Signed)
Medication Instructions:  INCREASE Toprol to 25 mg daily  *If you need a refill on your cardiac medications before your next appointment, please call your pharmacy*   Lab Work: None today  If you have labs (blood work) drawn today and your tests are completely normal, you will receive your results only by: Marland Kitchen MyChart Message (if you have MyChart) OR . A paper copy in the mail If you have any lab test that is abnormal or we need to change your treatment, we will call you to review the results.   Testing/Procedures: None today    Follow-Up: At [facility], you and your health needs are our priority.  As part of our continuing mission to provide you with exceptional heart care, we have created designated Provider Care Teams.  These Care Teams include your primary Cardiologist (physician) and Advanced Practice Providers (APPs -  Physician Assistants and Nurse Practitioners) who all work together to provide you with the care you need, when you need it.  We recommend signing up for the patient portal called "MyChart".  Sign up information is provided on this After Visit Summary.  MyChart is used to connect with patients for Virtual Visits (Telemedicine).  Patients are able to view lab/test results, encounter notes, upcoming appointments, etc.  Non-urgent messages can be sent to your provider as well.   To learn more about what you can do with MyChart, go to [email or web address].    Your next appointment:   6 month(s)  The format for your next appointment:   In Person  Provider:   Rozann Lesches, MD   Other Instructions Keep echocardiogram appointment on 10/09/20

## 2020-10-05 DIAGNOSIS — J449 Chronic obstructive pulmonary disease, unspecified: Secondary | ICD-10-CM | POA: Diagnosis not present

## 2020-10-05 DIAGNOSIS — I2510 Atherosclerotic heart disease of native coronary artery without angina pectoris: Secondary | ICD-10-CM | POA: Diagnosis not present

## 2020-10-09 ENCOUNTER — Ambulatory Visit (HOSPITAL_COMMUNITY): Payer: Medicare HMO

## 2020-10-22 ENCOUNTER — Ambulatory Visit (HOSPITAL_COMMUNITY): Payer: Medicare HMO

## 2020-10-24 ENCOUNTER — Telehealth: Payer: Self-pay | Admitting: Cardiology

## 2020-10-24 MED ORDER — SIMVASTATIN 80 MG PO TABS
80.0000 mg | ORAL_TABLET | Freq: Every day | ORAL | 1 refills | Status: DC
Start: 2020-10-24 — End: 2021-05-13

## 2020-10-24 NOTE — Telephone Encounter (Signed)
*  STAT* If patient is at the pharmacy, call can be transferred to refill team.   1. Which medications need to be refilled? (please list name of each medication and dose if known) simvastatin (ZOCOR) 80 MG tablet  2. Which pharmacy/location (including street and city if local pharmacy) is medication to be sent to? [facility] in [place]  3. Do they need a 30 day or 90 day supply? [place]

## 2020-10-24 NOTE — Telephone Encounter (Signed)
Refilled per phone request.

## 2020-10-31 ENCOUNTER — Other Ambulatory Visit: Payer: Self-pay

## 2020-10-31 ENCOUNTER — Telehealth: Payer: Self-pay | Admitting: Cardiology

## 2020-10-31 ENCOUNTER — Ambulatory Visit (HOSPITAL_COMMUNITY)
Admission: RE | Admit: 2020-10-31 | Discharge: 2020-10-31 | Disposition: A | Discharge status: Home / Self Care | Payer: Medicare HMO | Source: Ambulatory Visit | Attending: Physician Assistant | Admitting: Physician Assistant

## 2020-10-31 DIAGNOSIS — I255 Ischemic cardiomyopathy: Secondary | ICD-10-CM

## 2020-10-31 LAB — ECHOCARDIOGRAM COMPLETE
Area-P 1/2: 2.94 cm2
S' Lateral: 3.6 cm

## 2020-10-31 MED ORDER — METOPROLOL SUCCINATE ER 25 MG PO TB24
25.0000 mg | ORAL_TABLET | Freq: Every day | ORAL | 3 refills | Status: DC
Start: 2020-10-31 — End: 2021-11-23

## 2020-10-31 NOTE — Progress Notes (Signed)
*  PRELIMINARY RESULTS* Echocardiogram 2D Echocardiogram has been performed.  [name] Graham 10/31/2020, 2:37 PM

## 2020-10-31 NOTE — Telephone Encounter (Signed)
Medication refill request approved for Toprol 25 mg tablets and sent to [facility] in [place]

## 2020-10-31 NOTE — Telephone Encounter (Signed)
New message     *STAT* If patient is at the pharmacy, call can be transferred to refill team.   1. Which medications need to be refilled? (please list name of each medication and dose if known) metoprolol succinate (TOPROL XL) 25 MG 24 hr tablet [[id number]]   2. Which pharmacy/location (including street and city if local pharmacy) is medication to be sent to [facility] in [place]  3. Do they need a 30 day or 90 day supply? [name] Graham

## 2020-11-04 DIAGNOSIS — I2510 Atherosclerotic heart disease of native coronary artery without angina pectoris: Secondary | ICD-10-CM | POA: Diagnosis not present

## 2020-11-04 DIAGNOSIS — J449 Chronic obstructive pulmonary disease, unspecified: Secondary | ICD-10-CM | POA: Diagnosis not present

## 2020-11-12 ENCOUNTER — Inpatient Hospital Stay (HOSPITAL_COMMUNITY): Payer: Medicare HMO | Attending: Hematology

## 2020-11-12 ENCOUNTER — Other Ambulatory Visit: Payer: Self-pay

## 2020-11-12 ENCOUNTER — Ambulatory Visit (HOSPITAL_COMMUNITY)
Admission: RE | Admit: 2020-11-12 | Discharge: 2020-11-12 | Disposition: A | Discharge status: Home / Self Care | Payer: Medicare HMO | Source: Ambulatory Visit | Attending: Hematology | Admitting: Hematology

## 2020-11-12 DIAGNOSIS — K219 Gastro-esophageal reflux disease without esophagitis: Secondary | ICD-10-CM | POA: Diagnosis not present

## 2020-11-12 DIAGNOSIS — E785 Hyperlipidemia, unspecified: Secondary | ICD-10-CM | POA: Diagnosis not present

## 2020-11-12 DIAGNOSIS — J439 Emphysema, unspecified: Secondary | ICD-10-CM | POA: Diagnosis not present

## 2020-11-12 DIAGNOSIS — I2510 Atherosclerotic heart disease of native coronary artery without angina pectoris: Secondary | ICD-10-CM | POA: Insufficient documentation

## 2020-11-12 DIAGNOSIS — Z79899 Other long term (current) drug therapy: Secondary | ICD-10-CM | POA: Diagnosis not present

## 2020-11-12 DIAGNOSIS — C3492 Malignant neoplasm of unspecified part of left bronchus or lung: Secondary | ICD-10-CM | POA: Insufficient documentation

## 2020-11-12 DIAGNOSIS — J449 Chronic obstructive pulmonary disease, unspecified: Secondary | ICD-10-CM | POA: Diagnosis not present

## 2020-11-12 DIAGNOSIS — M62831 Muscle spasm of calf: Secondary | ICD-10-CM | POA: Diagnosis not present

## 2020-11-12 DIAGNOSIS — I700 Atherosclerosis of aorta: Secondary | ICD-10-CM | POA: Diagnosis not present

## 2020-11-12 DIAGNOSIS — I252 Old myocardial infarction: Secondary | ICD-10-CM | POA: Insufficient documentation

## 2020-11-12 DIAGNOSIS — I119 Hypertensive heart disease without heart failure: Secondary | ICD-10-CM | POA: Diagnosis not present

## 2020-11-12 DIAGNOSIS — I255 Ischemic cardiomyopathy: Secondary | ICD-10-CM | POA: Diagnosis not present

## 2020-11-12 DIAGNOSIS — Z7982 Long term (current) use of aspirin: Secondary | ICD-10-CM | POA: Diagnosis not present

## 2020-11-12 LAB — CBC WITH DIFFERENTIAL/PLATELET
Abs Immature Granulocytes: 0.03 10*3/uL (ref 0.00–0.07)
Basophils Absolute: 0.1 10*3/uL (ref 0.0–0.1)
Basophils Relative: 1 %
Eosinophils Absolute: 0.1 10*3/uL (ref 0.0–0.5)
Eosinophils Relative: 2 %
HCT: 48.1 % (ref 39.0–52.0)
Hemoglobin: 16.4 g/dL (ref 13.0–17.0)
Immature Granulocytes: 0 %
Lymphocytes Relative: 21 %
Lymphs Abs: 1.6 10*3/uL (ref 0.7–4.0)
MCH: 33.3 pg (ref 26.0–34.0)
MCHC: 34.1 g/dL (ref 30.0–36.0)
MCV: 97.8 fL (ref 80.0–100.0)
Monocytes Absolute: 0.6 10*3/uL (ref 0.1–1.0)
Monocytes Relative: 9 %
Neutro Abs: 4.8 10*3/uL (ref 1.7–7.7)
Neutrophils Relative %: 67 %
Platelets: 188 10*3/uL (ref 150–400)
RBC: 4.92 MIL/uL (ref 4.22–5.81)
RDW: 13.4 % (ref 11.5–15.5)
WBC: 7.3 10*3/uL (ref 4.0–10.5)
nRBC: 0 % (ref 0.0–0.2)

## 2020-11-12 LAB — COMPREHENSIVE METABOLIC PANEL
ALT: 13 U/L (ref 0–44)
AST: 19 U/L (ref 15–41)
Albumin: 4.1 g/dL (ref 3.5–5.0)
Alkaline Phosphatase: 53 U/L (ref 38–126)
Anion gap: 8 (ref 5–15)
BUN: 15 mg/dL (ref 8–23)
CO2: 25 mmol/L (ref 22–32)
Calcium: 9.2 mg/dL (ref 8.9–10.3)
Chloride: 102 mmol/L (ref 98–111)
Creatinine, Ser: 1.08 mg/dL (ref 0.61–1.24)
GFR, Estimated: >60 mL/min (ref >60)
Glucose, Bld: 103 mg/dL — ABNORMAL HIGH (ref 70–99)
Potassium: 4.5 mmol/L (ref 3.5–5.1)
Sodium: 135 mmol/L (ref 135–145)
Total Bilirubin: 0.9 mg/dL (ref 0.3–1.2)
Total Protein: 7.5 g/dL (ref 6.5–8.1)

## 2020-11-12 MED ORDER — IOHEXOL 300 MG/ML  SOLN
75.0000 mL | Freq: Once | INTRAMUSCULAR | Status: AC | PRN
  Administered 2020-11-12: 75 mL via INTRAVENOUS

## 2020-11-18 ENCOUNTER — Telehealth: Payer: Self-pay | Admitting: Cardiology

## 2020-11-18 MED ORDER — CLOPIDOGREL BISULFATE 75 MG PO TABS: 75.0000 mg | ORAL_TABLET | Freq: Every day | ORAL | 1 refills | Status: DC

## 2020-11-18 NOTE — Telephone Encounter (Signed)
New message     *STAT* If patient is at the pharmacy, call can be transferred to refill team.   1. Which medications need to be refilled? (please list name of each medication and dose if known) clopidogrel clopidogrel (PLAVIX) 75 MG tablet  2. Which pharmacy/location (including street and city if local pharmacy) is medication to be sent to? [facility] - [place]  3. Do they need a 30 day or 90 day supply? [place]

## 2020-11-19 ENCOUNTER — Encounter (HOSPITAL_COMMUNITY): Payer: Self-pay | Admitting: Hematology and Oncology

## 2020-11-19 ENCOUNTER — Inpatient Hospital Stay (HOSPITAL_BASED_OUTPATIENT_CLINIC_OR_DEPARTMENT_OTHER): Payer: Medicare HMO | Admitting: Hematology and Oncology

## 2020-11-19 ENCOUNTER — Other Ambulatory Visit: Payer: Self-pay

## 2020-11-19 VITALS — BP 109/90 | HR 70 | Temp 97.5°F | Resp 16 | Wt 136.6 lb

## 2020-11-19 DIAGNOSIS — G894 Chronic pain syndrome: Secondary | ICD-10-CM | POA: Diagnosis not present

## 2020-11-19 DIAGNOSIS — C3492 Malignant neoplasm of unspecified part of left bronchus or lung: Secondary | ICD-10-CM | POA: Diagnosis not present

## 2020-11-19 DIAGNOSIS — I700 Atherosclerosis of aorta: Secondary | ICD-10-CM | POA: Diagnosis not present

## 2020-11-19 DIAGNOSIS — I252 Old myocardial infarction: Secondary | ICD-10-CM | POA: Diagnosis not present

## 2020-11-19 DIAGNOSIS — K219 Gastro-esophageal reflux disease without esophagitis: Secondary | ICD-10-CM | POA: Diagnosis not present

## 2020-11-19 DIAGNOSIS — M62838 Other muscle spasm: Secondary | ICD-10-CM

## 2020-11-19 DIAGNOSIS — M62831 Muscle spasm of calf: Secondary | ICD-10-CM | POA: Diagnosis not present

## 2020-11-19 DIAGNOSIS — I2510 Atherosclerotic heart disease of native coronary artery without angina pectoris: Secondary | ICD-10-CM | POA: Diagnosis not present

## 2020-11-19 DIAGNOSIS — M47816 Spondylosis without myelopathy or radiculopathy, lumbar region: Secondary | ICD-10-CM | POA: Diagnosis not present

## 2020-11-19 DIAGNOSIS — M4726 Other spondylosis with radiculopathy, lumbar region: Secondary | ICD-10-CM | POA: Diagnosis not present

## 2020-11-19 DIAGNOSIS — M48061 Spinal stenosis, lumbar region without neurogenic claudication: Secondary | ICD-10-CM | POA: Diagnosis not present

## 2020-11-19 DIAGNOSIS — M5136 Other intervertebral disc degeneration, lumbar region: Secondary | ICD-10-CM | POA: Diagnosis not present

## 2020-11-19 DIAGNOSIS — E785 Hyperlipidemia, unspecified: Secondary | ICD-10-CM | POA: Diagnosis not present

## 2020-11-19 DIAGNOSIS — J439 Emphysema, unspecified: Secondary | ICD-10-CM | POA: Diagnosis not present

## 2020-11-19 DIAGNOSIS — I119 Hypertensive heart disease without heart failure: Secondary | ICD-10-CM | POA: Diagnosis not present

## 2020-11-19 DIAGNOSIS — J449 Chronic obstructive pulmonary disease, unspecified: Secondary | ICD-10-CM | POA: Diagnosis not present

## 2020-11-19 MED ORDER — CYCLOBENZAPRINE HCL 5 MG PO TABS: 5.0000 mg | ORAL_TABLET | Freq: Three times a day (TID) | ORAL | 0 refills | Status: DC | PRN

## 2020-11-19 NOTE — Assessment & Plan Note (Signed)
I have personally reviewed the CT imaging The left lung mass is stable in size His chronic shortness of breath and cough is likely due to his COPD I recommend repeat imaging study again in 3 months for further follow-up

## 2020-11-19 NOTE — Assessment & Plan Note (Signed)
He has intermittent chronic muscle spasms of unknown etiology I recommend a trial of Flexeril I warned him about risk of sedation

## 2020-11-19 NOTE — Progress Notes (Signed)
[place] FOLLOW-UP progress notes  Patient Care Team: Lysbeth Penner, FNP as PCP - General (Family Medicine) Satira Sark, MD as PCP - Cardiology (Cardiology) Gala Romney Cristopher Estimable, MD as Consulting Physician (Gastroenterology) Brien Mates, RN as Oncology Nurse Navigator (Oncology)  CHIEF COMPLAINTS/PURPOSE OF VISIT:  Lung cancer, for further follow-up  HISTORY OF PRESENTING ILLNESS:  [name] Graham 85 y.o. male is seen because his primary oncologist is not available He is here accompanied by his wife He has chronic shortness of breath and cough but not worse No recent hemoptysis Denies chest pain He is most bothered by leg spasms He is concerned about the cost of his inhalers .  I reviewed the patient's records extensive and collaborated the history with the patient. Summary of his history is as follows: Oncology History  Squamous cell lung cancer, left ([place])  12/08/2016 Initial Diagnosis   Squamous cell lung cancer, left ([place])   11/12/2020 Imaging   1. LEFT infrahilar lesion of similar size compared to most recent study with evolving post treatment changes and diminished basilar nodularity that was seen previously. 2. Basilar nodularity discussed above likely post inflammatory or infectious, attention on follow-up. No new or progressive findings. 3. Diminishing size of mediastinal lymph nodes and particularly the dominant node anterior to the carina. 4. Three-vessel coronary artery disease. 5. Emphysema and aortic atherosclerosis.       MEDICAL HISTORY:  Past Medical History:  Diagnosis Date   Arthritis    COPD (chronic obstructive pulmonary disease) ([place])    Coronary atherosclerosis of native coronary artery    BMS circ and PTCA PDA 2007, DES RCA 10/09, DES LAD 2/12   Dyspnea    PULMONARY NODULE    Essential hypertension    GERD (gastroesophageal reflux disease)    Hearing loss    no hearing aids   Heart attack ([place])    x 3 (latest 2009)    History of kidney stones    passed stones   Hyperlipidemia    Ischemic cardiomyopathy    NSTEMI (non-ST elevated myocardial infarction) ([place])    2007   Pulmonary nodule 1 cm or greater in diameter 02/2020   Dr Lamonte Sakai    SURGICAL HISTORY: Past Surgical History:  Procedure Laterality Date   ANGIOPLASTY     stent   BRONCHIAL BIOPSY  02/19/2020   Procedure: BRONCHIAL BIOPSIES;  Surgeon: Collene Gobble, MD;  Location: [facility] ENDOSCOPY;  Service: Pulmonary;;   BRONCHIAL BRUSHINGS  02/19/2020   Procedure: BRONCHIAL BRUSHINGS;  Surgeon: Collene Gobble, MD;  Location: [facility] ENDOSCOPY;  Service: Pulmonary;;   BRONCHIAL NEEDLE ASPIRATION BIOPSY  02/19/2020   Procedure: BRONCHIAL NEEDLE ASPIRATION BIOPSIES;  Surgeon: Collene Gobble, MD;  Location: [facility] ENDOSCOPY;  Service: Pulmonary;;   COLONOSCOPY     FIDUCIAL MARKER PLACEMENT  02/19/2020   Procedure: FIDUCIAL MARKER PLACEMENT;  Surgeon: Collene Gobble, MD;  Location: [facility] ENDOSCOPY;  Service: Pulmonary;;   HERNIA REPAIR     LIPOMA EXCISION Right    hip/buttox   TONSILLECTOMY     as a child   VIDEO BRONCHOSCOPY WITH ENDOBRONCHIAL NAVIGATION N/A 02/19/2020   Procedure: VIDEO BRONCHOSCOPY WITH ENDOBRONCHIAL NAVIGATION;  Surgeon: Collene Gobble, MD;  Location: [facility] ENDOSCOPY;  Service: Pulmonary;  Laterality: N/A;    SOCIAL HISTORY: Social History   Socioeconomic History   Marital status: Divorced    Spouse name: Not on file   Number of children: Not on file   Years of  education: Not on file   Highest education level: Not on file  Occupational History   Occupation: Retired  Tobacco Use   Smoking status: Every Day    Packs/day: 0.50    Years: 63.00    Pack years: 31.50    Types: Cigarettes    Start date: 06/15/1953   Smokeless tobacco: Never   Tobacco comments:    4 cigarettes smoked daily 03/05/20 ARJ   Vaping Use   Vaping Use: Never used  Substance and Sexual Activity   Alcohol use: No    Alcohol/week: 0.0 standard drinks   Drug  use: No   Sexual activity: Not Currently  Other Topics Concern   Not on file  Social History Narrative   Not on file   Social Determinants of Health   Financial Resource Strain: Low Risk    Difficulty of Paying Living Expenses: Not hard at all  Food Insecurity: No Food Insecurity   Worried About Charity fundraiser in the Last Year: Never true   Arboriculturist in the Last Year: Never true  Transportation Needs: No Transportation Needs   Lack of Transportation (Medical): No   Lack of Transportation (Non-Medical): No  Physical Activity: Insufficiently Active   Days of Exercise per Week: 7 days   Minutes of Exercise per Session: 10 min  Stress: Stress Concern Present   Feeling of Stress : Very much  Social Connections: Socially Isolated   Frequency of Communication with Friends and Family: More than three times a week   Frequency of Social Gatherings with Friends and Family: More than three times a week   Attends Religious Services: Never   Marine scientist or Organizations: No   Attends Music therapist: Never   Marital Status: Divorced  Human resources officer Violence: Not At Risk   Fear of Current or Ex-Partner: No   Emotionally Abused: No   Physically Abused: No   Sexually Abused: No    FAMILY HISTORY: Family History  Problem Relation Age of Onset   Throat cancer Father    Heart attack Mother    Alzheimer's disease Mother     ALLERGIES:  is allergic to entresto [sacubitril-valsartan].  MEDICATIONS:  Current Outpatient Medications  Medication Sig Dispense Refill   albuterol (VENTOLIN HFA) 108 (90 Base) MCG/ACT inhaler Inhale into the lungs every 6 (six) hours as needed for wheezing or shortness of breath.     amLODipine (NORVASC) 5 MG tablet Take 1 tablet (5 mg total) by mouth daily. 90 tablet 3   aspirin EC 81 MG tablet Take 1 tablet (81 mg total) by mouth daily. 90 tablet 3   clopidogrel (PLAVIX) 75 MG tablet Take 1 tablet (75 mg total) by mouth  daily. 90 tablet 1   cyclobenzaprine (FLEXERIL) 5 MG tablet Take 1 tablet (5 mg total) by mouth 3 (three) times daily as needed for muscle spasms. 60 tablet 0   Ensure (ENSURE) Take 237 mLs by mouth 2 (two) times daily.     fluticasone (FLONASE) 50 MCG/ACT nasal spray Place into both nostrils 3 (three) times daily as needed for allergies or rhinitis.     HYDROcodone-acetaminophen (NORCO) 7.5-325 MG tablet Take 1 tablet by mouth daily as needed.     metoprolol succinate (TOPROL XL) 25 MG 24 hr tablet Take 1 tablet (25 mg total) by mouth daily. 90 tablet 3   Naphazoline HCl (CLEAR EYES OP) Place 1 drop into both eyes daily as needed (irritation / redness).  nitroGLYCERIN (NITROSTAT) 0.4 MG SL tablet DISSOLVE ONE TABLET UNDER THE TONGUE EVERY 5 MINUTES AS NEEDED FOR CHEST PAIN.  DO NOT EXCEED A TOTAL OF 3 DOSES IN 15 MINUTES 25 tablet 3   olmesartan (BENICAR) 40 MG tablet Take 1 tablet (40 mg total) by mouth daily. 30 tablet 11   OXYGEN Inhale into the lungs.     simvastatin (ZOCOR) 80 MG tablet Take 1 tablet (80 mg total) by mouth at bedtime. 90 tablet 1   umeclidinium bromide (INCRUSE ELLIPTA) 62.5 MCG/INH AEPB Inhale 1 puff into the lungs daily. 30 each 11   Tiotropium Bromide Monohydrate (SPIRIVA RESPIMAT) 2.5 MCG/ACT AERS Inhale 2 puffs into the lungs daily. (Patient not taking: Reported on 11/19/2020) 4 g 11   No current facility-administered medications for this visit.    REVIEW OF SYSTEMS:   Constitutional: Denies fevers, chills or abnormal night sweats Eyes: Denies blurriness of vision, double vision or watery eyes Ears, nose, mouth, throat, and face: Denies mucositis or sore throat Respiratory: Denies cough, dyspnea or wheezes Cardiovascular: Denies palpitation, chest discomfort or lower extremity swelling Gastrointestinal:  Denies nausea, heartburn or change in bowel habits Skin: Denies abnormal skin rashes Lymphatics: Denies new lymphadenopathy or easy  bruising Neurological:Denies numbness, tingling or new weaknesses Behavioral/Psych: Mood is stable, no new changes  All other systems were reviewed with the patient and are negative.  PHYSICAL EXAMINATION: ECOG PERFORMANCE STATUS: 1 - Symptomatic but completely ambulatory  Vitals:   11/19/20 1142  BP: 109/90  Pulse: 70  Resp: 16  Temp: (!) 97.5 F (36.4 C)  SpO2: 95%   Filed Weights   11/19/20 1142  Weight: 136 lb 9.6 oz (62 kg)    GENERAL:alert, no distress and comfortable SKIN: skin color, texture, turgor are normal, no rashes or significant lesions EYES: normal, conjunctiva are pink and non-injected, sclera clear OROPHARYNX:no exudate, normal lips, buccal mucosa, and tongue  NECK: supple, thyroid normal size, non-tender, without nodularity LYMPH:  no palpable lymphadenopathy in the cervical, axillary or inguinal LUNGS: clear to auscultation and percussion with normal breathing effort HEART: regular rate & rhythm and no murmurs without lower extremity edema ABDOMEN:abdomen soft, non-tender and normal bowel sounds Musculoskeletal:no cyanosis of digits and no clubbing  PSYCH: alert & oriented x 3 with fluent speech NEURO: no focal motor/sensory deficits  LABORATORY DATA:  I have reviewed the data as listed Lab Results  Component Value Date   WBC 7.3 11/12/2020   HGB 16.4 11/12/2020   HCT 48.1 11/12/2020   MCV 97.8 11/12/2020   PLT 188 11/12/2020   Recent Labs    02/07/20 0911 07/09/20 0944 11/12/20 1007  NA 135 138 135  K 4.5 4.2 4.5  CL 102 106 102  CO2 25 24 25   GLUCOSE 96 122* 103*  BUN 21 20 15   CREATININE 1.08 1.04 1.08  CALCIUM 9.4 9.0 9.2  GFRNONAA >60 >60 >60  PROT 7.5 7.1 7.5  ALBUMIN 4.0 3.7 4.1  AST 18 19 19   ALT 14 14 13   ALKPHOS 45 51 53  BILITOT 0.6 0.7 0.9    RADIOGRAPHIC STUDIES: I have personally reviewed the radiological images as listed and agreed with the findings in the report. CT Chest W Contrast  Result Date:  11/13/2020 CLINICAL DATA:  Non-small cell lung cancer, follow-up for LEFT-sided squamous cell lung cancer post radiation in a 85 year old male. EXAM: CT CHEST WITH CONTRAST TECHNIQUE: Multidetector CT imaging of the chest was performed during intravenous contrast administration. CONTRAST:  76mL  OMNIPAQUE IOHEXOL 300 MG/ML  SOLN COMPARISON:  July 09, 2020. FINDINGS: Cardiovascular: Three-vessel coronary artery disease. No pericardial effusion. Nodular area at the LEFT lung base abuts the posterior LEFT cardiac border (image 119/2) see below for further detail. Calcified and noncalcified plaque of the thoracic aorta without aneurysmal dilation similar to the prior study. Central pulmonary vasculature is normal caliber. Mediastinum/Nodes: Thoracic inlet structures are normal. No axillary lymphadenopathy. Small precarinal lymph node 11 mm short axis previously approximately 13 mm short axis. No hilar lymphadenopathy. Lungs/Pleura: Biapical pleural and parenchymal scarring showing a similar appearance to prior imaging. Moderate to marked centrilobular predominant pulmonary emphysema with similar appearance. LEFT infrahilar lesion abuts the posterior LEFT cardiac border with similar appearance, this measures approximately 2.1 x 1.6 cm, size is similar when measured in a similar fashion, within 1 mm of previous size by this observer on the prior study. Septal thickening extends peripheral to this area. The nodular opacity in the LEFT costodiaphragmatic sulcus has diminished in size since the prior study measuring approximately 11 mm greatest axial dimension as compared to 15 mm when measured in a similar fashion. Fiducial markers within the lung parenchyma about the LEFT hilum similar to the prior exam. No effusion. No consolidation elsewhere in the chest. Mild bronchial wall thickening with some improvement. Upper Abdomen: Atheromatous plaque extends into the upper abdominal aorta. No acute process related upper abdominal  viscera. The adrenal glands are normal. Musculoskeletal: No acute musculoskeletal process. Spinal degenerative changes. No destructive bone finding. IMPRESSION: 1. LEFT infrahilar lesion of similar size compared to most recent study with evolving post treatment changes and diminished basilar nodularity that was seen previously. 2. Basilar nodularity discussed above likely post inflammatory or infectious, attention on follow-up. No new or progressive findings. 3. Diminishing size of mediastinal lymph nodes and particularly the dominant node anterior to the carina. 4. Three-vessel coronary artery disease. 5. Emphysema and aortic atherosclerosis. Aortic Atherosclerosis (ICD10-I70.0) and Emphysema (ICD10-J43.9). Electronically Signed   By: Zetta Bills M.D.   On: 11/13/2020 16:40   ECHOCARDIOGRAM COMPLETE  Result Date: 10/31/2020    ECHOCARDIOGRAM REPORT   Patient Name:   [name] Graham Date of Exam: 10/31/2020 Medical Rec #:  [id number]         Height:       70.0 in Accession #:    [id number]        Weight:       144.4 lb Date of Birth:  Mar 23, 1936          BSA:          1.817 m Patient Age:    75 years          BP:           111/70 mmHg Patient Gender: M                 HR:           61 bpm. Exam Location:  [facility] Procedure: 2D Echo, Cardiac Doppler and Color Doppler Indications:    I25.5 (ICD-10-CM) - Ischemic cardiomyopathy  History:        Patient has prior history of Echocardiogram examinations, most                 recent 06/07/2018. Previous Myocardial Infarction, COPD; Risk                 Factors:Dyslipidemia and Hypertension. NSTEMI (non-ST elevated  myocardial infarction). Squamous cell lung cancer, left ([place]).                 Cigarette smoker per chart.  Sonographer:    Alvino Chapel RCS Referring Phys: [place]  1. Left ventricular ejection fraction, by estimation, is 40 to 45%. The left ventricle has mildly decreased function. The left ventricle demonstrates  regional wall motion abnormalities (see scoring diagram/findings for description). There is moderate left ventricular hypertrophy. Left ventricular diastolic parameters are consistent with Grade I diastolic dysfunction (impaired relaxation).  2. Right ventricular systolic function is normal. The right ventricular size is normal. Tricuspid regurgitation signal is inadequate for assessing PA pressure.  3. The mitral valve is grossly normal. Mild mitral valve regurgitation.  4. The aortic valve is tricuspid. Aortic valve regurgitation is not visualized. Mild aortic valve sclerosis is present, with no evidence of aortic valve stenosis.  5. Aortic dilatation noted. There is mild dilatation of the aortic root, measuring 40 mm.  6. The inferior vena cava is normal in size with greater than 50% respiratory variability, suggesting right atrial pressure of 3 mmHg. FINDINGS  Left Ventricle: Left ventricular ejection fraction, by estimation, is 40 to 45%. The left ventricle has mildly decreased function. The left ventricle demonstrates regional wall motion abnormalities. The left ventricular internal cavity size was normal in size. There is moderate left ventricular hypertrophy. Left ventricular diastolic parameters are consistent with Grade I diastolic dysfunction (impaired relaxation).  LV Wall Scoring: The apical septal segment, apical inferior segment, and apex are hypokinetic. The entire anterior wall, entire lateral wall, anterior septum, inferior wall, mid inferoseptal segment, and basal inferoseptal segment are normal. Right Ventricle: The right ventricular size is normal. No increase in right ventricular wall thickness. Right ventricular systolic function is normal. Tricuspid regurgitation signal is inadequate for assessing PA pressure. Left Atrium: Left atrial size was normal in size. Right Atrium: Right atrial size was normal in size. Pericardium: There is no evidence of pericardial effusion. Mitral Valve: The mitral  valve is grossly normal. There is mild thickening of the mitral valve leaflet(s). Mild mitral valve regurgitation. Tricuspid Valve: The tricuspid valve is grossly normal. Tricuspid valve regurgitation is trivial. Aortic Valve: The aortic valve is tricuspid. There is mild aortic valve annular calcification. Aortic valve regurgitation is not visualized. Mild aortic valve sclerosis is present, with no evidence of aortic valve stenosis. Pulmonic Valve: The pulmonic valve was grossly normal. Pulmonic valve regurgitation is trivial. Aorta: Aortic dilatation noted. There is mild dilatation of the aortic root, measuring 40 mm. Venous: The inferior vena cava is normal in size with greater than 50% respiratory variability, suggesting right atrial pressure of 3 mmHg. IAS/Shunts: No atrial level shunt detected by color flow Doppler.  LEFT VENTRICLE PLAX 2D LVIDd:         5.00 cm  Diastology LVIDs:         3.60 cm  LV e' medial:    4.90 cm/s LV PW:         1.20 cm  LV E/e' medial:  9.9 LV IVS:        1.30 cm  LV e' lateral:   4.68 cm/s LVOT diam:     2.30 cm  LV E/e' lateral: 10.3 LV SV:         59 LV SV Index:   32 LVOT Area:     4.15 cm  RIGHT VENTRICLE RV S prime:     10.80 cm/s TAPSE (M-mode): 1.6  cm LEFT ATRIUM             Index       RIGHT ATRIUM           Index LA diam:        2.60 cm 1.43 cm/m  RA Area:     14.10 cm LA Vol (A2C):   39.5 ml 21.73 ml/m RA Volume:   35.20 ml  19.37 ml/m LA Vol (A4C):   30.3 ml 16.67 ml/m LA Biplane Vol: 35.6 ml 19.59 ml/m  AORTIC VALVE LVOT Vmax:   65.30 cm/s LVOT Vmean:  41.600 cm/s LVOT VTI:    0.142 m  AORTA Ao Root diam: 4.00 cm MITRAL VALVE MV Area (PHT): 2.94 cm    SHUNTS MV Decel Time: 258 msec    Systemic VTI:  0.14 m MV E velocity: 48.40 cm/s  Systemic Diam: 2.30 cm MV A velocity: 66.70 cm/s MV E/A ratio:  0.73 Rozann Lesches MD Electronically signed by Rozann Lesches MD Signature Date/Time: 10/31/2020/3:19:19 PM    Final     ASSESSMENT & PLAN:  Squamous cell lung  cancer, left ([place]) I have personally reviewed the CT imaging The left lung mass is stable in size His chronic shortness of breath and cough is likely due to his COPD I recommend repeat imaging study again in 3 months for further follow-up  COPD  GOLD II/ still smoking  He is concerned about the cost of his inhalers I recommend the patient to call his pulmonologist for medication changes if needed  Muscle spasm of both lower legs He has intermittent chronic muscle spasms of unknown etiology I recommend a trial of Flexeril I warned him about risk of sedation  Orders Placed This Encounter  Procedures   CT CHEST W CONTRAST    Standing Status:   Future    Standing Expiration Date:   11/19/2021    Order Specific Question:   If indicated for the ordered procedure, I authorize the administration of contrast media per Radiology protocol    Answer:   Yes    Order Specific Question:   Preferred imaging location?    Answer:   [facility]    Order Specific Question:   Radiology Contrast Protocol - do NOT remove file path    Answer:   [email or web address]    All questions were answered. The patient knows to call the clinic with any problems, questions or concerns. The total time spent in the appointment was 20 minutes encounter with patients including review of chart and various tests results, discussions about plan of care and coordination of care plan   Heath Lark, MD 11/19/2020 12:11 PM

## 2020-11-19 NOTE — Assessment & Plan Note (Signed)
He is concerned about the cost of his inhalers I recommend the patient to call his pulmonologist for medication changes if needed

## 2020-11-24 ENCOUNTER — Other Ambulatory Visit (HOSPITAL_COMMUNITY): Payer: Self-pay

## 2020-11-24 DIAGNOSIS — M62838 Other muscle spasm: Secondary | ICD-10-CM

## 2020-11-24 DIAGNOSIS — C3492 Malignant neoplasm of unspecified part of left bronchus or lung: Secondary | ICD-10-CM

## 2020-12-05 DIAGNOSIS — I2510 Atherosclerotic heart disease of native coronary artery without angina pectoris: Secondary | ICD-10-CM | POA: Diagnosis not present

## 2020-12-05 DIAGNOSIS — J449 Chronic obstructive pulmonary disease, unspecified: Secondary | ICD-10-CM | POA: Diagnosis not present

## 2021-01-03 DIAGNOSIS — J441 Chronic obstructive pulmonary disease with (acute) exacerbation: Secondary | ICD-10-CM | POA: Diagnosis not present

## 2021-01-03 DIAGNOSIS — R5383 Other fatigue: Secondary | ICD-10-CM | POA: Diagnosis not present

## 2021-01-03 DIAGNOSIS — R0602 Shortness of breath: Secondary | ICD-10-CM | POA: Diagnosis not present

## 2021-01-03 DIAGNOSIS — R059 Cough, unspecified: Secondary | ICD-10-CM | POA: Diagnosis not present

## 2021-01-05 DIAGNOSIS — J449 Chronic obstructive pulmonary disease, unspecified: Secondary | ICD-10-CM | POA: Diagnosis not present

## 2021-01-05 DIAGNOSIS — I2510 Atherosclerotic heart disease of native coronary artery without angina pectoris: Secondary | ICD-10-CM | POA: Diagnosis not present

## 2021-02-04 DIAGNOSIS — J449 Chronic obstructive pulmonary disease, unspecified: Secondary | ICD-10-CM | POA: Diagnosis not present

## 2021-02-04 DIAGNOSIS — I2510 Atherosclerotic heart disease of native coronary artery without angina pectoris: Secondary | ICD-10-CM | POA: Diagnosis not present

## 2021-02-18 ENCOUNTER — Inpatient Hospital Stay (HOSPITAL_COMMUNITY): Payer: Medicare HMO

## 2021-02-18 ENCOUNTER — Ambulatory Visit (HOSPITAL_COMMUNITY): Admission: RE | Admit: 2021-02-18 | Payer: Medicare HMO | Source: Ambulatory Visit

## 2021-02-18 ENCOUNTER — Other Ambulatory Visit (HOSPITAL_COMMUNITY): Payer: Self-pay | Admitting: *Deleted

## 2021-02-18 MED ORDER — CYCLOBENZAPRINE HCL 5 MG PO TABS: 5.0000 mg | ORAL_TABLET | Freq: Three times a day (TID) | ORAL | 0 refills | Status: DC | PRN

## 2021-02-23 ENCOUNTER — Ambulatory Visit (HOSPITAL_COMMUNITY): Payer: Medicare HMO | Admitting: Hematology

## 2021-02-24 DIAGNOSIS — M48061 Spinal stenosis, lumbar region without neurogenic claudication: Secondary | ICD-10-CM | POA: Diagnosis not present

## 2021-02-24 DIAGNOSIS — G894 Chronic pain syndrome: Secondary | ICD-10-CM | POA: Diagnosis not present

## 2021-02-24 DIAGNOSIS — M5136 Other intervertebral disc degeneration, lumbar region: Secondary | ICD-10-CM | POA: Diagnosis not present

## 2021-02-24 DIAGNOSIS — M4726 Other spondylosis with radiculopathy, lumbar region: Secondary | ICD-10-CM | POA: Diagnosis not present

## 2021-02-24 DIAGNOSIS — Z79891 Long term (current) use of opiate analgesic: Secondary | ICD-10-CM | POA: Diagnosis not present

## 2021-03-07 DIAGNOSIS — I2510 Atherosclerotic heart disease of native coronary artery without angina pectoris: Secondary | ICD-10-CM | POA: Diagnosis not present

## 2021-03-07 DIAGNOSIS — J449 Chronic obstructive pulmonary disease, unspecified: Secondary | ICD-10-CM | POA: Diagnosis not present

## 2021-03-18 ENCOUNTER — Ambulatory Visit (HOSPITAL_COMMUNITY)
Admission: RE | Admit: 2021-03-18 | Discharge: 2021-03-18 | Disposition: A | Discharge status: Home / Self Care | Payer: Medicare HMO | Source: Ambulatory Visit | Attending: Hematology and Oncology | Admitting: Hematology and Oncology

## 2021-03-18 ENCOUNTER — Other Ambulatory Visit: Payer: Self-pay

## 2021-03-18 ENCOUNTER — Inpatient Hospital Stay (HOSPITAL_COMMUNITY): Payer: Medicare HMO | Attending: Hematology

## 2021-03-18 DIAGNOSIS — C3492 Malignant neoplasm of unspecified part of left bronchus or lung: Secondary | ICD-10-CM

## 2021-03-18 DIAGNOSIS — I700 Atherosclerosis of aorta: Secondary | ICD-10-CM | POA: Diagnosis not present

## 2021-03-18 DIAGNOSIS — C3490 Malignant neoplasm of unspecified part of unspecified bronchus or lung: Secondary | ICD-10-CM | POA: Diagnosis not present

## 2021-03-18 DIAGNOSIS — J439 Emphysema, unspecified: Secondary | ICD-10-CM | POA: Diagnosis not present

## 2021-03-18 LAB — COMPREHENSIVE METABOLIC PANEL WITH GFR
ALT: 12 U/L (ref 0–44)
AST: 18 U/L (ref 15–41)
Albumin: 4.1 g/dL (ref 3.5–5.0)
Alkaline Phosphatase: 56 U/L (ref 38–126)
Anion gap: 7 (ref 5–15)
BUN: 22 mg/dL (ref 8–23)
CO2: 23 mmol/L (ref 22–32)
Calcium: 8.9 mg/dL (ref 8.9–10.3)
Chloride: 106 mmol/L (ref 98–111)
Creatinine, Ser: 1.02 mg/dL (ref 0.61–1.24)
GFR, Estimated: >60 mL/min
Glucose, Bld: 97 mg/dL (ref 70–99)
Potassium: 4.1 mmol/L (ref 3.5–5.1)
Sodium: 136 mmol/L (ref 135–145)
Total Bilirubin: 0.6 mg/dL (ref 0.3–1.2)
Total Protein: 7.4 g/dL (ref 6.5–8.1)

## 2021-03-18 LAB — CBC WITH DIFFERENTIAL/PLATELET
Abs Immature Granulocytes: 0.01 10*3/uL (ref 0.00–0.07)
Basophils Absolute: 0.1 10*3/uL (ref 0.0–0.1)
Basophils Relative: 1 %
Eosinophils Absolute: 0.2 10*3/uL (ref 0.0–0.5)
Eosinophils Relative: 3 %
HCT: 45.7 % (ref 39.0–52.0)
Hemoglobin: 15.5 g/dL (ref 13.0–17.0)
Immature Granulocytes: 0 %
Lymphocytes Relative: 25 %
Lymphs Abs: 1.7 10*3/uL (ref 0.7–4.0)
MCH: 32.7 pg (ref 26.0–34.0)
MCHC: 33.9 g/dL (ref 30.0–36.0)
MCV: 96.4 fL (ref 80.0–100.0)
Monocytes Absolute: 0.6 10*3/uL (ref 0.1–1.0)
Monocytes Relative: 9 %
Neutro Abs: 4.3 10*3/uL (ref 1.7–7.7)
Neutrophils Relative %: 62 %
Platelets: 209 10*3/uL (ref 150–400)
RBC: 4.74 MIL/uL (ref 4.22–5.81)
RDW: 12.6 % (ref 11.5–15.5)
WBC: 6.8 10*3/uL (ref 4.0–10.5)
nRBC: 0 % (ref 0.0–0.2)

## 2021-03-18 MED ORDER — IOHEXOL 300 MG/ML  SOLN
75.0000 mL | Freq: Once | INTRAMUSCULAR | Status: AC | PRN
  Administered 2021-03-18: 75 mL via INTRAVENOUS

## 2021-03-29 NOTE — Progress Notes (Signed)
[name] Graham, [place] [id number]   CLINIC:  Medical Oncology/Hematology  PCP:  [name] Graham, [place] / [place] [address] [phone]   REASON FOR VISIT:  Follow-up for left squamous cell lung cancer  PRIOR THERAPY: Left lung SBRT 50 Gy in 5 fractions from 04/14/2020 to 04/24/2020  NGS Results: not done  CURRENT THERAPY: surveillance  BRIEF ONCOLOGIC HISTORY:  Oncology History  Squamous cell lung cancer, left ([name] Graham)  12/08/2016 Initial Diagnosis   Squamous cell lung cancer, left ([name] Graham)   11/12/2020 Imaging   1. LEFT infrahilar lesion of similar size compared to most recent study with evolving post treatment changes and diminished basilar nodularity that was seen previously. 2. Basilar nodularity discussed above likely post inflammatory or infectious, attention on follow-up. No new or progressive findings. 3. Diminishing size of mediastinal lymph nodes and particularly the dominant node anterior to the carina. 4. Three-vessel coronary artery disease. 5. Emphysema and aortic atherosclerosis.       CANCER STAGING:  Cancer Staging  No matching staging information was found for the patient.  INTERVAL HISTORY:  [name] Graham, a 85 y.o. male, returns for routine follow-up of his left squamous cell lung cancer. [name] was last seen on 07/16/2020.   Today he reports feeling good, and he is accompanied by his son. He reports continued SOB. He denies hemoptysis and weight loss. His appetite is good. He is still smoking.   REVIEW OF SYSTEMS:  Review of Systems  Constitutional:  Negative for appetite change, fatigue (80%) and unexpected weight change.  Respiratory:  Positive for shortness of breath. Negative for hemoptysis.   Neurological:  Positive for dizziness.  All other systems reviewed and are negative.  PAST MEDICAL/SURGICAL HISTORY:  Past Medical History:  Diagnosis Date   Arthritis    COPD (chronic  obstructive pulmonary disease) ([place])    Coronary atherosclerosis of native coronary artery    BMS circ and PTCA PDA 2007, DES RCA 10/09, DES LAD 2/12   Dyspnea    PULMONARY NODULE    Essential hypertension    GERD (gastroesophageal reflux disease)    Hearing loss    no hearing aids   Heart attack ([place])    x 3 (latest 2009)   History of kidney stones    passed stones   Hyperlipidemia    Ischemic cardiomyopathy    NSTEMI (non-ST elevated myocardial infarction) ([place])    2007   Pulmonary nodule 1 cm or greater in diameter 02/2020   Dr Lamonte Sakai   Past Surgical History:  Procedure Laterality Date   ANGIOPLASTY     stent   BRONCHIAL BIOPSY  02/19/2020   Procedure: BRONCHIAL BIOPSIES;  Surgeon: Collene Gobble, MD;  [facility] END[facility];  Service: Pulmonary;;   BRONCHIAL BRUSHINGS  02/19/2020   Procedure: BRONCHIAL BRUSHINGS;  Surgeon: Collene Gobble, MD;  Location: [facility] ENDOSCOPY;  Service: Pulmonary;;   BRONCHIAL NEEDLE ASPIRATION BIOPSY  02/19/2020   Procedure: BRONCHIAL NEEDLE ASPIRATION BIOPSIES;  Surgeon: Collene Gobble, MD;  Location: [facility] ENDOSCOPY;  Service: Pulmonary;;   COLONOSCOPY     FIDUCIAL MARKER PLACEMENT  02/19/2020   Procedure: FIDUCIAL MARKER PLACEMENT;  Surgeon: Collene Gobble, MD;  Location: [facility] ENDOSCOPY;  Service: Pulmonary;;   HERNIA REPAIR     LIPOMA EXCISION Right    hip/buttox   TONSILLECTOMY     as a child   VIDEO BRONCHOSCOPY WITH ENDOBRONCHIAL NAVIGATION N/A 02/19/2020  Procedure: VIDEO BRONCHOSCOPY WITH ENDOBRONCHIAL NAVIGATION;  Surgeon: Collene Gobble, MD;  Location: [facility] ENDOSCOPY;  Service: Pulmonary;  Laterality: N/A;    SOCIAL HISTORY:  Social History   Socioeconomic History   Marital status: Divorced    Spouse name: Not on file   Number of children: Not on file   Years of education: Not on file   Highest education level: Not on file  Occupational History   Occupation: Retired  Tobacco Use   Smoking status: Every Day    Packs/day: 0.50     Years: 63.00    Pack years: 31.50    Types: Cigarettes    Start date: 06/[birth year]/1953   Smokeless tobacco: Never   Tobacco comments:    4 cigarettes smoked daily 03/05/20 ARJ   Vaping Use   Vaping Use: Never used  Substance and Sexual Activity   Alcohol use: No    Alcohol/week: 0.0 standard drinks   Drug use: No   Sexual activity: Not Currently  Other Topics Concern   Not on file  Social History Narrative   Not on file   Social Determinants of Health   Financial Resource Strain: Not on file  Food Insecurity: Not on file  Transportation Needs: Not on file  Physical Activity: Not on file  Stress: Not on file  Social Connections: Not on file  Intimate Partner Violence: Not on file    FAMILY HISTORY:  Family History  Problem Relation Age of Onset   Throat cancer Father    Heart attack Mother    Alzheimer's disease Mother     CURRENT MEDICATIONS:  Current Outpatient Medications  Medication Sig Dispense Refill   albuterol (VENTOLIN HFA) 108 (90 Base) MCG/ACT inhaler Inhale into the lungs every 6 (six) hours as needed for wheezing or shortness of breath.     aspirin EC 81 MG tablet Take 1 tablet (81 mg total) by mouth daily. 90 tablet 3   clopidogrel (PLAVIX) 75 MG tablet Take 1 tablet (75 mg total) by mouth daily. 90 tablet 1   cyclobenzaprine (FLEXERIL) 5 MG tablet Take 1 tablet (5 mg total) by mouth 3 (three) times daily as needed for muscle spasms. 90 tablet 0   Ensure (ENSURE) Take 237 mLs by mouth 2 (two) times daily.     fluticasone (FLONASE) 50 MCG/ACT nasal spray Place into both nostrils 3 (three) times daily as needed for allergies or rhinitis.     HYDROcodone-acetaminophen (NORCO) 7.5-325 MG tablet Take 1 tablet by mouth daily as needed.     metoprolol succinate (TOPROL XL) 25 MG 24 hr tablet Take 1 tablet (25 mg total) by mouth daily. 90 tablet 3   Naphazoline HCl (CLEAR EYES OP) Place 1 drop into both eyes daily as needed (irritation / redness).     olmesartan  (BENICAR) 40 MG tablet Take 1 tablet (40 mg total) by mouth daily. 30 tablet 11   OXYGEN Inhale into the lungs.     simvastatin (ZOCOR) 80 MG tablet Take 1 tablet (80 mg total) by mouth at bedtime. 90 tablet 1   Tiotropium Bromide Monohydrate (SPIRIVA RESPIMAT) 2.5 MCG/ACT AERS Inhale 2 puffs into the lungs daily. 4 g 11   umeclidinium bromide (INCRUSE ELLIPTA) 62.5 MCG/INH AEPB Inhale 1 puff into the lungs daily. 30 each 11   amLODipine (NORVASC) 5 MG tablet Take 1 tablet (5 mg total) by mouth daily. 90 tablet 3   nitroGLYCERIN (NITROSTAT) 0.4 MG SL tablet DISSOLVE ONE TABLET UNDER THE TONGUE EVERY  5 MINUTES AS NEEDED FOR CHEST PAIN.  DO NOT EXCEED A TOTAL OF 3 DOSES IN 15 MINUTES (Patient not taking: Reported on 03/30/2021) 25 tablet 3   No current facility-administered medications for this visit.    ALLERGIES:  Allergies  Allergen Reactions   Entresto [Sacubitril-Valsartan] Itching    PHYSICAL EXAM:  Performance status (ECOG): 2 - Symptomatic, <50% confined to bed  Vitals:   03/30/21 1347  BP: (!) 144/76  Pulse: 75  Resp: 17  Temp: (!) 96.8 F (36 C)  SpO2: 96%   Wt Readings from Last 3 Encounters:  03/30/21 137 lb 11.2 oz (62.5 kg)  11/19/20 136 lb 9.6 oz (62 kg)  09/23/20 144 lb 6.4 oz (65.5 kg)   Physical Exam Vitals reviewed.  Constitutional:      Appearance: Normal appearance.  Cardiovascular:     Rate and Rhythm: Normal rate and regular rhythm.     Pulses: Normal pulses.     Heart sounds: Normal heart sounds.  Pulmonary:     Effort: Pulmonary effort is normal.     Breath sounds: Normal breath sounds.  Musculoskeletal:     Right lower leg: No edema.     Left lower leg: No edema.  Neurological:     General: No focal deficit present.     Mental Status: He is alert and oriented to person, place, and time.  Psychiatric:        Mood and Affect: Mood normal.        Behavior: Behavior normal.     LABORATORY DATA:  I have reviewed the labs as listed.  CBC  Latest Ref Rng & Units 03/18/2021 11/12/2020 07/09/2020  WBC 4.0 - 10.5 K/uL 6.8 7.3 5.4  Hemoglobin 13.0 - 17.0 g/dL 15.5 16.4 15.5  Hematocrit 39.0 - 52.0 % 45.7 48.1 46.1  Platelets 150 - 400 K/uL 209 188 196   CMP Latest Ref Rng & Units 03/18/2021 11/12/2020 07/09/2020  Glucose 70 - 99 mg/dL 97 103(H) 122(H)  BUN 8 - 23 mg/dL 22 15 20   Creatinine 0.61 - 1.24 mg/dL 1.02 1.08 1.04  Sodium 135 - 145 mmol/L 136 135 138  Potassium 3.5 - 5.1 mmol/L 4.1 4.5 4.2  Chloride 98 - 111 mmol/L 106 102 106  CO2 22 - 32 mmol/L 23 25 24   Calcium 8.9 - 10.3 mg/dL 8.9 9.2 9.0  Total Protein 6.5 - 8.1 g/dL 7.4 7.5 7.1  Total Bilirubin 0.3 - 1.2 mg/dL 0.6 0.9 0.7  Alkaline Phos 38 - 126 U/L 56 53 51  AST 15 - 41 U/L 18 19 19   ALT 0 - 44 U/L 12 13 14     DIAGNOSTIC IMAGING:  I have independently reviewed the scans and discussed with the patient. CT CHEST W CONTRAST  Result Date: 03/19/2021 CLINICAL DATA:  Left lower lobe squamous cell lung cancer, status post radiation therapy. EXAM: CT CHEST WITH CONTRAST TECHNIQUE: Multidetector CT imaging of the chest was performed during intravenous contrast administration. CONTRAST:  35mL OMNIPAQUE IOHEXOL 300 MG/ML  SOLN COMPARISON:  11/12/2020 FINDINGS: Cardiovascular: Mixed aortic atherosclerosis. Normal heart size. Three-vessel coronary artery calcifications and/or stents. No pericardial effusion. Mediastinum/Nodes: Unchanged prominent mediastinal and left hilar lymph nodes, largest pretracheal node measuring 2.0 x 1.2 cm (series 2, image 72). Thyroid gland, trachea, and esophagus demonstrate no significant findings. Lungs/Pleura: Moderate to severe centrilobular emphysema. Diffuse bilateral bronchial wall thickening. Biapical pleuroparenchymal scarring. Slight interval enlargement and increased fullness of a spiculated mass of the medial infrahilar left lower  lobe, abutting the left ventricle and containing a biopsy clip, measuring 2.3 x 1.9 cm, previously 2.1 x 1.6 cm  (series 4, image 116). This appears to have gradually enlarged over sequential prior examinations, measuring 1.8 x 1.5 cm on examination dated 07/09/2020. Stable bandlike scarring and subpleural nodular opacity of the peripheral left lung base (series 4, image 143). No pleural effusion or pneumothorax. Upper Abdomen: No acute abnormality. Musculoskeletal: No chest wall mass or suspicious bone lesions identified. IMPRESSION: 1. Slight interval enlargement and increased fullness of a spiculated mass of the medial infrahilar left lower lobe, abutting the left ventricle and containing a biopsy clip, measuring 2.3 x 1.9 cm, previously 2.1 x 1.6 cm. This appears to have gradually enlarged over sequential prior examinations, measuring 1.8 x 1.5 cm on examination dated 07/09/2020. Findings are concerning for residual or recurrent malignancy. This could be evaluated for metabolic activity by PET-CT if desired. 2. Stable bandlike scarring and subpleural nodular opacity of the peripheral left lung base, most likely post infectious or inflammatory. Continued attention on follow-up. 3. Emphysema and diffuse bilateral bronchial wall thickening. 4. Coronary artery disease. Aortic Atherosclerosis (ICD10-I70.0) and Emphysema (ICD10-J43.9). Electronically Signed   By: Delanna Ahmadi M.D.   On: 03/19/2021 14:29     ASSESSMENT:  1.  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 -Patient placed on O2 nasal cannula in the last 3 weeks for drop in sats on exertion. -PET scan on 01/28/2020 shows 2.6 cm left lower lobe nodule SUV 10.1.  Focal hypermetabolic them in the left hilum identified without discrete lymph node visible on CT.  10 mm short axis precarinal lymph node with low-level SUV 2.9.  7 mm short axis right hilar node with SUV 3.8. -Navigational bronchoscopy and biopsy of the left lower lobe lung lesion consistent with squamous cell  carcinoma. -IMRT to the left lower lung mass from 04/14/2020 through 04/24/2020, 50 Gy/ 5 fractions.   2.  Social/family history: -2 pack/day smoker for 68 years.  Currently smokes 4 cigarettes/day. 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 -Father had throat cancer.   PLAN:  1.  Clinical T1c N0 M0 squamous cell carcinoma of left lung lower lobe: - Denies any changes in his cough or hemoptysis. - Baseline shortness of breath on exertion is stable. - Reviewed images of the CT scan of the chest with contrast from 03/18/2021 with the patient and his son.  Left lower lobe medial infrahilar spiculated mass abutting the left ventricle measures 2.3 x 1.9 cm, previously 2.1 x 1.6 cm in July and 1.8 x 1.5 cm on 07/09/2020. - Based on these findings, I have recommended PET CT scan and follow-up. - Patient will follow up with Dr. Melvyn Novas for COPD management.   Orders placed this encounter:  No orders of the defined types were placed in this encounter.    Derek Jack, MD [place] [phone]   I, Thana Ates, am acting as a scribe for Dr. Derek Jack.  I, Derek Jack MD, have reviewed the above documentation for accuracy and completeness, and I agree with the above.

## 2021-03-30 ENCOUNTER — Other Ambulatory Visit: Payer: Self-pay

## 2021-03-30 ENCOUNTER — Inpatient Hospital Stay (HOSPITAL_BASED_OUTPATIENT_CLINIC_OR_DEPARTMENT_OTHER): Payer: Medicare HMO | Admitting: Hematology

## 2021-03-30 VITALS — BP 144/76 | HR 75 | Temp 96.8°F | Resp 17 | Wt 137.7 lb

## 2021-03-30 DIAGNOSIS — R634 Abnormal weight loss: Secondary | ICD-10-CM | POA: Insufficient documentation

## 2021-03-30 DIAGNOSIS — F17210 Nicotine dependence, cigarettes, uncomplicated: Secondary | ICD-10-CM | POA: Insufficient documentation

## 2021-03-30 DIAGNOSIS — Z923 Personal history of irradiation: Secondary | ICD-10-CM | POA: Diagnosis not present

## 2021-03-30 DIAGNOSIS — C3492 Malignant neoplasm of unspecified part of left bronchus or lung: Secondary | ICD-10-CM | POA: Diagnosis not present

## 2021-03-30 DIAGNOSIS — Z79899 Other long term (current) drug therapy: Secondary | ICD-10-CM | POA: Insufficient documentation

## 2021-03-30 DIAGNOSIS — Z7982 Long term (current) use of aspirin: Secondary | ICD-10-CM | POA: Insufficient documentation

## 2021-03-30 DIAGNOSIS — R69 Illness, unspecified: Secondary | ICD-10-CM | POA: Diagnosis not present

## 2021-03-30 DIAGNOSIS — C3432 Malignant neoplasm of lower lobe, left bronchus or lung: Secondary | ICD-10-CM | POA: Diagnosis not present

## 2021-03-30 DIAGNOSIS — Z9221 Personal history of antineoplastic chemotherapy: Secondary | ICD-10-CM | POA: Insufficient documentation

## 2021-03-30 NOTE — Patient Instructions (Signed)
[place] at [facility] Discharge Instructions  You were seen and examined today by Dr. Delton Coombes. He reviewed your most recent labs and CT scan. Your labs are looking good. The CT scan shows that the lesion in your lung has increased in size. Dr. Delton Coombes recommends doing a PET scan and based on those results he will discuss options.   Thank you for choosing [place] at [facility] to provide your oncology and hematology care.  To afford each patient quality time with our provider, please arrive at least 15 minutes before your scheduled appointment time.   If you have a lab appointment with the [facility] please come in thru the Main Entrance and check in at the main information desk.  You need to re-schedule your appointment should you arrive 10 or more minutes late.  We strive to give you quality time with our providers, and arriving late affects you and other patients whose appointments are after yours.  Also, if you no show three or more times for appointments you may be dismissed from the clinic at the providers discretion.     Again, thank you for choosing [facility].  Our hope is that these requests will decrease the amount of time that you wait before being seen by our physicians.       _____________________________________________________________  Should you have questions after your visit to [facility], please contact our office at [phone] and follow the prompts.  Our office hours are 8:00 a.m. and 4:30 p.m. Monday - Friday.  Please note that voicemails left after 4:00 p.m. may not be returned until the following business day.  We are closed weekends and major holidays.  You do have access to a nurse 24-7, just call the main number to the clinic [phone] and do not press any options, hold on the line and a nurse will answer the phone.    For prescription refill requests, have your pharmacy  contact our office and allow 72 hours.    Due to Covid, you will need to wear a mask upon entering the hospital. If you do not have a mask, a mask will be given to you at the Main Entrance upon arrival. For doctor visits, patients may have 1 support person age 64 or older with them. For treatment visits, patients can not have anyone with them due to social distancing guidelines and our immunocompromised population.

## 2021-04-03 ENCOUNTER — Ambulatory Visit: Payer: Medicare HMO | Admitting: Cardiology

## 2021-04-05 NOTE — Progress Notes (Signed)
Cardiology Office Note  Date: 04/06/2021   ID: [name] Graham, DOB 1935/09/12, MRN [id number]  PCP:  [name] Penner, FNP  Cardiologist:  [name] Lesches, MD Electrophysiologist:  None   Chief Complaint  Patient presents with   Cardiac follow-up     History of Present Illness: [name] Graham is a 85 y.o. male last seen in May.  He is here for a follow-up visit.  He does not report any progressive angina symptoms or nitroglycerin use since last encounter.  He is following with Dr. Delton Graham for treatment of left lung squamous cell carcinoma.  He is status post XRT.  Also continues to follow with Dr. Melvyn Graham for treatment of COPD requiring intermittent supplemental oxygen use.  I reviewed his cardiac medications which are stable and outlined below.  Echocardiogram from July revealed LVEF 40 to 45% with moderate LVH and mild diastolic dysfunction, mild mitral regurgitation, mildly dilated aortic root at 40 mm.  Past Medical History:  Diagnosis Date   Arthritis    COPD (chronic obstructive pulmonary disease) ([place])    Coronary atherosclerosis of native coronary artery    BMS circ and PTCA PDA 2007, DES RCA 10/09, DES LAD 2/12   Essential hypertension    GERD (gastroesophageal reflux disease)    Hearing loss    Heart attack ([place])    x 3 (latest 2009)   History of kidney stones    Hyperlipidemia    Ischemic cardiomyopathy    NSTEMI (non-ST elevated myocardial infarction) ([place])    2007   Squamous cell lung cancer [name])     Past Surgical History:  Procedure Laterality Date   ANGIOPLASTY     stent   BRONCHIAL BIOPSY  02/19/2020   Procedure: BRONCHIAL BIOPSIES;  Surgeon: Collene Gobble, MD;  Location: [place];  Service: Pulmonary;;   BRONCHIAL BRUSHINGS  02/19/2020   Procedure: BRONCHIAL BRUSHINGS;  Surgeon: Collene Gobble, MD;  Location: [facility] ENDOSCOPY;  Service: Pulmonary;;   BRONCHIAL NEEDLE ASPIRATION BIOPSY  02/19/2020   Procedure: BRONCHIAL NEEDLE  ASPIRATION BIOPSIES;  Surgeon: Collene Gobble, MD;  Location: [facility] ENDOSCOPY;  Service: Pulmonary;;   COLONOSCOPY     FIDUCIAL MARKER PLACEMENT  02/19/2020   Procedure: FIDUCIAL MARKER PLACEMENT;  Surgeon: Collene Gobble, MD;  Location: [facility] ENDOSCOPY;  Service: Pulmonary;;   HERNIA REPAIR     LIPOMA EXCISION Right    hip/buttox   TONSILLECTOMY     as a child   VIDEO BRONCHOSCOPY WITH ENDOBRONCHIAL NAVIGATION N/A 02/19/2020   Procedure: VIDEO BRONCHOSCOPY WITH ENDOBRONCHIAL NAVIGATION;  Surgeon: Collene Gobble, MD;  Location: [facility] ENDOSCOPY;  Service: Pulmonary;  Laterality: N/A;    Current Outpatient Medications  Medication Sig Dispense Refill   albuterol (VENTOLIN HFA) 108 (90 Base) MCG/ACT inhaler Inhale into the lungs every 6 (six) hours as needed for wheezing or shortness of breath.     amLODipine (NORVASC) 5 MG tablet Take 1 tablet (5 mg total) by mouth daily. 90 tablet 3   aspirin EC 81 MG tablet Take 1 tablet (81 mg total) by mouth daily. 90 tablet 3   clopidogrel (PLAVIX) 75 MG tablet Take 1 tablet (75 mg total) by mouth daily. 90 tablet 1   cyclobenzaprine (FLEXERIL) 5 MG tablet Take 1 tablet (5 mg total) by mouth 3 (three) times daily as needed for muscle spasms. 90 tablet 0   Ensure (ENSURE) Take 237 mLs by mouth 2 (two) times daily.     fluticasone (FLONASE) 50  MCG/ACT nasal spray Place into both nostrils 3 (three) times daily as needed for allergies or rhinitis.     HYDROcodone-acetaminophen (NORCO) 7.5-325 MG tablet Take 1 tablet by mouth daily as needed.     metoprolol succinate (TOPROL XL) 25 MG 24 hr tablet Take 1 tablet (25 mg total) by mouth daily. 90 tablet 3   Naphazoline HCl (CLEAR EYES OP) Place 1 drop into both eyes daily as needed (irritation / redness).     nitroGLYCERIN (NITROSTAT) 0.4 MG SL tablet DISSOLVE ONE TABLET UNDER THE TONGUE EVERY 5 MINUTES AS NEEDED FOR CHEST PAIN.  DO NOT EXCEED A TOTAL OF 3 DOSES IN 15 MINUTES 25 tablet 3   olmesartan (BENICAR) 40 MG  tablet Take 1 tablet (40 mg total) by mouth daily. 30 tablet 11   OXYGEN Inhale into the lungs.     simvastatin (ZOCOR) 80 MG tablet Take 1 tablet (80 mg total) by mouth at bedtime. 90 tablet 1   Tiotropium Bromide Monohydrate (SPIRIVA RESPIMAT) 2.5 MCG/ACT AERS Inhale 2 puffs into the lungs daily. 4 g 11   umeclidinium bromide (INCRUSE ELLIPTA) 62.5 MCG/INH AEPB Inhale 1 puff into the lungs daily. 30 each 11   No current facility-administered medications for this visit.   Allergies:  Entresto [sacubitril-valsartan]   ROS: Chronic hearing loss.  Physical Exam: VS:  BP (!) 144/82   Pulse 72   Ht 5\' 10"  (1.778 m)   Wt 140 lb 6.4 oz (63.7 kg)   SpO2 95%   BMI 20.15 kg/m , BMI Body mass index is 20.15 kg/m.  Wt Readings from Last 3 Encounters:  04/06/21 140 lb 6.4 oz (63.7 kg)  03/30/21 137 lb 11.2 oz (62.5 kg)  11/19/20 136 lb 9.6 oz (62 kg)    General: Elderly male, no distress. HEENT: Conjunctiva and lids normal, wearing a mask. Neck: Supple, no elevated JVP or carotid bruits, no thyromegaly. Lungs: Bronchial breath sounds without wheezing, nonlabored breathing at rest. Cardiac: Regular rate and rhythm, no S3, 1/6 systolic murmur. Extremities: No pitting edema.  ECG:  An ECG dated 09/01/2020 was personally reviewed today and demonstrated:  Sinus rhythm with PAC, right bundle branch block and left anterior fascicular block.  Recent Labwork: 03/18/2021: ALT 12; AST 18; BUN 22; Creatinine, Ser 1.02; Hemoglobin 15.5; Platelets 209; Potassium 4.1; Sodium 136   Other Studies Reviewed Today:  Cardiac monitor May 2022: ZIO XT reviewed.  14 days analyzed.  Predominant rhythm is sinus with heart rate ranging from 52 bpm up to 132 bpm and average heart rate 68 bpm.  There were rare PACs including couplets and triplets representing less than 1% total beats.  Relatively frequent PVCs were noted representing 5.2% total beats with rare couplets and triplets as well as ventricular bigeminy and  trigeminy.  There were 5 brief episodes of NSVT, no longer than 5 beats observed.  No sustained ventricular events.  Several episodes of PSVT were noted, the longest of which lasted approximately 15 seconds with average heart rate of 112.  There were no pauses.  Echocardiogram 10/31/2020:  1. Left ventricular ejection fraction, by estimation, is 40 to 45%. The  left ventricle has mildly decreased function. The left ventricle  demonstrates regional wall motion abnormalities (see scoring  diagram/findings for description). There is moderate  left ventricular hypertrophy. Left ventricular diastolic parameters are  consistent with Grade I diastolic dysfunction (impaired relaxation).   2. Right ventricular systolic function is normal. The right ventricular  size is normal. Tricuspid regurgitation  signal is inadequate for assessing  PA pressure.   3. The mitral valve is grossly normal. Mild mitral valve regurgitation.   4. The aortic valve is tricuspid. Aortic valve regurgitation is not  visualized. Mild aortic valve sclerosis is present, with no evidence of  aortic valve stenosis.   5. Aortic dilatation noted. There is mild dilatation of the aortic root,  measuring 40 mm.   6. The inferior vena cava is normal in size with greater than 50%  respiratory variability, suggesting right atrial pressure of 3 mmHg.   Assessment and Plan:  1.  Multivessel CAD status post PCI, most recent intervention was DES to the LAD in 2012.  We have managed him medically in the absence of accelerating angina.  He is currently on aspirin, Plavix, Benicar, Norvasc, Toprol-XL, and Zocor.  No increase in use of nitroglycerin.  2.  Squamous cell carcinoma of the left lung currently following with Dr. Delton Graham.  3.  COPD with history of tobacco abuse and chronic hypoxic respiratory failure currently following with Dr. Melvyn Graham.  4.  History of frequent PVCs, no progression in symptoms on Toprol-XL.  5.  Ischemic  cardiomyopathy.  He has prior intolerance to [facility].  Has declined ICD which is reasonable at this point.  Continue current regimen including Toprol-XL and Benicar.  Not requiring diuretic.  Medication Adjustments/Labs and Tests Ordered: Current medicines are reviewed at length with the patient today.  Concerns regarding medicines are outlined above.   Tests Ordered: No orders of the defined types were placed in this encounter.   Medication Changes: No orders of the defined types were placed in this encounter.   Disposition:  Follow up  6 months.  Signed, Satira Sark, MD, [facility] 04/06/2021 10:30 AM    [place] [facility] [place], [place], [place] [address] Phone: [phone]; Fax: [phone]

## 2021-04-06 ENCOUNTER — Encounter: Payer: Self-pay | Admitting: Cardiology

## 2021-04-06 ENCOUNTER — Ambulatory Visit (INDEPENDENT_AMBULATORY_CARE_PROVIDER_SITE_OTHER): Payer: Medicare HMO | Admitting: Cardiology

## 2021-04-06 ENCOUNTER — Other Ambulatory Visit: Payer: Self-pay

## 2021-04-06 VITALS — BP 144/82 | HR 72 | Ht 70.0 in | Wt 140.4 lb

## 2021-04-06 DIAGNOSIS — I255 Ischemic cardiomyopathy: Secondary | ICD-10-CM | POA: Diagnosis not present

## 2021-04-06 DIAGNOSIS — I25119 Atherosclerotic heart disease of native coronary artery with unspecified angina pectoris: Secondary | ICD-10-CM

## 2021-04-06 DIAGNOSIS — I471 Supraventricular tachycardia: Secondary | ICD-10-CM

## 2021-04-06 DIAGNOSIS — I2510 Atherosclerotic heart disease of native coronary artery without angina pectoris: Secondary | ICD-10-CM | POA: Diagnosis not present

## 2021-04-06 DIAGNOSIS — J449 Chronic obstructive pulmonary disease, unspecified: Secondary | ICD-10-CM | POA: Diagnosis not present

## 2021-04-06 NOTE — Patient Instructions (Addendum)

## 2021-04-09 ENCOUNTER — Encounter (HOSPITAL_COMMUNITY)
Admission: RE | Admit: 2021-04-09 | Discharge: 2021-04-09 | Disposition: A | Discharge status: Home / Self Care | Payer: Medicare HMO | Source: Ambulatory Visit | Attending: Hematology | Admitting: Hematology

## 2021-04-09 ENCOUNTER — Other Ambulatory Visit: Payer: Self-pay

## 2021-04-09 ENCOUNTER — Other Ambulatory Visit: Payer: Self-pay | Admitting: Internal Medicine

## 2021-04-09 DIAGNOSIS — C3492 Malignant neoplasm of unspecified part of left bronchus or lung: Secondary | ICD-10-CM | POA: Diagnosis not present

## 2021-04-09 DIAGNOSIS — I10 Essential (primary) hypertension: Secondary | ICD-10-CM

## 2021-04-09 DIAGNOSIS — I77811 Abdominal aortic ectasia: Secondary | ICD-10-CM | POA: Diagnosis not present

## 2021-04-09 DIAGNOSIS — I2510 Atherosclerotic heart disease of native coronary artery without angina pectoris: Secondary | ICD-10-CM | POA: Diagnosis not present

## 2021-04-09 DIAGNOSIS — J329 Chronic sinusitis, unspecified: Secondary | ICD-10-CM | POA: Diagnosis not present

## 2021-04-09 DIAGNOSIS — C3490 Malignant neoplasm of unspecified part of unspecified bronchus or lung: Secondary | ICD-10-CM | POA: Diagnosis not present

## 2021-04-09 MED ORDER — FLUDEOXYGLUCOSE F - 18 (FDG) INJECTION
7.4610 | Freq: Once | INTRAVENOUS | Status: AC | PRN
  Administered 2021-04-09: 7.461 via INTRAVENOUS

## 2021-04-10 ENCOUNTER — Telehealth: Payer: Self-pay | Admitting: Internal Medicine

## 2021-04-10 ENCOUNTER — Other Ambulatory Visit: Payer: Self-pay | Admitting: Pulmonary Disease

## 2021-04-10 DIAGNOSIS — I10 Essential (primary) hypertension: Secondary | ICD-10-CM

## 2021-04-10 MED ORDER — OLMESARTAN MEDOXOMIL 40 MG PO TABS: 40.0000 mg | ORAL_TABLET | Freq: Every day | ORAL | 11 refills | Status: DC

## 2021-04-10 NOTE — Progress Notes (Unsigned)
Medication refilled for patient. 

## 2021-04-10 NOTE — Telephone Encounter (Signed)
Called pt and there was no answer-LMTCB °

## 2021-04-13 ENCOUNTER — Telehealth: Payer: Self-pay | Admitting: Adult Health

## 2021-04-13 ENCOUNTER — Other Ambulatory Visit: Payer: Self-pay | Admitting: Internal Medicine

## 2021-04-13 DIAGNOSIS — I10 Essential (primary) hypertension: Secondary | ICD-10-CM

## 2021-04-13 NOTE — Telephone Encounter (Signed)
Called-Benicar rx has not been filled.  Rx was sent earlier today  Advised patient to check with pharmacy again to see if they have filled  Triage -please check with patient regarding rx to see pharmacy filled  Advised needs ov wit Dr. Melvyn Novas  , last seen 1 yr ago .  Needs to f/up with PCP for b/p meds/follow up

## 2021-04-13 NOTE — Telephone Encounter (Signed)
I called the daughter back and let her know that Dr. Tamala Julian had sent in the prescription to [facility] for the patient. Nothing further needed.

## 2021-04-14 MED ORDER — OLMESARTAN MEDOXOMIL 40 MG PO TABS: 40.0000 mg | ORAL_TABLET | Freq: Every day | ORAL | 1 refills | Status: DC

## 2021-04-14 NOTE — Telephone Encounter (Signed)
[name] Graham wife pharmacy states Benicar not authorized by our office. Pharm[facility] [name] Graham phone number is [phone].

## 2021-04-14 NOTE — Telephone Encounter (Signed)
I received a call from the pharmacy that a prescription was not being received and I have sent a short term supply to the pharmacy and he would need an OV to get further refills.

## 2021-04-14 NOTE — Progress Notes (Signed)
[name] Graham, [place] [address]   CLINIC:  Medical Oncology/Hematology  PCP:  Lysbeth Penner, [address] / [place] [address] [phone]   REASON FOR VISIT:  Follow-up for left squamous cell lung cancer  PRIOR THERAPY: Left lung SBRT 50 Gy in 5 fractions from 04/14/2020 to 04/24/2020  NGS Results: not done  CURRENT THERAPY: surveillance  BRIEF ONCOLOGIC HISTORY:  Oncology History  Squamous cell lung cancer, left ([name] Graham)  12/08/2016 Initial Diagnosis   Squamous cell lung cancer, left ([name] Graham)   11/12/2020 Imaging   1. LEFT infrahilar lesion of similar size compared to most recent study with evolving post treatment changes and diminished basilar nodularity that was seen previously. 2. Basilar nodularity discussed above likely post inflammatory or infectious, attention on follow-up. No new or progressive findings. 3. Diminishing size of mediastinal lymph nodes and particularly the dominant node anterior to the carina. 4. Three-vessel coronary artery disease. 5. Emphysema and aortic atherosclerosis.       CANCER STAGING: Cancer Staging  No matching staging information was found for the patient.  INTERVAL HISTORY:  [name], a 85 y.o. male, returns for routine follow-up of his left squamous cell lung cancer. Treon was last seen on 03/30/2021.  Today he reports feeling good.  He reports fatigue and SOB.  REVIEW OF SYSTEMS:  Review of Systems  Constitutional:  Positive for fatigue (50%). Negative for appetite change.  Respiratory:  Positive for cough and shortness of breath.   Musculoskeletal:  Positive for back pain (3/10).  Neurological:  Positive for headaches.  All other systems reviewed and are negative.  PAST MEDICAL/SURGICAL HISTORY:  Past Medical History:  Diagnosis Date   Arthritis    COPD (chronic obstructive pulmonary disease) ([place])    Coronary atherosclerosis of native coronary artery    BMS  circ and PTCA PDA 2007, DES RCA 10/09, DES LAD 2/12   Essential hypertension    GERD (gastroesophageal reflux disease)    Hearing loss    Heart attack ([name] Graham)    x 3 (latest 2009)   History of kidney stones    Hyperlipidemia    Ischemic cardiomyopathy    NSTEMI (non-ST elevated myocardial infarction) ([name] Graham)    2007   Squamous cell lung cancer [facility])    Past Surgical History:  Procedure Laterality Date   ANGIOPLASTY     stent   BRONCHIAL BIOPSY  02/19/2020   Procedure: BRONCHIAL BIOPSIES;  Surgeon: Collene Gobble, MD;  Location: [place];  Service: Pulmonary;;   BRONCHIAL BRUSHINGS  02/19/2020   Procedure: BRONCHIAL BRUSHINGS;  Surgeon: Collene Gobble, MD;  Location[facility] END[facility]COPY;  Service: Pulmonary;;   BRONCHIAL NEEDLE ASPIRATION BIOPSY  02/19/2020   Procedure: BRONCHIAL NEEDLE ASPIRATION BIOPSIES;  Surgeon: Collene Gobble, MD;  Location: [facility] ENDOSCOPY;  Service: Pulmonary;;   COLONOSCOPY     FIDUCIAL MARKER PLACEMENT  02/19/2020   Procedure: FIDUCIAL MARKER PLACEMENT;  Surgeon: Collene Gobble, MD;  Location: [facility] ENDOSCOPY;  Service: Pulmonary;;   HERNIA REPAIR     LIPOMA EXCISION Right    hip/buttox   TONSILLECTOMY     as a child   VIDEO BRONCHOSCOPY WITH ENDOBRONCHIAL NAVIGATION N/A 02/19/2020   Procedure: VIDEO BRONCHOSCOPY WITH ENDOBRONCHIAL NAVIGATION;  Surgeon: Collene Gobble, MD;  Location: [facility] ENDOSCOPY;  Service: Pulmonary;  Laterality: N/A;    SOCIAL HISTORY:  Social History   Socioeconomic History   Marital status: Divorced  Spouse name: Not on file   Number of children: Not on file   Years of education: Not on file   Highest education level: Not on file  Occupational History   Occupation: Retired  Tobacco Use   Smoking status: Every Day    Packs/day: 0.50    Years: 63.00    Pack years: 31.50    Types: Cigarettes    Start date: 06/15/1951   Smokeless tobacco: Never   Tobacco comments:    4 cigarettes smoked daily 03/05/20 ARJ   Vaping Use    Vaping Use: Never used  Substance and Sexual Activity   Alcohol use: No    Alcohol/week: 0.0 standard drinks   Drug use: No   Sexual activity: Not Currently  Other Topics Concern   Not on file  Social History Narrative   Not on file   Social Determinants of Health   Financial Resource Strain: Not on file  Food Insecurity: Not on file  Transportation Needs: Not on file  Physical Activity: Not on file  Stress: Not on file  Social Connections: Not on file  Intimate Partner Violence: Not on file    FAMILY HISTORY:  Family History  Problem Relation Age of Onset   Throat cancer Father    Heart attack Mother    Alzheimer's disease Mother     CURRENT MEDICATIONS:  Current Outpatient Medications  Medication Sig Dispense Refill   albuterol (VENTOLIN HFA) 108 (90 Base) MCG/ACT inhaler Inhale into the lungs every 6 (six) hours as needed for wheezing or shortness of breath.     amLODipine (NORVASC) 5 MG tablet Take 1 tablet (5 mg total) by mouth daily. 90 tablet 3   aspirin EC 81 MG tablet Take 1 tablet (81 mg total) by mouth daily. 90 tablet 3   clopidogrel (PLAVIX) 75 MG tablet Take 1 tablet (75 mg total) by mouth daily. 90 tablet 1   cyclobenzaprine (FLEXERIL) 5 MG tablet Take 1 tablet (5 mg total) by mouth 3 (three) times daily as needed for muscle spasms. 90 tablet 0   Ensure (ENSURE) Take 237 mLs by mouth 2 (two) times daily.     fluticasone (FLONASE) 50 MCG/ACT nasal spray Place into both nostrils 3 (three) times daily as needed for allergies or rhinitis.     HYDROcodone-acetaminophen (NORCO) 7.5-325 MG tablet Take 1 tablet by mouth daily as needed.     metoprolol succinate (TOPROL XL) 25 MG 24 hr tablet Take 1 tablet (25 mg total) by mouth daily. 90 tablet 3   Naphazoline HCl (CLEAR EYES OP) Place 1 drop into both eyes daily as needed (irritation / redness).     nitroGLYCERIN (NITROSTAT) 0.4 MG SL tablet DISSOLVE ONE TABLET UNDER THE TONGUE EVERY 5 MINUTES AS NEEDED FOR CHEST  PAIN.  DO NOT EXCEED A TOTAL OF 3 DOSES IN 15 MINUTES 25 tablet 3   olmesartan (BENICAR) 40 MG tablet Take 1 tablet (40 mg total) by mouth daily. 30 tablet 1   OXYGEN Inhale into the lungs.     simvastatin (ZOCOR) 80 MG tablet Take 1 tablet (80 mg total) by mouth at bedtime. 90 tablet 1   Tiotropium Bromide Monohydrate (SPIRIVA RESPIMAT) 2.5 MCG/ACT AERS Inhale 2 puffs into the lungs daily. 4 g 11   umeclidinium bromide (INCRUSE ELLIPTA) 62.5 MCG/INH AEPB Inhale 1 puff into the lungs daily. 30 each 11   No current facility-administered medications for this visit.    ALLERGIES:  Allergies  Allergen Reactions  Entresto [Sacubitril-Valsartan] Itching    PHYSICAL EXAM:  Performance status (ECOG): 2 - Symptomatic, <50% confined to bed  There were no vitals filed for this visit. Wt Readings from Last 3 Encounters:  04/06/21 140 lb 6.4 oz (63.7 kg)  03/30/21 137 lb 11.2 oz (62.5 kg)  11/19/20 136 lb 9.6 oz (62 kg)   Physical Exam Vitals reviewed.  Constitutional:      Appearance: Normal appearance.  Cardiovascular:     Rate and Rhythm: Normal rate and regular rhythm.     Pulses: Normal pulses.     Heart sounds: Normal heart sounds.  Pulmonary:     Effort: Pulmonary effort is normal.     Breath sounds: Normal breath sounds.  Neurological:     General: No focal deficit present.     Mental Status: He is alert and oriented to person, place, and time.  Psychiatric:        Mood and Affect: Mood normal.        Behavior: Behavior normal.     LABORATORY DATA:  I have reviewed the labs as listed.  CBC Latest Ref Rng & Units 03/18/2021 11/12/2020 07/09/2020  WBC 4.0 - 10.5 K/uL 6.8 7.3 5.4  Hemoglobin 13.0 - 17.0 g/dL 15.5 16.4 15.5  Hematocrit 39.0 - 52.0 % 45.7 48.1 46.1  Platelets 150 - 400 K/uL 209 188 196   CMP Latest Ref Rng & Units 03/18/2021 11/12/2020 07/09/2020  Glucose 70 - 99 mg/dL 97 103(H) 122(H)  BUN 8 - 23 mg/dL 22 15 20   Creatinine 0.61 - 1.24 mg/dL 1.02 1.08 1.04   Sodium 135 - 145 mmol/L 136 135 138  Potassium 3.5 - 5.1 mmol/L 4.1 4.5 4.2  Chloride 98 - 111 mmol/L 106 102 106  CO2 22 - 32 mmol/L 23 25 24   Calcium 8.9 - 10.3 mg/dL 8.9 9.2 9.0  Total Protein 6.5 - 8.1 g/dL 7.4 7.5 7.1  Total Bilirubin 0.3 - 1.2 mg/dL 0.6 0.9 0.7  Alkaline Phos 38 - 126 U/L 56 53 51  AST 15 - 41 U/L 18 19 19   ALT 0 - 44 U/L 12 13 14     DIAGNOSTIC IMAGING:  I have independently reviewed the scans and discussed with the patient. CT CHEST W CONTRAST  Result Date: 03/19/2021 CLINICAL DATA:  Left lower lobe squamous cell lung cancer, status post radiation therapy. EXAM: CT CHEST WITH CONTRAST TECHNIQUE: Multidetector CT imaging of the chest was performed during intravenous contrast administration. CONTRAST:  21mL OMNIPAQUE IOHEXOL 300 MG/ML  SOLN COMPARISON:  11/12/2020 FINDINGS: Cardiovascular: Mixed aortic atherosclerosis. Normal heart size. Three-vessel coronary artery calcifications and/or stents. No pericardial effusion. Mediastinum/Nodes: Unchanged prominent mediastinal and left hilar lymph nodes, largest pretracheal node measuring 2.0 x 1.2 cm (series 2, image 72). Thyroid gland, trachea, and esophagus demonstrate no significant findings. Lungs/Pleura: Moderate to severe centrilobular emphysema. Diffuse bilateral bronchial wall thickening. Biapical pleuroparenchymal scarring. Slight interval enlargement and increased fullness of a spiculated mass of the medial infrahilar left lower lobe, abutting the left ventricle and containing a biopsy clip, measuring 2.3 x 1.9 cm, previously 2.1 x 1.6 cm (series 4, image 116). This appears to have gradually enlarged over sequential prior examinations, measuring 1.8 x 1.5 cm on examination dated 07/09/2020. Stable bandlike scarring and subpleural nodular opacity of the peripheral left lung base (series 4, image 143). No pleural effusion or pneumothorax. Upper Abdomen: No acute abnormality. Musculoskeletal: No chest wall mass or suspicious  bone lesions identified. IMPRESSION: 1. Slight interval enlargement and  increased fullness of a spiculated mass of the medial infrahilar left lower lobe, abutting the left ventricle and containing a biopsy clip, measuring 2.3 x 1.9 cm, previously 2.1 x 1.6 cm. This appears to have gradually enlarged over sequential prior examinations, measuring 1.8 x 1.5 cm on examination dated 07/09/2020. Findings are concerning for residual or recurrent malignancy. This could be evaluated for metabolic activity by PET-CT if desired. 2. Stable bandlike scarring and subpleural nodular opacity of the peripheral left lung base, most likely post infectious or inflammatory. Continued attention on follow-up. 3. Emphysema and diffuse bilateral bronchial wall thickening. 4. Coronary artery disease. Aortic Atherosclerosis (ICD10-I70.0) and Emphysema (ICD10-J43.9). Electronically Signed   By: Delanna Ahmadi M.D.   On: 03/19/2021 14:29   NM PET Image Restag (PS) Skull Base To Thigh  Result Date: 04/13/2021 CLINICAL DATA:  Subsequent treatment strategy for non-small-cell lung cancer. Restaging. Status post radiation therapy for left lower lobe primary. EXAM: NUCLEAR MEDICINE PET SKULL BASE TO THIGH TECHNIQUE: 7.5 mCi F-18 FDG was injected intravenously. Full-ring PET imaging was performed from the skull base to thigh after the radiotracer. CT data was obtained and used for attenuation correction and anatomic localization. Fasting blood glucose: 95 mg/dl COMPARISON:  Multiple chest CTs, most recent 03/18/2021. Most recent PET 01/28/2020 FINDINGS: Mediastinal blood pool activity: SUV max 1.8 Liver activity: SUV max NA NECK: No areas of abnormal hypermetabolism. Incidental CT findings: No cervical adenopathy. Dense carotid atherosclerosis bilaterally. Mucosal thickening of left maxillary sinus. CHEST: Biapical pleuroparenchymal scarring. Low-level hypermetabolism within, including at a S.U.V. max of 2.4 on the left. Again identified are areas  of hilar hypermetabolism, without well-defined adenopathy. Example on the right at a S.U.V. max of 3.2 and on the left at a S.U.V. max of 3.1. The left lower lobe primary measures 2.1 x 2.2 cm and a S.U.V. max of 5.7, including on 184/7. Incidental CT findings: Deferred to recent diagnostic CT. Centrilobular emphysema. Aortic and coronary artery calcification. ABDOMEN/PELVIS: No abdominopelvic nodal hypermetabolism. Left-sided prostatic hypermetabolism in the setting of focal calcifications. Example at a S.U.V. max of 4.1 on 184/3. Incidental CT findings: Infrarenal abdominal aortic dilatation at 3.2 cm. Normal adrenal glands. Renal vascular calcifications. Bladder wall thickening, suggesting a component of outlet obstruction. Fat containing left inguinal hernia. SKELETON: No abnormal marrow activity. Incidental CT findings: No acute osseous abnormality. IMPRESSION: 1. Focal hypermetabolism corresponding to the left lower lobe pulmonary nodule, consistent with residual/locally recurrent disease. 2. Low-level bilateral hilar hypermetabolism without adenopathy, similar to the prior PET and favored to be physiologic or reactive. 3. Left-sided prostatic hypermetabolism with concurrent calcifications. Possibly related to pancreatitis. Consider correlation with PSA level and possibly physical exam. 4. Incidental findings, including: Aortic atherosclerosis (ICD10-I70.0), coronary artery atherosclerosis and emphysema (ICD10-J43.9). Sinus disease. 5. Infrarenal aortic dilatation including at 3.2 cm. Recommend follow-up ultrasound every 3 years. This recommendation follows ACR consensus guidelines: White Paper of the ACR Incidental Findings Committee II on Vascular Findings. J Am Coll Radiol 2013; 10:789-794. Electronically Signed   By: Abigail Miyamoto M.D.   On: 04/13/2021 11:37     ASSESSMENT:  1.  Squamous cell carcinoma of left lung lower lobe: -40 pound weight loss in the last 1 year, down to 129 pounds, improved to 137  with Megace. -Patient placed on O2 nasal cannula in the last 3 weeks for drop in sats on exertion. -PET scan on 01/28/2020 shows 2.6 cm left lower lobe nodule SUV 10.1.  Focal hypermetabolic them in the left hilum identified without  discrete lymph node visible on CT.  10 mm short axis precarinal lymph node with low-level SUV 2.9.  7 mm short axis right hilar node with SUV 3.8. -Navigational bronchoscopy and biopsy of the left lower lobe lung lesion consistent with squamous cell carcinoma. -IMRT to the left lower lung mass from 04/14/2020 through 04/24/2020, 50 Gy/ 5 fractions.   2.  Social/family history: -2 pack/day smoker for 68 years.  Currently smokes 4 cigarettes/day. 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 -Father had throat cancer.   PLAN:  1.  Clinical T1c N0 M0 squamous cell carcinoma of left lung lower lobe: - CT scan of the chest with contrast from 03/18/2021 showed left lower lobe medial infrahilar spiculated mass abutting the left ventricle measures 2.3 x 1.9 cm, previously 2.1 x 1.6 cm in July 2022 and 1.8 x 1.5 cm in March 2022. - We reviewed PET scan images from 04/09/2021.  Left lower lobe lung mass measures 2.1 x 2.2 cm with SUV max 5.7.  Hilar hypermetabolism without well-defined adenopathy was again noted, similar to pretreatment PET scan from August 2021. - I have recommended consultation with Dr. Sondra Come to see if any further radiation treatment is feasible. - I have recommended follow-up in 4 months with repeat CT chest with contrast.  2.  Prostatic hypermetabolism: - PET scan on 04/09/2021 showed incidental left-sided prostatic hypermetabolism, possibly related to prostatitis. - Will consider checking PSA level at next visit.    Orders placed this encounter:  No orders of the defined types were placed in this encounter.    Derek Jack, MD [place] [phone]   I, Thana Ates, am  acting as a scribe for Dr. Derek Jack.  I, Derek Jack MD, have reviewed the above documentation for accuracy and completeness, and I agree with the above.

## 2021-04-15 ENCOUNTER — Inpatient Hospital Stay (HOSPITAL_COMMUNITY): Payer: Medicare HMO | Attending: Hematology | Admitting: Hematology

## 2021-04-15 ENCOUNTER — Inpatient Hospital Stay (HOSPITAL_COMMUNITY): Payer: Medicare HMO

## 2021-04-15 ENCOUNTER — Telehealth: Payer: Self-pay | Admitting: Radiation Oncology

## 2021-04-15 ENCOUNTER — Other Ambulatory Visit: Payer: Self-pay

## 2021-04-15 VITALS — BP 112/76 | HR 74 | Temp 97.8°F | Resp 18 | Wt 139.2 lb

## 2021-04-15 DIAGNOSIS — C3492 Malignant neoplasm of unspecified part of left bronchus or lung: Secondary | ICD-10-CM | POA: Diagnosis not present

## 2021-04-15 DIAGNOSIS — Z808 Family history of malignant neoplasm of other organs or systems: Secondary | ICD-10-CM | POA: Insufficient documentation

## 2021-04-15 DIAGNOSIS — C3432 Malignant neoplasm of lower lobe, left bronchus or lung: Secondary | ICD-10-CM | POA: Insufficient documentation

## 2021-04-15 DIAGNOSIS — Z23 Encounter for immunization: Secondary | ICD-10-CM | POA: Insufficient documentation

## 2021-04-15 DIAGNOSIS — F17210 Nicotine dependence, cigarettes, uncomplicated: Secondary | ICD-10-CM | POA: Insufficient documentation

## 2021-04-15 DIAGNOSIS — R634 Abnormal weight loss: Secondary | ICD-10-CM | POA: Diagnosis not present

## 2021-04-15 DIAGNOSIS — I10 Essential (primary) hypertension: Secondary | ICD-10-CM | POA: Diagnosis not present

## 2021-04-15 DIAGNOSIS — R69 Illness, unspecified: Secondary | ICD-10-CM | POA: Diagnosis not present

## 2021-04-15 MED ORDER — INFLUENZA VAC A&B SA ADJ QUAD 0.5 ML IM PRSY
0.5000 mL | PREFILLED_SYRINGE | Freq: Once | INTRAMUSCULAR | Status: AC
  Administered 2021-04-15: 11:00:00 0.5 mL via INTRAMUSCULAR
  Filled 2021-04-15: qty 0.5

## 2021-04-15 NOTE — Patient Instructions (Signed)
[place]  Discharge Instructions: Thank you for choosing [facility] to provide your oncology and hematology care.  If you have a lab appointment with the [facility], please come in thru the Main Entrance and check in at the main information desk.  Wear comfortable clothing and clothing appropriate for easy access to any Portacath or PICC line.   We strive to give you quality time with your provider. You may need to reschedule your appointment if you arrive late (15 or more minutes).  Arriving late affects you and other patients whose appointments are after yours.  Also, if you miss three or more appointments without notifying the office, you may be dismissed from the clinic at the providers discretion.      For prescription refill requests, have your pharmacy contact our office and allow 72 hours for refills to be completed.    Today you received the Influenza vaccine.   To help prevent nausea and vomiting after your treatment, we encourage you to take your nausea medication as directed.  BELOW ARE SYMPTOMS THAT SHOULD BE REPORTED IMMEDIATELY: *FEVER GREATER THAN 100.4 F (38 C) OR HIGHER *CHILLS OR SWEATING *NAUSEA AND VOMITING THAT IS NOT CONTROLLED WITH YOUR NAUSEA MEDICATION *UNUSUAL SHORTNESS OF BREATH *UNUSUAL BRUISING OR BLEEDING *URINARY PROBLEMS (pain or burning when urinating, or frequent urination) *BOWEL PROBLEMS (unusual diarrhea, constipation, pain near the anus) TENDERNESS IN MOUTH AND THROAT WITH OR WITHOUT PRESENCE OF ULCERS (sore throat, sores in mouth, or a toothache) UNUSUAL RASH, SWELLING OR PAIN  UNUSUAL VAGINAL DISCHARGE OR ITCHING   Items with * indicate a potential emergency and should be followed up as soon as possible or go to the Emergency Department if any problems should occur.  Please show the CHEMOTHERAPY ALERT CARD or IMMUNOTHERAPY ALERT CARD at check-in to the Emergency Department and triage nurse.  Should you have  questions after your visit or need to cancel or reschedule your appointment, please contact [facility] [phone]  and follow the prompts.  Office hours are 8:00 a.m. to 4:30 p.m. Monday - Friday. Please note that voicemails left after 4:00 p.m. may not be returned until the following business day.  We are closed weekends and major holidays. You have access to a nurse at all times for urgent questions. Please call the main number to the clinic [phone] and follow the prompts.  For any non-urgent questions, you may also contact your provider using MyChart. We now offer e-Visits for anyone 83 and older to request care online for non-urgent symptoms. For details visit [email or web address].   Also download the MyChart app! Go to the app store, search "MyChart", open the app, select [place], and log in with your MyChart username and password.  Due to Covid, a mask is required upon entering the hospital/clinic. If you do not have a mask, one will be given to you upon arrival. For doctor visits, patients may have 1 support person aged 83 or older with them. For treatment visits, patients cannot have anyone with them due to current Covid guidelines and our immunocompromised population.

## 2021-04-15 NOTE — Progress Notes (Signed)
[name] Graham presents today for injection per the provider's orders.  Fluad administration without incident; injection site WNL; see MAR for injection details.  Patient tolerated procedure well and without incident.  No questions or complaints noted at this time.

## 2021-04-15 NOTE — Telephone Encounter (Signed)
Called pt to sch a consultation w. Dr. Sondra Come. No answer, LVM for a return call.

## 2021-04-15 NOTE — Patient Instructions (Signed)
[place] at [facility] Discharge Instructions   You were seen and examined today by Dr. Delton Coombes. He reviewed the results of your PET scan. We will refer you back to Dr. Earney Hamburg to see if radiation can be used to treat your lung nodule Return as scheduled in 4 months - we will repeat a CT of your chest prior to your next visit.    Thank you for choosing [place] at [facility] to provide your oncology and hematology care.  To afford each patient quality time with our provider, please arrive at least 15 minutes before your scheduled appointment time.   If you have a lab appointment with the [facility] please come in thru the Main Entrance and check in at the main information desk.  You need to re-schedule your appointment should you arrive 10 or more minutes late.  We strive to give you quality time with our providers, and arriving late affects you and other patients whose appointments are after yours.  Also, if you no show three or more times for appointments you may be dismissed from the clinic at the providers discretion.     Again, thank you for choosing [facility].  Our hope is that these requests will decrease the amount of time that you wait before being seen by our physicians.       _____________________________________________________________  Should you have questions after your visit to [facility], please contact our office at [phone] and follow the prompts.  Our office hours are 8:00 a.m. and 4:30 p.m. Monday - Friday.  Please note that voicemails left after 4:00 p.m. may not be returned until the following business day.  We are closed weekends and major holidays.  You do have access to a nurse 24-7, just call the main number to the clinic [phone] and do not press any options, hold on the line and a nurse will answer the phone.    For prescription refill requests, have your pharmacy contact  our office and allow 72 hours.    Due to Covid, you will need to wear a mask upon entering the hospital. If you do not have a mask, a mask will be given to you at the Main Entrance upon arrival. For doctor visits, patients may have 1 support person age 2 or older with them. For treatment visits, patients can not have anyone with them due to social distancing guidelines and our immunocompromised population.

## 2021-04-16 ENCOUNTER — Telehealth: Payer: Self-pay | Admitting: Radiation Oncology

## 2021-04-16 ENCOUNTER — Other Ambulatory Visit (HOSPITAL_COMMUNITY): Payer: Self-pay

## 2021-04-16 DIAGNOSIS — C3492 Malignant neoplasm of unspecified part of left bronchus or lung: Secondary | ICD-10-CM

## 2021-04-16 NOTE — Progress Notes (Signed)
[facility] can you please order PSA on this guy at next visit in 4 months.  Thanks  Order for PSA placed per Dr. Tomie China request.

## 2021-04-16 NOTE — Telephone Encounter (Signed)
Called pt to schedule a consultation w. Dr. Sondra Come. Pt stated he had bad reception, will return call to sch appt.

## 2021-04-29 NOTE — Progress Notes (Deleted)
error 

## 2021-05-05 NOTE — Progress Notes (Incomplete)
Radiation Oncology         [phone] ________________________________  Outpatient Re-Consultation  Name: [name] MRN: [id number]  Date: 05/06/2021  DOB: 1936-03-06  CC:Lysbeth Penner, FNP  Derek Jack, MD   REFERRING PHYSICIAN: Derek Jack, MD  Diagnosis: There were no encounter diagnoses.  Clinical T1cN0M0 squamous cell carcinoma of the left lower lung  Interval Since Last Radiation: 1 year and 12 days  Radiation Treatment Dates: 04/14/2020 through 04/24/2020 Site Technique Total Dose (Gy) Dose per Fx (Gy) Completed Fx Beam Energies  Lung, Left: Lung_Lt IMRT 50/50 10 5/5 6XFFF    Narrative::[name] Graham is a 86 y.o. male who is accompanied by ***. he returns today for re-evaluation, courtesy of Dr. Delton Coombes, for an opinion concerning further radiation therapy as part of management for his diagnosed left lung cancer. I last saw the patient for follow up on 05/29/20 following completion of his IMRT treatment directed at the left lung. Since that time, the patient has been on surveillance with Dr. Delton Coombes, and has been doing well other than continued SOB.  Recently, the patient presented for a follow-up Chest CT on 03/18/21 which showed the left lower lobe medial infrahilar spiculated mass abutting the left ventricle to measure 2.3 x 1.9 cm, previously 2.1 x 1.6 cm in July 2022, and 1.8 x 1.5 cm in March 2022.  Given an evident, gradual increase in size of the LLL nodule, the patient met with Dr. Delton Coombes on 03/30/21 who recommended further evaluation via PET scan.  PET on 04/09/21 showed the left lower lobe mass to measure 2.1 x 2.2 cm, with an SUV max of 5.7. Hilar hypermetabolism without well-defined adenopathy was again noted, and appeared similar to pretreatment PET scan from August 2021.  The patient again followed up with Dr. Delton Coombes on 04/15/21 who referred the patient to me here today to determine if any further radiation treatment  is feasible in treatment of the LLL mass.   Other pertinent imaging since the patient was last seen is as follows:  --Chest CT on 07/09/20 showed: a significant interval decrease in size of the infrahilar mass of the left lower lobe, consistent with treatment response; stable subpleural consolidation or nodule of the peripheral left lung base measuring 1.3 x 0.9 cm; unchanged prominent pretracheal and bilateral hilar lymph nodes, (with the largest pretracheal nodes measuring up to 2.3 x 1.4 cm); and diffuse bilateral bronchial wall thickening and frothy debris in the dependent airways bilaterally. --Chest CT on 11/12/20 showed: the left infrahilar lesion of similar size compared to most recent study, with evolving post treatment changes and diminished basilar nodularity; a likely inflammatory or infectious basilar nodularity; diminished size of the mediastinal lymph nodes and particularly the dominant node anterior to the carina.    PAST MEDICAL HISTORY:  Past Medical History:  Diagnosis Date   Arthritis    COPD (chronic obstructive pulmonary disease) ([place])    Coronary atherosclerosis of native coronary artery    BMS circ and PTCA PDA 2007, DES RCA 10/09, DES LAD 2/12   Essential hypertension    GERD (gastroesophageal reflux disease)    Hearing loss    Heart attack ([place])    x 3 (latest 2009)   History of kidney stones    Hyperlipidemia    Ischemic cardiomyopathy    NSTEMI (non-ST elevated myocardial infarction) ([place])    2007   Squamous cell lung cancer ([place])     PAST SURGICAL HISTORY: Past Surgical History:  Procedure  Laterality Date   ANGIOPLASTY     stent   BRONCHIAL BIOPSY  02/19/2020   Procedure: BRONCHIAL BIOPSIES;  Surgeon: Collene Gobble, MD;  Location[facility] ENDOSCOPY;  Service: Pulmonary;;   BRONCHIAL BRUSHINGS  02/19/2020   Procedure: BRONCHIAL BRUSHINGS;  Surgeon: Collene Gobble, MD;  Location: [facility] ENDOSCOPY;  Service: Pulmonary;;   BRONCHIAL NEEDLE ASPIRATION BIOPSY   02/19/2020   Procedure: BRONCHIAL NEEDLE ASPIRATION BIOPSIES;  Surgeon: Collene Gobble, MD;  Location: [facility] ENDOSCOPY;  Service: Pulmonary;;   COLONOSCOPY     FIDUCIAL MARKER PLACEMENT  02/19/2020   Procedure: FIDUCIAL MARKER PLACEMENT;  Surgeon: Collene Gobble, MD;  Location: [facility] ENDOSCOPY;  Service: Pulmonary;;   HERNIA REPAIR     LIPOMA EXCISION Right    hip/buttox   TONSILLECTOMY     as a child   VIDEO BRONCHOSCOPY WITH ENDOBRONCHIAL NAVIGATION N/A 02/19/2020   Procedure: VIDEO BRONCHOSCOPY WITH ENDOBRONCHIAL NAVIGATION;  Surgeon: Collene Gobble, MD;  Location: [facility] ENDOSCOPY;  Service: Pulmonary;  Laterality: N/A;    FAMILY HISTORY:  Family History  Problem Relation Age of Onset   Throat cancer Father    Heart attack Mother    Alzheimer's disease Mother     SOCIAL HISTORY:  Social History   Tobacco Use   Smoking status: Every Day    Packs/day: 0.50    Years: 63.00    Pack years: 31.50    Types: Cigarettes    Start date: 06/15/1953   Smokeless tobacco: Never   Tobacco comments:    4 cigarettes smoked daily 03/05/20 [facility]   Vaping Use   Vaping Use: Never used  Substance Use Topics   Alcohol use: No    Alcohol/week: 0.0 standard drinks   Drug use: No    ALLERGIES:  Allergies  Allergen Reactions   Entresto [Sacubitril-Valsartan] Itching    MEDICATIONS:  Current Outpatient Medications  Medication Sig Dispense Refill   albuterol (VENTOLIN HFA) 108 (90 Base) MCG/ACT inhaler Inhale into the lungs every 6 (six) hours as needed for wheezing or shortness of breath. (Patient not taking: Reported on 04/15/2021)     amLODipine (NORVASC) 5 MG tablet Take 1 tablet (5 mg total) by mouth daily. 90 tablet 3   aspirin EC 81 MG tablet Take 1 tablet (81 mg total) by mouth daily. 90 tablet 3   clopidogrel (PLAVIX) 75 MG tablet Take 1 tablet (75 mg total) by mouth daily. 90 tablet 1   cyclobenzaprine (FLEXERIL) 5 MG tablet Take 1 tablet (5 mg total) by mouth 3 (three) times daily as  needed for muscle spasms. (Patient not taking: Reported on 04/15/2021) 90 tablet 0   Ensure (ENSURE) Take 237 mLs by mouth 2 (two) times daily.     fluticasone (FLONASE) 50 MCG/ACT nasal spray Place into both nostrils 3 (three) times daily as needed for allergies or rhinitis. (Patient not taking: Reported on 04/15/2021)     HYDROcodone-acetaminophen (NORCO) 7.5-325 MG tablet Take 1 tablet by mouth daily as needed. (Patient not taking: Reported on 04/15/2021)     metoprolol succinate (TOPROL XL) 25 MG 24 hr tablet Take 1 tablet (25 mg total) by mouth daily. 90 tablet 3   Naphazoline HCl (CLEAR EYES OP) Place 1 drop into both eyes daily as needed (irritation / redness).     nitroGLYCERIN (NITROSTAT) 0.4 MG SL tablet DISSOLVE ONE TABLET UNDER THE TONGUE EVERY 5 MINUTES AS NEEDED FOR CHEST PAIN.  DO NOT EXCEED A TOTAL OF 3 DOSES IN 15 MINUTES (  Patient not taking: Reported on 04/15/2021) 25 tablet 3   olmesartan (BENICAR) 40 MG tablet Take 1 tablet (40 mg total) by mouth daily. 30 tablet 1   OXYGEN Inhale into the lungs.     simvastatin (ZOCOR) 80 MG tablet Take 1 tablet (80 mg total) by mouth at bedtime. 90 tablet 1   Tiotropium Bromide Monohydrate (SPIRIVA RESPIMAT) 2.5 MCG/ACT AERS Inhale 2 puffs into the lungs daily. 4 g 11   umeclidinium bromide (INCRUSE ELLIPTA) 62.5 MCG/INH AEPB Inhale 1 puff into the lungs daily. 30 each 11   No current facility-administered medications for this encounter.    REVIEW OF SYSTEMS:  A 10+ POINT REVIEW OF SYSTEMS WAS OBTAINED including neurology, dermatology, psychiatry, cardiac, respiratory, lymph, extremities, GI, GU, musculoskeletal, constitutional, reproductive, HEENT. ***   PHYSICAL EXAM:  vitals were not taken for this visit.   General: Alert and oriented, in no acute distress HEENT: Head is normocephalic. Extraocular movements are intact. Oropharynx is clear. Neck: Neck is supple, no palpable cervical or supraclavicular lymphadenopathy. Heart: Regular in  rate and rhythm with no murmurs, rubs, or gallops. Chest: Clear to auscultation bilaterally, with no rhonchi, wheezes, or rales. Abdomen: Soft, nontender, nondistended, with no rigidity or guarding. Extremities: No cyanosis or edema. Lymphatics: see Neck Exam Skin: No concerning lesions. Musculoskeletal: symmetric strength and muscle tone throughout. Neurologic: Cranial nerves II through XII are grossly intact. No obvious focalities. Speech is fluent. Coordination is intact. Psychiatric: Judgment and insight are intact. Affect is appropriate. ***  ECOG = ***  0 - Asymptomatic (Fully active, able to carry on all predisease activities without restriction)  1 - Symptomatic but completely ambulatory (Restricted in physically strenuous activity but ambulatory and able to carry out work of a light or sedentary nature. For example, light housework, office work)  2 - Symptomatic, <50% in bed during the day (Ambulatory and capable of all self care but unable to carry out any work activities. Up and about more than 50% of waking hours)  3 - Symptomatic, >50% in bed, but not bedbound (Capable of only limited self-care, confined to bed or chair 50% or more of waking hours)  4 - Bedbound (Completely disabled. Cannot carry on any self-care. Totally confined to bed or chair)  5 - Death   Eustace Pen MM, Creech RH, Tormey DC, et al. [phone]). "Toxicity and response criteria of the [facility]". [place] Oncol. 5 (6): 649-55  LABORATORY DATA:  Lab Results  Component Value Date   WBC 6.8 03/18/2021   HGB 15.5 03/18/2021   HCT 45.7 03/18/2021   MCV 96.4 03/18/2021   PLT 209 03/18/2021   NEUTROABS 4.3 03/18/2021   Lab Results  Component Value Date   NA 136 03/18/2021   K 4.1 03/18/2021   CL 106 03/18/2021   CO2 23 03/18/2021   GLUCOSE 97 03/18/2021   CREATININE 1.02 03/18/2021   CALCIUM 8.9 03/18/2021      RADIOGRAPHY: NM PET Image Restag (PS) Skull Base To  Thigh  Result Date: 04/13/2021 CLINICAL DATA:  Subsequent treatment strategy for non-small-cell lung cancer. Restaging. Status post radiation therapy for left lower lobe primary. EXAM: NUCLEAR MEDICINE PET SKULL BASE TO THIGH TECHNIQUE: 7.5 mCi F-18 FDG was injected intravenously. Full-ring PET imaging was performed from the skull base to thigh after the radiotracer. CT data was obtained and used for attenuation correction and anatomic localization. Fasting blood glucose: 95 mg/dl COMPARISON:  Multiple chest CTs, most recent 03/18/2021. Most recent PET  FINDINGS: Mediastinal blood pool activity: SUV max 1.8 Liver activity: SUV max NA NECK: No areas of abnormal hypermetabolism. Incidental CT findings: No cervical adenopathy. Dense carotid atherosclerosis bilaterally. Mucosal thickening of left maxillary sinus. CHEST: Biapical pleuroparenchymal scarring. Low-level hypermetabolism within, including at a S.U.V. max of 2.4 on the left. Again identified are areas of hilar hypermetabolism, without well-defined adenopathy. Example on the right at a S.U.V. max of 3.2 and on the left at a S.U.V. max of 3.1. The left lower lobe primary measures 2.1 x 2.2 cm and a S.U.V. max of 5.7, including on 184/7. Incidental CT findings: Deferred to recent diagnostic CT. Centrilobular emphysema. Aortic and coronary artery calcification. ABDOMEN/PELVIS: No abdominopelvic nodal hypermetabolism. Left-sided prostatic hypermetabolism in the setting of focal calcifications. Example at a S.U.V. max of 4.1 on 184/3. Incidental CT findings: Infrarenal abdominal aortic dilatation at 3.2 cm. Normal adrenal glands. Renal vascular calcifications. Bladder wall thickening, suggesting a component of outlet obstruction. Fat containing left inguinal hernia. SKELETON: No abnormal marrow activity. Incidental CT findings: No acute osseous abnormality. IMPRESSION: 1. Focal hypermetabolism corresponding to the left lower lobe pulmonary nodule,  consistent with residual/locally recurrent disease. 2. Low-level bilateral hilar hypermetabolism without adenopathy, similar to the prior PET and favored to be physiologic or reactive. 3. Left-sided prostatic hypermetabolism with concurrent calcifications. Possibly related to pancreatitis. Consider correlation with PSA level and possibly physical exam. 4. Incidental findings, including: Aortic atherosclerosis (ICD10-I70.0), coronary artery atherosclerosis and emphysema (ICD10-J43.9). Sinus disease. 5. Infrarenal aortic dilatation including at 3.2 cm. Recommend follow-up ultrasound every 3 years. This recommendation follows ACR consensus guidelines: White Paper of the ACR Incidental Findings Committee II on Vascular Findings. J Am Coll Radiol 2013; 10:789-794. Electronically Signed   By: Kyle  Talbot M.D.   On: 04/13/2021 11:37   °   °IMPRESSION: Clinical T1cN0M0 squamous cell carcinoma of the left lower lung ° °*** ° °Today, I talked to the patient and family about the findings and work-up thus far.  We discussed the natural history of *** and general treatment, highlighting the role of radiotherapy in the management.  We discussed the available radiation techniques, and focused on the details of logistics and delivery.  We reviewed the anticipated acute and late sequelae associated with radiation in this setting.  The patient was encouraged to ask questions that I answered to the best of my ability. *** A patient consent form was discussed and signed.  We retained a copy for our records.  The patient would like to proceed with radiation and will be scheduled for CT simulation. ° °PLAN: ***  ° ° °*** minutes of total time was spent for this patient encounter, including preparation, face-to-face counseling with the patient and coordination of care, physical exam, and documentation of the encounter. °  °------------------------------------------------ ° °Zyeir D. Kinard, PhD, MD ° °This document serves as a record of  services personally performed by Kelvyn Kinard, MD. It was created on his behalf by Elisa Frazier, a trained medical scribe. The creation of this record is based on the scribe's personal observations and the provider's statements to them. This document has been checked and approved by the attending provider. ° ° ° °

## 2021-05-06 ENCOUNTER — Ambulatory Visit: Payer: Medicare HMO

## 2021-05-06 ENCOUNTER — Ambulatory Visit
Admission: RE | Admit: 2021-05-06 | Discharge: 2021-05-06 | Disposition: A | Discharge status: Home / Self Care | Payer: Medicare HMO | Source: Ambulatory Visit | Attending: Radiation Oncology | Admitting: Radiation Oncology

## 2021-05-06 DIAGNOSIS — J441 Chronic obstructive pulmonary disease with (acute) exacerbation: Secondary | ICD-10-CM | POA: Diagnosis not present

## 2021-05-06 DIAGNOSIS — R059 Cough, unspecified: Secondary | ICD-10-CM | POA: Diagnosis not present

## 2021-05-07 DIAGNOSIS — I2510 Atherosclerotic heart disease of native coronary artery without angina pectoris: Secondary | ICD-10-CM | POA: Diagnosis not present

## 2021-05-07 DIAGNOSIS — J449 Chronic obstructive pulmonary disease, unspecified: Secondary | ICD-10-CM | POA: Diagnosis not present

## 2021-05-13 ENCOUNTER — Other Ambulatory Visit: Payer: Self-pay | Admitting: Cardiology

## 2021-05-13 NOTE — Progress Notes (Signed)
Location of tumor and Histology per Pathology Report: squamous cell carcinoma of the left lower lung  Biopsy: 02/19/2020 revealed Squamous cell carcinoma  Past/Anticipated interventions by surgeon, if any:   Surgeon: Baltazar Apo Operation: Flexible video fiberoptic bronchoscopy with electromagnetic navigation and biopsies.  Past/Anticipated interventions by medical oncology, if any: Chemotherapy not at this time    Pain issues, if any:  no   SAFETY ISSUES: Prior radiation? yes, left lung 04/14/2020-04/24/2020 Pacemaker/ICD? no Possible current pregnancy? no Is the patient on methotrexate? no  Current Complaints / other details:  frequent productive cough with clear phlegm without hemoptysis, increased anxiety, muscle spasms and cramps in legs, insomnia     Vitals:   05/20/21 1502  BP: 128/73  Pulse: 78  Resp: 18  Temp: (!) 96.5 F (35.8 C)  TempSrc: Temporal  SpO2: 96%  Weight: 136 lb 2 oz (61.7 kg)  Height: 5\' 10"  (1.778 m)

## 2021-05-19 NOTE — Progress Notes (Signed)
Radiation Oncology         [phone] ________________________________  Outpatient Re-Consultation  Name: [name] Graham MRN: [id number]  Date: 05/20/2021  DOB: [birth year]  CC:Lysbeth Penner, FNP  Derek Jack, MD   REFERRING PHYSICIAN: Derek Jack, MD  Diagnosis: The encounter diagnosis was Squamous cell lung cancer, left ([place]).  Clinical T1cN0M0 squamous cell carcinoma of the left lower lung  Interval Since Last Radiation: 1 year and 25 days  Radiation Treatment Dates: 04/14/2020 through 04/24/2020 Site Technique Total Dose (Gy) Dose per Fx (Gy) Completed Fx Beam Energies  Lung, Left: Lung_Lt SBRT 50/50 10 5/5 6XFFF    Narrative::[name] Graham is a 86 y.o. male who returns today for re-evaluation, courtesy of Dr. Delton Coombes, for an opinion concerning further radiation therapy as part of management for his diagnosed left lung cancer. I last saw the patient for follow up on 05/29/20 following completion of his SBRT treatment directed at the left lung. Since that time, the patient has been on surveillance with Dr. Delton Coombes, and has been doing well other than continued SOB.  Recently, the patient presented for a follow-up Chest CT on 03/18/21 which showed the left lower lobe medial infrahilar spiculated mass abutting the left ventricle to measure 2.3 x 1.9 cm, previously 2.1 x 1.6 cm in July 2022, and 1.8 x 1.5 cm in March 2022.  Given an evident, gradual increase in size of the LLL nodule, the patient met with Dr. Delton Coombes on 03/30/21 who recommended further evaluation via PET scan.  PET on 04/09/21 showed the left lower lobe mass to measure 2.1 x 2.2 cm, with an SUV max of 5.7. Hilar hypermetabolism without well-defined adenopathy was again noted, and appeared similar to pretreatment PET scan from August 2021.  No other suspicious areas were noted on the patient's most recent PET scan.  The patient again followed up with Dr. Delton Coombes on 04/15/21 who  referred the patient to me here today to determine if any further radiation treatment is feasible in treatment of the LLL mass.   Of note: the patient presented to the [facility] on 05/04/21 with complaints of severe cough COPD symptoms and shortness of breath. Patient was determined to likely have COPD exacerbation, and was recommended otc tylenol and motrin. He was instructed to return to clinic or follow up if his symptoms persist or worsen.  Other pertinent imaging since the patient was last seen is as follows:  --Chest CT on 07/09/20 showed: a significant interval decrease in size of the infrahilar mass of the left lower lobe, consistent with treatment response; stable subpleural consolidation or nodule of the peripheral left lung base measuring 1.3 x 0.9 cm; unchanged prominent pretracheal and bilateral hilar lymph nodes, (with the largest pretracheal nodes measuring up to 2.3 x 1.4 cm); and diffuse bilateral bronchial wall thickening and frothy debris in the dependent airways bilaterally. --Chest CT on 11/12/20 showed: the left infrahilar lesion of similar size compared to most recent study, with evolving post treatment changes and diminished basilar nodularity; a likely inflammatory or infectious basilar nodularity; diminished size of the mediastinal lymph nodes and particularly the dominant node anterior to the carina.   Patient reports that his breathing overall has been stable over the past few months with occasional acute exacerbations of his COPD.  He denies any pain within the chest area significant cough or hemoptysis.  He denies any new headaches dizziness or blurred vision.  He has supplemental oxygen at home and will occasionally  use this as needed.   PAST MEDICAL HISTORY:  Past Medical History:  Diagnosis Date   Arthritis    COPD (chronic obstructive pulmonary disease) ([place])    Coronary atherosclerosis of native coronary artery    BMS circ and PTCA PDA 2007, DES RCA  10/09, DES LAD 2/12   Essential hypertension    GERD (gastroesophageal reflux disease)    Hearing loss    Heart attack ([place])    x 3 (latest 2009)   History of kidney stones    Hyperlipidemia    Ischemic cardiomyopathy    NSTEMI (non-ST elevated myocardial infarction) ([place])    2007   Squamous cell lung cancer ([place])     PAST SURGICAL HISTORY: Past Surgical History:  Procedure Laterality Date   ANGIOPLASTY     stent   BRONCHIAL BIOPSY  02/19/2020   Procedure: BRONCHIAL BIOPSIES;  Surgeon: Collene Gobble, MD;  [facility]: [facility];  Service: Pulmonary;;   BRONCHIAL BRUSHINGS  02/19/2020   Procedure: BRONCHIAL BRUSHINGS;  Surgeon: Collene Gobble, MD;  Location: [facility] ENDOSCOPY;  Service: Pulmonary;;   BRONCHIAL NEEDLE ASPIRATION BIOPSY  02/19/2020   Procedure: BRONCHIAL NEEDLE ASPIRATION BIOPSIES;  Surgeon: Collene Gobble, MD;  Location: [facility];  Service: Pulmonary;;   COLONOSCOPY     FIDUCIAL MARKER PLACEMENT  02/19/2020   Procedure: FIDUCIAL MARKER PLACEMENT;  Surgeon: Collene Gobble, MD;  Location: [facility];  Service: Pulmonary;;   HERNIA REPAIR     LIPOMA EXCISION Right    hip/buttox   TONSILLECTOMY     as a child   VIDEO BRONCHOSCOPY WITH ENDOBRONCHIAL NAVIGATION N/A 02/19/2020   Procedure: VIDEO BRONCHOSCOPY WITH ENDOBRONCHIAL NAVIGATION;  Surgeon: Collene Gobble, MD;  Location: [facility];  Service: Pulmonary;  Laterality: N/A;    FAMILY HISTORY:  Family History  Problem Relation Age of Onset   Throat cancer Father    Heart attack Mother    Alzheimer's disease Mother     SOCIAL HISTORY:  Social History   Tobacco Use   Smoking status: Every Day    Packs/day: 0.50    Years: 63.00    Pack years: 31.50    Types: Cigarettes    Start date: 06/15/1951   Smokeless tobacco: Never   Tobacco comments:    4 cigarettes smoked daily 03/05/20 ARJ   Vaping Use   Vaping Use: Never used  Substance Use Topics   Alcohol use: No    Alcohol/week: 0.0 standard  drinks   Drug use: No    ALLERGIES:  Allergies  Allergen Reactions   Entresto [Sacubitril-Valsartan] Itching    MEDICATIONS:  Current Outpatient Medications  Medication Sig Dispense Refill   albuterol (VENTOLIN HFA) 108 (90 Base) MCG/ACT inhaler Inhale into the lungs every 6 (six) hours as needed for wheezing or shortness of breath.     amLODipine (NORVASC) 5 MG tablet Take 1 tablet (5 mg total) by mouth daily. 90 tablet 3   aspirin EC 81 MG tablet Take 1 tablet (81 mg total) by mouth daily. 90 tablet 3   clopidogrel (PLAVIX) 75 MG tablet Take 1 tablet (75 mg total) by mouth daily. 90 tablet 1   cyclobenzaprine (FLEXERIL) 5 MG tablet Take 1 tablet (5 mg total) by mouth 3 (three) times daily as needed for muscle spasms. 90 tablet 0   Ensure (ENSURE) Take 237 mLs by mouth 2 (two) times daily.     fluticasone (FLONASE) 50 MCG/ACT nasal spray Place into both nostrils 3 (three) times  daily as needed for allergies or rhinitis.     HYDROcodone-acetaminophen (NORCO) 7.5-325 MG tablet Take 1 tablet by mouth daily as needed.     metoprolol succinate (TOPROL XL) 25 MG 24 hr tablet Take 1 tablet (25 mg total) by mouth daily. 90 tablet 3   Naphazoline HCl (CLEAR EYES OP) Place 1 drop into both eyes daily as needed (irritation / redness).     nitroGLYCERIN (NITROSTAT) 0.4 MG SL tablet DISSOLVE ONE TABLET UNDER THE TONGUE EVERY 5 MINUTES AS NEEDED FOR CHEST PAIN.  DO NOT EXCEED A TOTAL OF 3 DOSES IN 15 MINUTES 25 tablet 3   olmesartan (BENICAR) 40 MG tablet Take 1 tablet (40 mg total) by mouth daily. 30 tablet 1   OXYGEN Inhale into the lungs.     simvastatin (ZOCOR) 80 MG tablet TAKE 1 TABLET BY MOUTH AT BEDTIME 90 tablet 3   Tiotropium Bromide Monohydrate (SPIRIVA RESPIMAT) 2.5 MCG/ACT AERS Inhale 2 puffs into the lungs daily. 4 g 11   umeclidinium bromide (INCRUSE ELLIPTA) 62.5 MCG/INH AEPB Inhale 1 puff into the lungs daily. 30 each 11   No current facility-administered medications for this  encounter.    REVIEW OF SYSTEMS:  A 10+ POINT REVIEW OF SYSTEMS WAS OBTAINED including neurology, dermatology, psychiatry, cardiac, respiratory, lymph, extremities, GI, GU, musculoskeletal, constitutional, reproductive, HEENT.  Review of systems as above pertinent to patient's history.   PHYSICAL EXAM:  height is 5' 10"  (1.778 m) and weight is 136 lb 2 oz (61.7 kg). His temporal temperature is 96.5 F (35.8 C) (abnormal). His blood pressure is 128/73 and his pulse is 78. His respiration is 18 and oxygen saturation is 96%.   General: Alert and oriented, in no acute distress HEENT: Head is normocephalic. Extraocular movements are intact.  Neck: Neck is supple, no palpable cervical or supraclavicular lymphadenopathy. Heart: Regular in rate and rhythm with no murmurs, rubs, or gallops. Chest: Clear to auscultation bilaterally, with no rhonchi, wheezes, or rales. Abdomen: Soft, nontender, nondistended, with no rigidity or guarding. Extremities: No cyanosis or edema. Lymphatics: see Neck Exam Skin: No concerning lesions. Musculoskeletal: symmetric strength and muscle tone throughout. Neurologic: Cranial nerves II through XII are grossly intact. No obvious focalities. Speech is fluent. Coordination is intact. Psychiatric: Judgment and insight are intact. Affect is appropriate.   ECOG = 1  0 - Asymptomatic (Fully active, able to carry on all predisease activities without restriction)  1 - Symptomatic but completely ambulatory (Restricted in physically strenuous activity but ambulatory and able to carry out work of a light or sedentary nature. For example, light housework, office work)  2 - Symptomatic, <50% in bed during the day (Ambulatory and capable of all self care but unable to carry out any work activities. Up and about more than 50% of waking hours)  3 - Symptomatic, >50% in bed, but not bedbound (Capable of only limited self-care, confined to bed or chair 50% or more of waking  hours)  4 - Bedbound (Completely disabled. Cannot carry on any self-care. Totally confined to bed or chair)  5 - Death   Eustace Pen MM, Creech RH, Tormey DC, et al. [phone]). "Toxicity and response criteria of the [facility]". [place] Oncol. 5 (6): 649-55  LABORATORY DATA:  Lab Results  Component Value Date   WBC 6.8 03/18/2021   HGB 15.5 03/18/2021   HCT 45.7 03/18/2021   MCV 96.4 03/18/2021   PLT 209 03/18/2021   NEUTROABS 4.3 03/18/2021  Lab Results  Component Value Date   NA 136 03/18/2021   K 4.1 03/18/2021   CL 106 03/18/2021   CO2 23 03/18/2021   GLUCOSE 97 03/18/2021   CREATININE 1.02 03/18/2021   CALCIUM 8.9 03/18/2021      RADIOGRAPHY: No results found.    IMPRESSION: Clinical T1cN0M0 squamous cell carcinoma of the left lower lung  The patient was initially treated with SBRT for his medially based left infrahilar lesion.  This treatment occurred approximately a year ago and the patient had good tumor shrinkage however more recently there has been noted to be serial enlargement of this lesion.Marland Kitchen  PET scan was performed showed showed increased activity consistent with residual tumor.  The SUV on his current PET scan is approximately half of what it was on his initial presentation over a year ago.  I have carefully reviewed the patient's previous SBRT treatments.  His lesion is adjacent to the left ventricle and with his previous SBRT treatments we approached normal dose constraints to the left ventricle.  Given that his current problem is in the same location adjacent to the left ventricle he is not a candidate for hypofractionated or conventional radiation therapy.  He is also not a candidate for SBRT given that he is already received SBRT treatments to this lesion.  Unfortunately there are no good radiation therapy options for the patient.  In light of his COPD and significant coronary artery disease he is also not a candidate for surgery and this  area would be very difficult to biopsy to see if potentially targeted therapy may be of benefit.  The patient does not wish to be considered for additional biopsy.  PLAN: I have recommended the patient follow-up with Dr. Delton Coombes to see if he may be a potential candidate for immunotherapy.  Patient's father was treated with lung cancer and he does not wish to consider chemotherapy as a component of his treatment.  As needed follow-up in radiation oncology   35 minutes of total time was spent for this patient encounter, including preparation, face-to-face counseling with the patient and coordination of care, physical exam, and documentation of the encounter.   ------------------------------------------------  Blair Promise, PhD, MD  This document serves as a record of services personally performed by Gery Pray, MD. It was created on his behalf by Roney Mans, a trained medical scribe. The creation of this record is based on the scribe's personal observations and the provider's statements to them. This document has been checked and approved by the attending provider.

## 2021-05-20 ENCOUNTER — Ambulatory Visit
Admission: RE | Admit: 2021-05-20 | Discharge: 2021-05-20 | Disposition: A | Discharge status: Home / Self Care | Payer: Medicare HMO | Source: Ambulatory Visit | Attending: Radiation Oncology | Admitting: Radiation Oncology

## 2021-05-20 ENCOUNTER — Encounter: Payer: Self-pay | Admitting: Radiation Oncology

## 2021-05-20 ENCOUNTER — Other Ambulatory Visit: Payer: Self-pay

## 2021-05-20 VITALS — BP 128/73 | HR 78 | Temp 96.5°F | Resp 18 | Ht 70.0 in | Wt 136.1 lb

## 2021-05-20 DIAGNOSIS — I2510 Atherosclerotic heart disease of native coronary artery without angina pectoris: Secondary | ICD-10-CM | POA: Insufficient documentation

## 2021-05-20 DIAGNOSIS — I10 Essential (primary) hypertension: Secondary | ICD-10-CM | POA: Insufficient documentation

## 2021-05-20 DIAGNOSIS — Z87442 Personal history of urinary calculi: Secondary | ICD-10-CM | POA: Diagnosis not present

## 2021-05-20 DIAGNOSIS — I252 Old myocardial infarction: Secondary | ICD-10-CM | POA: Insufficient documentation

## 2021-05-20 DIAGNOSIS — E785 Hyperlipidemia, unspecified: Secondary | ICD-10-CM | POA: Diagnosis not present

## 2021-05-20 DIAGNOSIS — K219 Gastro-esophageal reflux disease without esophagitis: Secondary | ICD-10-CM | POA: Diagnosis not present

## 2021-05-20 DIAGNOSIS — Z85118 Personal history of other malignant neoplasm of bronchus and lung: Secondary | ICD-10-CM | POA: Insufficient documentation

## 2021-05-20 DIAGNOSIS — Z79899 Other long term (current) drug therapy: Secondary | ICD-10-CM | POA: Diagnosis not present

## 2021-05-20 DIAGNOSIS — F17210 Nicotine dependence, cigarettes, uncomplicated: Secondary | ICD-10-CM | POA: Diagnosis not present

## 2021-05-20 DIAGNOSIS — J441 Chronic obstructive pulmonary disease with (acute) exacerbation: Secondary | ICD-10-CM | POA: Insufficient documentation

## 2021-05-20 DIAGNOSIS — Z7982 Long term (current) use of aspirin: Secondary | ICD-10-CM | POA: Insufficient documentation

## 2021-05-20 DIAGNOSIS — I255 Ischemic cardiomyopathy: Secondary | ICD-10-CM | POA: Diagnosis not present

## 2021-05-20 DIAGNOSIS — Z87891 Personal history of nicotine dependence: Secondary | ICD-10-CM | POA: Diagnosis not present

## 2021-05-20 DIAGNOSIS — Z801 Family history of malignant neoplasm of trachea, bronchus and lung: Secondary | ICD-10-CM | POA: Diagnosis not present

## 2021-05-20 DIAGNOSIS — C3432 Malignant neoplasm of lower lobe, left bronchus or lung: Secondary | ICD-10-CM | POA: Insufficient documentation

## 2021-05-20 DIAGNOSIS — C3492 Malignant neoplasm of unspecified part of left bronchus or lung: Secondary | ICD-10-CM

## 2021-05-20 NOTE — Progress Notes (Signed)
See MD note for nursing evaluation. °

## 2021-05-27 DIAGNOSIS — M5136 Other intervertebral disc degeneration, lumbar region: Secondary | ICD-10-CM | POA: Diagnosis not present

## 2021-05-27 DIAGNOSIS — M4726 Other spondylosis with radiculopathy, lumbar region: Secondary | ICD-10-CM | POA: Diagnosis not present

## 2021-05-27 DIAGNOSIS — G894 Chronic pain syndrome: Secondary | ICD-10-CM | POA: Diagnosis not present

## 2021-05-27 DIAGNOSIS — M48061 Spinal stenosis, lumbar region without neurogenic claudication: Secondary | ICD-10-CM | POA: Diagnosis not present

## 2021-05-30 ENCOUNTER — Other Ambulatory Visit: Payer: Self-pay | Admitting: Cardiology

## 2021-06-01 ENCOUNTER — Other Ambulatory Visit: Payer: Self-pay

## 2021-06-01 ENCOUNTER — Inpatient Hospital Stay (HOSPITAL_COMMUNITY): Payer: Medicare HMO | Attending: Hematology | Admitting: Hematology

## 2021-06-01 VITALS — BP 113/98 | HR 111 | Temp 97.5°F | Resp 18 | Ht 70.0 in | Wt 137.7 lb

## 2021-06-01 DIAGNOSIS — R634 Abnormal weight loss: Secondary | ICD-10-CM | POA: Insufficient documentation

## 2021-06-01 DIAGNOSIS — I10 Essential (primary) hypertension: Secondary | ICD-10-CM | POA: Diagnosis not present

## 2021-06-01 DIAGNOSIS — C3492 Malignant neoplasm of unspecified part of left bronchus or lung: Secondary | ICD-10-CM

## 2021-06-01 DIAGNOSIS — F17210 Nicotine dependence, cigarettes, uncomplicated: Secondary | ICD-10-CM | POA: Diagnosis not present

## 2021-06-01 DIAGNOSIS — Z808 Family history of malignant neoplasm of other organs or systems: Secondary | ICD-10-CM | POA: Diagnosis not present

## 2021-06-01 DIAGNOSIS — C3432 Malignant neoplasm of lower lobe, left bronchus or lung: Secondary | ICD-10-CM | POA: Insufficient documentation

## 2021-06-01 NOTE — Progress Notes (Signed)
[name] Graham, [place] [address]   CLINIC:  Medical Oncology/Hematology  PCP:  Lysbeth Penner, [address] / [place] [address] [phone]   REASON FOR VISIT:  Follow-up for left squamous cell lung cancer  PRIOR THERAPY: Left lung SBRT 50 Gy in 5 fractions from 04/14/2020 to 04/24/2020  NGS Results: not done  CURRENT THERAPY: surveillance  BRIEF ONCOLOGIC HISTORY:  Oncology History  Squamous cell lung cancer, left ([place])  12/08/2016 Initial Diagnosis   Squamous cell lung cancer, left ([place])   11/12/2020 Imaging   1. LEFT infrahilar lesion of similar size compared to most recent study with evolving post treatment changes and diminished basilar nodularity that was seen previously. 2. Basilar nodularity discussed above likely post inflammatory or infectious, attention on follow-up. No new or progressive findings. 3. Diminishing size of mediastinal lymph nodes and particularly the dominant node anterior to the carina. 4. Three-vessel coronary artery disease. 5. Emphysema and aortic atherosclerosis.       CANCER STAGING:  Cancer Staging  No matching staging information was found for the patient.  INTERVAL HISTORY:  [name] Graham, a 86 y.o. male, returns for routine follow-up of his left squamous cell lung cancer. [name] Graham was last seen on 04/15/2021.   Today he reports feeling well. He denies hemoptysis. He continues to have a cough. He reports occasional CP due to indigestion.   REVIEW OF SYSTEMS:  Review of Systems  Constitutional:  Positive for fatigue. Negative for appetite change.  Respiratory:  Positive for cough and shortness of breath. Negative for hemoptysis.   Cardiovascular:  Positive for chest pain (indigestion).  Genitourinary:  Positive for frequency.   Neurological:  Positive for numbness.  Psychiatric/Behavioral:  Positive for sleep disturbance.   All other systems reviewed and are negative.  PAST  MEDICAL/SURGICAL HISTORY:  Past Medical History:  Diagnosis Date   Arthritis    COPD (chronic obstructive pulmonary disease) ([place])    Coronary atherosclerosis of native coronary artery    BMS circ and PTCA PDA 2007, DES RCA 10/09, DES LAD 2/12   Essential hypertension    GERD (gastroesophageal reflux disease)    Hearing loss    Heart attack ([place])    x 3 (latest 2009)   History of kidney stones    Hyperlipidemia    Ischemic cardiomyopathy    NSTEMI (non-ST elevated myocardial infarction) ([place])    2007   Squamous cell lung cancer [facility])    Past Surgical History:  Procedure Laterality Date   ANGIOPLASTY     stent   BRONCHIAL BIOPSY  02/19/2020   Procedure: BRONCHIAL BIOPSIES;  Surgeon: Collene Gobble, MD;  Location: [facility] [place];  Service: Pulmonary;;   BRONCHIAL BRUSHINGS  02/19/2020   Procedure: BRONCHIAL BRUSHINGS;  Surgeon: Collene Gobble, MD;  Location: [facility] ENDOSCOPY;  Service: Pulmonary;;   BRONCHIAL NEEDLE ASPIRATION BIOPSY  02/19/2020   Procedure: BRONCHIAL NEEDLE ASPIRATION BIOPSIES;  Surgeon: Collene Gobble, MD;  [facility] ENDOS[facility];  Service: Pulmonary;;   COLONOSCOPY     FIDUCIAL MARKER PLACEMENT  02/19/2020   Procedure: FIDUCIAL MARKER PLACEMENT;  Surgeon: Collene Gobble, MD;  Location: [facility] ENDOS[facility];  Service: Pulmonary;;   HERNIA REPAIR     LIPOMA EXCISION Right    hip/buttox   TONSILLECTOMY     as a child   VIDEO BRONCHOSCOPY WITH ENDOBRONCHIAL NAVIGATION N/A 02/19/2020   Procedure: VIDEO BRONCHOSCOPY WITH ENDOBRONCHIAL NAVIGATION;  Surgeon: Collene Gobble, MD;  Location: [facility]OS[facility];  Service: Pulmonary;  Laterality: N/A;    SOCIAL HISTORY:  Social History   Socioeconomic History   Marital status: Divorced    Spouse name: Not on file   Number of children: Not on file   Years of education: Not on file   Highest education level: Not on file  Occupational History   Occupation: Retired  Tobacco Use   Smoking status: Every Day    Packs/day: 0.50     Years: 63.00    Pack years: 31.50    Types: Cigarettes    Start date: 06/15/1953   Smokeless tobacco: Never   Tobacco comments:    4 cigarettes smoked daily 03/05/20 ARJ   Vaping Use   Vaping Use: Never used  Substance and Sexual Activity   Alcohol use: No    Alcohol/week: 0.0 standard drinks   Drug use: No   Sexual activity: Not Currently  Other Topics Concern   Not on file  Social History Narrative   Not on file   Social Determinants of Health   Financial Resource Strain: Not on file  Food Insecurity: Not on file  Transportation Needs: Not on file  Physical Activity: Not on file  Stress: Not on file  Social Connections: Not on file  Intimate Partner Violence: Not on file    FAMILY HISTORY:  Family History  Problem Relation Age of Onset   Throat cancer Father    Heart attack Mother    Alzheimer's disease Mother     CURRENT MEDICATIONS:  Current Outpatient Medications  Medication Sig Dispense Refill   albuterol (VENTOLIN HFA) 108 (90 Base) MCG/ACT inhaler Inhale into the lungs every 6 (six) hours as needed for wheezing or shortness of breath.     amLODipine (NORVASC) 5 MG tablet Take 1 tablet (5 mg total) by mouth daily. 90 tablet 3   aspirin 325 MG tablet Take by mouth.     clopidogrel (PLAVIX) 75 MG tablet TAKE 1 TABLET BY MOUTH ONCE DAILY . APPOINTMENT REQUIRED FOR FUTURE REFILLS 90 tablet 3   cyclobenzaprine (FLEXERIL) 5 MG tablet Take 1 tablet (5 mg total) by mouth 3 (three) times daily as needed for muscle spasms. 90 tablet 0   Ensure (ENSURE) Take 237 mLs by mouth 2 (two) times daily.     fluticasone (FLONASE) 50 MCG/ACT nasal spray Place into both nostrils 3 (three) times daily as needed for allergies or rhinitis.     HYDROcodone-acetaminophen (NORCO) 7.5-325 MG tablet Take 1 tablet by mouth daily as needed.     metoprolol succinate (TOPROL XL) 25 MG 24 hr tablet Take 1 tablet (25 mg total) by mouth daily. 90 tablet 3   Naphazoline HCl (CLEAR EYES OP) Place 1  drop into both eyes daily as needed (irritation / redness).     nitroGLYCERIN (NITROSTAT) 0.4 MG SL tablet DISSOLVE ONE TABLET UNDER THE TONGUE EVERY 5 MINUTES AS NEEDED FOR CHEST PAIN.  DO NOT EXCEED A TOTAL OF 3 DOSES IN 15 MINUTES 25 tablet 3   olmesartan (BENICAR) 40 MG tablet Take 1 tablet (40 mg total) by mouth daily. 30 tablet 1   OXYGEN Inhale into the lungs.     pantoprazole (PROTONIX) 40 MG tablet Take by mouth.     simvastatin (ZOCOR) 80 MG tablet TAKE 1 TABLET BY MOUTH AT BEDTIME 90 tablet 3   Tiotropium Bromide Monohydrate (SPIRIVA RESPIMAT) 2.5 MCG/ACT AERS Inhale 2 puffs into the lungs daily. 4 g 11   tiZANidine (ZANAFLEX) 4  MG tablet Take by mouth.     umeclidinium bromide (INCRUSE ELLIPTA) 62.5 MCG/INH AEPB Inhale 1 puff into the lungs daily. 30 each 11   No current facility-administered medications for this visit.    ALLERGIES:  Allergies  Allergen Reactions   Entresto [Sacubitril-Valsartan] Itching    PHYSICAL EXAM:  Performance status (ECOG): 2 - Symptomatic, <50% confined to bed  Vitals:   06/01/21 0928  BP: (!) 113/98  Pulse: (!) 111  Resp: 18  Temp: (!) 97.5 F (36.4 C)  SpO2: 98%   Wt Readings from Last 3 Encounters:  06/01/21 137 lb 11.2 oz (62.5 kg)  05/20/21 136 lb 2 oz (61.7 kg)  04/15/21 139 lb 3.2 oz (63.1 kg)   Physical Exam Vitals reviewed.  Constitutional:      Appearance: Normal appearance.  Cardiovascular:     Rate and Rhythm: Normal rate and regular rhythm.     Pulses: Normal pulses.     Heart sounds: Normal heart sounds.  Pulmonary:     Effort: Pulmonary effort is normal.     Breath sounds: Normal breath sounds.  Neurological:     General: No focal deficit present.     Mental Status: He is alert and oriented to person, place, and time.  Psychiatric:        Mood and Affect: Mood normal.        Behavior: Behavior normal.     LABORATORY DATA:  I have reviewed the labs as listed.  CBC Latest Ref Rng & Units 03/18/2021 11/12/2020  07/09/2020  WBC 4.0 - 10.5 K/uL 6.8 7.3 5.4  Hemoglobin 13.0 - 17.0 g/dL 15.5 16.4 15.5  Hematocrit 39.0 - 52.0 % 45.7 48.1 46.1  Platelets 150 - 400 K/uL 209 188 196   CMP Latest Ref Rng & Units 03/18/2021 11/12/2020 07/09/2020  Glucose 70 - 99 mg/dL 97 103(H) 122(H)  BUN 8 - 23 mg/dL 22 15 20   Creatinine 0.61 - 1.24 mg/dL 1.02 1.08 1.04  Sodium 135 - 145 mmol/L 136 135 138  Potassium 3.5 - 5.1 mmol/L 4.1 4.5 4.2  Chloride 98 - 111 mmol/L 106 102 106  CO2 22 - 32 mmol/L 23 25 24   Calcium 8.9 - 10.3 mg/dL 8.9 9.2 9.0  Total Protein 6.5 - 8.1 g/dL 7.4 7.5 7.1  Total Bilirubin 0.3 - 1.2 mg/dL 0.6 0.9 0.7  Alkaline Phos 38 - 126 U/L 56 53 51  AST 15 - 41 U/L 18 19 19   ALT 0 - 44 U/L 12 13 14     DIAGNOSTIC IMAGING:  I have independently reviewed the scans and discussed with the patient. No results found.   ASSESSMENT:  1.  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 -Patient placed on O2 nasal cannula in the last 3 weeks for drop in sats on exertion. -PET scan on 01/28/2020 shows 2.6 cm left lower lobe nodule SUV 10.1.  Focal hypermetabolic them in the left hilum identified without discrete lymph node visible on CT.  10 mm short axis precarinal lymph node with low-level SUV 2.9.  7 mm short axis right hilar node with SUV 3.8. -Navigational bronchoscopy and biopsy of the left lower lobe lung lesion consistent with squamous cell carcinoma. -IMRT to the left lower lung mass from 04/14/2020 through 04/24/2020, 50 Gy/ 5 fractions.   2.  Social/family history: -2 pack/day smoker for 68 years.  Currently smokes  4 cigarettes/day. 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 -Father had throat cancer.   PLAN:  1.  Clinical T1c N0 M0 squamous cell carcinoma of left lung lower lobe: -PET scan on 04/09/2021 showed left lower lobe lung mass measuring 2.1 x  2.2 cm, SUV 5.7.  Hilar hypermetabolism without well-defined adenopathy was again noted, similar to pretreatment PET scan. - He was evaluated by Dr. Sondra Come.  He was not a candidate for SBRT or conventional radiation.  He is not a candidate for surgical resection. - I have recommended NGS testing on previous biopsy as he did not have a biopsy this time.  He was reluctant to consider another biopsy.  I have also done a guardant 360 test today. - We will see him back in 3 weeks for follow-up.  We will plan to discuss further treatment options based on the NGS test results.  2.  Prostatic hypermetabolism: -PET scan on 04/09/2021 showed incidental left-sided prostatic hypermetabolic. - We will consider checking his PSA at some point.   Orders placed this encounter:  No orders of the defined types were placed in this encounter.    Derek Jack, MD [place] [phone]   I, Thana Ates, am acting as a scribe for Dr. Derek Jack.  I, Derek Jack MD, have reviewed the above documentation for accuracy and completeness, and I agree with the above.

## 2021-06-01 NOTE — Patient Instructions (Signed)
[place] at [facility] Discharge Instructions   You were seen and examined today by Dr. Delton Coombes.  Since you are unable to receive any further radiation treatments, we will do a special blood test and special testing on the biopsy done in 2021 to see what treatment options may be available to you.  It could be in the form of a pill to help control this cancer, or either immunotherapy infusions to control this tumor.  We will see you back in a few weeks to review the results of those tests and discuss your treatment options.    Thank you for choosing [place] at [facility] to provide your oncology and hematology care.  To afford each patient quality time with our provider, please arrive at least 15 minutes before your scheduled appointment time.   If you have a lab appointment with the [facility] please come in thru the Main Entrance and check in at the main information desk.  You need to re-schedule your appointment should you arrive 10 or more minutes late.  We strive to give you quality time with our providers, and arriving late affects you and other patients whose appointments are after yours.  Also, if you no show three or more times for appointments you may be dismissed from the clinic at the providers discretion.     Again, thank you for choosing [facility].  Our hope is that these requests will decrease the amount of time that you wait before being seen by our physicians.       _____________________________________________________________  Should you have questions after your visit to [facility], please contact our office at [phone] and follow the prompts.  Our office hours are 8:00 a.m. and 4:30 p.m. Monday - Friday.  Please note that voicemails left after 4:00 p.m. may not be returned until the following business day.  We are closed weekends and major holidays.  You do have access to a nurse 24-7,  just call the main number to the clinic [phone] and do not press any options, hold on the line and a nurse will answer the phone.    For prescription refill requests, have your pharmacy contact our office and allow 72 hours.    Due to Covid, you will need to wear a mask upon entering the hospital. If you do not have a mask, a mask will be given to you at the Main Entrance upon arrival. For doctor visits, patients may have 1 support person age 7 or older with them. For treatment visits, patients can not have anyone with them due to social distancing guidelines and our immunocompromised population.

## 2021-06-01 NOTE — Progress Notes (Signed)
Guardant 360 drawn per Dr. Delton Coombes from patient's R [facility] with 23G butterfly needle. Patient tolerated well and without difficulty. Patient discharged ambulatory and in satisfactory condition.  [employer] testing requested per Dr. Delton Coombes on [phone]

## 2021-06-01 NOTE — Addendum Note (Signed)
Encounter addended by: Ranell Patrick, RN on: 06/01/2021 2:09 PM  Actions taken: Clinical Note Signed, Delete clinical note

## 2021-06-07 DIAGNOSIS — I2510 Atherosclerotic heart disease of native coronary artery without angina pectoris: Secondary | ICD-10-CM | POA: Diagnosis not present

## 2021-06-07 DIAGNOSIS — J449 Chronic obstructive pulmonary disease, unspecified: Secondary | ICD-10-CM | POA: Diagnosis not present

## 2021-06-16 ENCOUNTER — Encounter (HOSPITAL_COMMUNITY): Payer: Self-pay

## 2021-06-23 ENCOUNTER — Other Ambulatory Visit: Payer: Self-pay | Admitting: Internal Medicine

## 2021-06-23 DIAGNOSIS — I10 Essential (primary) hypertension: Secondary | ICD-10-CM

## 2021-07-02 ENCOUNTER — Other Ambulatory Visit: Payer: Self-pay

## 2021-07-02 ENCOUNTER — Inpatient Hospital Stay (HOSPITAL_COMMUNITY): Payer: Medicare HMO | Attending: Hematology | Admitting: Hematology

## 2021-07-02 VITALS — BP 120/67 | HR 74 | Temp 97.0°F | Resp 18 | Ht 70.0 in | Wt 136.2 lb

## 2021-07-02 DIAGNOSIS — Z923 Personal history of irradiation: Secondary | ICD-10-CM | POA: Insufficient documentation

## 2021-07-02 DIAGNOSIS — N400 Enlarged prostate without lower urinary tract symptoms: Secondary | ICD-10-CM | POA: Insufficient documentation

## 2021-07-02 DIAGNOSIS — I2510 Atherosclerotic heart disease of native coronary artery without angina pectoris: Secondary | ICD-10-CM | POA: Insufficient documentation

## 2021-07-02 DIAGNOSIS — I700 Atherosclerosis of aorta: Secondary | ICD-10-CM | POA: Diagnosis not present

## 2021-07-02 DIAGNOSIS — I255 Ischemic cardiomyopathy: Secondary | ICD-10-CM | POA: Diagnosis not present

## 2021-07-02 DIAGNOSIS — Z800 Family history of malignant neoplasm of digestive organs: Secondary | ICD-10-CM | POA: Insufficient documentation

## 2021-07-02 DIAGNOSIS — F17210 Nicotine dependence, cigarettes, uncomplicated: Secondary | ICD-10-CM | POA: Insufficient documentation

## 2021-07-02 DIAGNOSIS — I252 Old myocardial infarction: Secondary | ICD-10-CM | POA: Diagnosis not present

## 2021-07-02 DIAGNOSIS — I10 Essential (primary) hypertension: Secondary | ICD-10-CM | POA: Insufficient documentation

## 2021-07-02 DIAGNOSIS — K219 Gastro-esophageal reflux disease without esophagitis: Secondary | ICD-10-CM | POA: Diagnosis not present

## 2021-07-02 DIAGNOSIS — E785 Hyperlipidemia, unspecified: Secondary | ICD-10-CM | POA: Diagnosis not present

## 2021-07-02 DIAGNOSIS — C3432 Malignant neoplasm of lower lobe, left bronchus or lung: Secondary | ICD-10-CM | POA: Diagnosis not present

## 2021-07-02 DIAGNOSIS — J439 Emphysema, unspecified: Secondary | ICD-10-CM | POA: Insufficient documentation

## 2021-07-02 DIAGNOSIS — C3492 Malignant neoplasm of unspecified part of left bronchus or lung: Secondary | ICD-10-CM

## 2021-07-02 DIAGNOSIS — Z87442 Personal history of urinary calculi: Secondary | ICD-10-CM | POA: Insufficient documentation

## 2021-07-02 LAB — PSA: Prostatic Specific Antigen: 6.86 ng/mL — ABNORMAL HIGH (ref 0.00–4.00)

## 2021-07-02 NOTE — Patient Instructions (Addendum)
[place] at [facility] ?Discharge Instructions ? ? ?You were seen and examined today by Dr. Delton Coombes. ? ?He reviewed the results of the special testing we sent on your old biopsy tissue.  This testing did not show any mutations to target. As already discussed, surgery and radiation are not treatment options. Immunotherapy and chemo pills are not an option based on this testing.  ? ? ? ? ?Thank you for choosing [place] at [facility] to provide your oncology and hematology care.  To afford each patient quality time with our provider, please arrive at least 15 minutes before your scheduled appointment time.  ? ?If you have a lab appointment with the [facility] please come in thru the Main Entrance and check in at the main information desk. ? ?You need to re-schedule your appointment should you arrive 10 or more minutes late.  We strive to give you quality time with our providers, and arriving late affects you and other patients whose appointments are after yours.  Also, if you no show three or more times for appointments you may be dismissed from the clinic at the providers discretion.     ?Again, thank you for choosing [facility].  Our hope is that these requests will decrease the amount of time that you wait before being seen by our physicians.       ?_____________________________________________________________ ? ?Should you have questions after your visit to [facility], please contact our office at [phone] and follow the prompts.  Our office hours are 8:00 a.m. and 4:30 p.m. Monday - Friday.  Please note that voicemails left after 4:00 p.m. may not be returned until the following business day.  We are closed weekends and major holidays.  You do have access to a nurse 24-7, just call the main number to the clinic [phone] and do not press any options, hold on the line and a nurse will answer the phone.   ? ?For  prescription refill requests, have your pharmacy contact our office and allow 72 hours.   ? ?Due to Covid, you will need to wear a mask upon entering the hospital. If you do not have a mask, a mask will be given to you at the Main Entrance upon arrival. For doctor visits, patients may have 1 support person age 68 or older with them. For treatment visits, patients can not have anyone with them due to social distancing guidelines and our immunocompromised population.  ? ?   ?

## 2021-07-02 NOTE — Progress Notes (Signed)
[address], [place] [address]   CLINIC:  Medical Oncology/Hematology  PCP:  Lysbeth Penner, [address] / [place] [address] [phone]   REASON FOR VISIT:  Follow-up for left squamous cell lung cancer  PRIOR THERAPY: Left lung SBRT 50 Gy in 5 fractions from 04/14/2020 to 04/24/2020  NGS Results: not done  CURRENT THERAPY: surveillance  BRIEF ONCOLOGIC HISTORY:  Oncology History  Squamous cell lung cancer, left ([place])  12/08/2016 Initial Diagnosis   Squamous cell lung cancer, left ([place])   11/12/2020 Imaging   1. LEFT infrahilar lesion of similar size compared to most recent study with evolving post treatment changes and diminished basilar nodularity that was seen previously. 2. Basilar nodularity discussed above likely post inflammatory or infectious, attention on follow-up. No new or progressive findings. 3. Diminishing size of mediastinal lymph nodes and particularly the dominant node anterior to the carina. 4. Three-vessel coronary artery disease. 5. Emphysema and aortic atherosclerosis.       CANCER STAGING:  Cancer Staging  No matching staging information was found for the patient.  INTERVAL HISTORY:  [name], a 86 y.o. male, returns for routine follow-up of his left squamous cell lung cancer. [name] Graham was last seen on 06/01/2021.   Today he reports feeling well. He uses his O2 at home when he is active, and he reports he does not use it when sleeping or resting. He reports slow flow while urinating and nocturia. He reports a cough productive of clear sputum, and he denies hemoptysis, hematuria, and hematochezia.   REVIEW OF SYSTEMS:  Review of Systems  Constitutional:  Positive for fatigue. Negative for appetite change.  Respiratory:  Positive for shortness of breath. Negative for hemoptysis.   Gastrointestinal:  Positive for constipation. Negative for blood in stool.  Genitourinary:  Positive for  difficulty urinating (slow flow) and nocturia. Negative for hematuria.   Neurological:  Positive for dizziness and numbness.  Psychiatric/Behavioral:  Positive for sleep disturbance.   All other systems reviewed and are negative.  PAST MEDICAL/SURGICAL HISTORY:  Past Medical History:  Diagnosis Date   Arthritis    COPD (chronic obstructive pulmonary disease) ([place])    Coronary atherosclerosis of native coronary artery    BMS circ and PTCA PDA 2007, DES RCA 10/09, DES LAD 2/12   Essential hypertension    GERD (gastroesophageal reflux disease)    Hearing loss    Heart attack ([place])    x 3 (latest 2009)   History of kidney stones    Hyperlipidemia    Ischemic cardiomyopathy    NSTEMI (non-ST elevated myocardial infarction) ([place])    2007   Squamous cell lung cancer [facility])    Past Surgical History:  Procedure Laterality Date   ANGIOPLASTY     stent   BRONCHIAL BIOPSY  02/19/2020   Procedure: BRONCHIAL BIOPSIES;  Surgeon: Collene Gobble, MD;  Location: [facility];  Service: Pulmonary;;   BRONCHIAL BRUSHINGS  02/19/2020   Procedure: BRONCHIAL BRUSHINGS;  Surgeon: Collene Gobble, MD;  Location: [place];  Service: Pulmonary;;   BRONCHIAL NEEDLE ASPIRATION BIOPSY  02/19/2020   Procedure: BRONCHIAL NEEDLE ASPIRATION BIOPSIES;  Surgeon: Collene Gobble, MD;  Location: [facility];  Service: Pulmonary;;   COLONOSCOPY     FIDUCIAL MARKER PLACEMENT  02/19/2020   Procedure: FIDUCIAL MARKER PLACEMENT;  Surgeon: Collene Gobble, MD;  Location: [facility];  Service: Pulmonary;;   HERNIA REPAIR  LIPOMA EXCISION Right    hip/buttox   TONSILLECTOMY     as a child   VIDEO BRONCHOSCOPY WITH ENDOBRONCHIAL NAVIGATION N/A 02/19/2020   Procedure: VIDEO BRONCHOSCOPY WITH ENDOBRONCHIAL NAVIGATION;  Surgeon: Collene Gobble, MD;  Location: [facility];  Service: Pulmonary;  Laterality: N/A;    SOCIAL HISTORY:  Social History   Socioeconomic History   Marital status: Divorced     Spouse name: Not on file   Number of children: Not on file   Years of education: Not on file   Highest education level: Not on file  Occupational History   Occupation: Retired  Tobacco Use   Smoking status: Every Day    Packs/day: 0.50    Years: 63.00    Pack years: 31.50    Types: Cigarettes    Start date: 06/15/1951   Smokeless tobacco: Never   Tobacco comments:    4 cigarettes smoked daily 03/05/20 [facility]J   Vaping Use   Vaping Use: Never used  Substance and Sexual Activity   Alcohol use: No    Alcohol/week: 0.0 standard drinks   Drug use: No   Sexual activity: Not Currently  Other Topics Concern   Not on file  Social History Narrative   Not on file   Social Determinants of Health   Financial Resource Strain: Not on file  Food Insecurity: Not on file  Transportation Needs: Not on file  Physical Activity: Not on file  Stress: Not on file  Social Connections: Not on file  Intimate Partner Violence: Not on file    FAMILY HISTORY:  Family History  Problem Relation Age of Onset   Throat cancer Father    Heart attack Mother    Alzheimer's disease Mother     CURRENT MEDICATIONS:  Current Outpatient Medications  Medication Sig Dispense Refill   albuterol (VENTOLIN HFA) 108 (90 Base) MCG/ACT inhaler Inhale into the lungs every 6 (six) hours as needed for wheezing or shortness of breath.     amLODipine (NORVASC) 5 MG tablet Take 1 tablet (5 mg total) by mouth daily. 90 tablet 3   aspirin 325 MG tablet Take by mouth.     clopidogrel (PLAVIX) 75 MG tablet TAKE 1 TABLET BY MOUTH ONCE DAILY . APPOINTMENT REQUIRED FOR FUTURE REFILLS 90 tablet 3   cyclobenzaprine (FLEXERIL) 5 MG tablet Take 1 tablet (5 mg total) by mouth 3 (three) times daily as needed for muscle spasms. 90 tablet 0   Ensure (ENSURE) Take 237 mLs by mouth 2 (two) times daily.     fluticasone (FLONASE) 50 MCG/ACT nasal spray Place into both nostrils 3 (three) times daily as needed for allergies or rhinitis.      HYDROcodone-acetaminophen (NORCO) 7.5-325 MG tablet Take 1 tablet by mouth daily as needed.     metoprolol succinate (TOPROL XL) 25 MG 24 hr tablet Take 1 tablet (25 mg total) by mouth daily. 90 tablet 3   Naphazoline HCl (CLEAR EYES OP) Place 1 drop into both eyes daily as needed (irritation / redness).     nitroGLYCERIN (NITROSTAT) 0.4 MG SL tablet DISSOLVE ONE TABLET UNDER THE TONGUE EVERY 5 MINUTES AS NEEDED FOR CHEST PAIN.  DO NOT EXCEED A TOTAL OF 3 DOSES IN 15 MINUTES 25 tablet 3   olmesartan (BENICAR) 40 MG tablet Take 1 tablet by mouth once daily 30 tablet 0   OXYGEN Inhale into the lungs.     pantoprazole (PROTONIX) 40 MG tablet Take by mouth.     simvastatin (  ZOCOR) 80 MG tablet TAKE 1 TABLET BY MOUTH AT BEDTIME 90 tablet 3   Tiotropium Bromide Monohydrate (SPIRIVA RESPIMAT) 2.5 MCG/ACT AERS Inhale 2 puffs into the lungs daily. 4 g 11   tiZANidine (ZANAFLEX) 4 MG tablet Take by mouth.     umeclidinium bromide (INCRUSE ELLIPTA) 62.5 MCG/INH AEPB Inhale 1 puff into the lungs daily. 30 each 11   No current facility-administered medications for this visit.    ALLERGIES:  Allergies  Allergen Reactions   Entresto [Sacubitril-Valsartan] Itching    PHYSICAL EXAM:  Performance status (ECOG): 2 - Symptomatic, <50% confined to bed  Vitals:   07/02/21 1012  BP: 120/67  Pulse: 74  Resp: 18  Temp: (!) 97 F (36.1 C)  SpO2: 99%   Wt Readings from Last 3 Encounters:  07/02/21 136 lb 3.2 oz (61.8 kg)  06/01/21 137 lb 11.2 oz (62.5 kg)  05/20/21 136 lb 2 oz (61.7 kg)   Physical Exam Vitals reviewed.  Constitutional:      Appearance: Normal appearance.  Cardiovascular:     Rate and Rhythm: Normal rate and regular rhythm.     Pulses: Normal pulses.     Heart sounds: Normal heart sounds.  Pulmonary:     Effort: Pulmonary effort is normal.     Breath sounds: Normal breath sounds.  Neurological:     General: No focal deficit present.     Mental Status: He is alert and oriented  to person, place, and time.  Psychiatric:        Mood and Affect: Mood normal.        Behavior: Behavior normal.     LABORATORY DATA:  I have reviewed the labs as listed.  CBC Latest Ref Rng & Units 03/18/2021 11/12/2020 07/09/2020  WBC 4.0 - 10.5 K/uL 6.8 7.3 5.4  Hemoglobin 13.0 - 17.0 g/dL 15.5 16.4 15.5  Hematocrit 39.0 - 52.0 % 45.7 48.1 46.1  Platelets 150 - 400 K/uL 209 188 196   CMP Latest Ref Rng & Units 03/18/2021 11/12/2020 07/09/2020  Glucose 70 - 99 mg/dL 97 103(H) 122(H)  BUN 8 - 23 mg/dL _0 Creatinine 0.61 - 1.24 mg/dL 1.02 1.08 1.04  Sodium 135 - 145 mmol/L 136 135 138  Potassium 3.5 - 5.1 mmol/L 4.1 4.5 4.2  Chloride 98 - 111 mmol/L 106 102 106  CO2 22 - 32 mmol/L _1 Calcium 8.9 - 10.3 mg/dL 8.9 9.2 9.0  Total Protein 6.5 - 8.1 g/dL 7.4 7.5 7.1  Total Bilirubin 0.3 - 1.2 mg/dL 0.6 0.9 0.7  Alkaline Phos 38 - 126 U/L 56 53 51  AST 15 - 41 U/L _2 ALT 0 - 44 U/L _3 DIAGNOSTIC IMAGING:  I have independently reviewed the scans and discussed with the patient. No results found.   ASSESSMENT:  1.  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 -Patient placed on O2 nasal cannula in the last 3 weeks for drop in sats on exertion. -PET scan on 01/28/2020 shows 2.6 cm left lower lobe nodule SUV 10.1.  Focal hypermetabolic them in the left hilum identified without discrete lymph node visible on CT.  10 mm short axis precarinal lymph node with low-level SUV 2.9.  7 mm short axis right hilar node with SUV 3.8. -Navigational bronchoscopy and biopsy of the left lower lobe  lung lesion consistent with squamous cell carcinoma. -IMRT to the left lower lung mass from 04/14/2020 through 04/24/2020, 50 Gy/ 5 fractions. - NGS testing on sample from 02/19/2020: Negative for BRAF V600 E.  MMR proficient.  PD-L1 22 C3, 28-8 and SP142 0%.  TRK A/B/C-.   2.  Social/family  history: -2 pack/day smoker for 68 years.  Currently smokes 4 cigarettes/day. 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 -Father had throat cancer.   PLAN:  1.  Clinical T1c N0 M0 squamous cell carcinoma of left lung lower lobe: - PET scan on 04/09/2021 showed left lower lobe lung mass measuring 2.1 x 2.2 cm, SUV 5.7.  Hilar hypermetabolism without well-defined adenopathy was again noted, similar to pretreatment PET scan. - He was evaluated by Dr. Sondra Come.  He was not a candidate for SBRT or conventional radiation. - We have reviewed NGS testing on lung biopsy from 02/19/2020 which did not show any targetable mutations.  Unfortunately all the testing could not be done due to insufficient tumor tissue. - I have recommended repeating a PET CT scan. - I have recommended Guardant360 testing. - We will follow-up with him in 4 weeks.  2.  Prostatic hypermetabolism: - PET scan on 04/09/2021 showed incidental left-sided prostate hypermetabolism. - We have sent a PSA level today.  If it is elevated, will consider referral to urology.   Orders placed this encounter:  No orders of the defined types were placed in this encounter.    Derek Jack, MD [place] [phone]   I, Thana Ates, am acting as a scribe for Dr. Derek Jack.  I, Derek Jack MD, have reviewed the above documentation for accuracy and completeness, and I agree with the above.

## 2021-07-02 NOTE — Progress Notes (Signed)
Guardant 360 sent per Dr. Delton Coombes ?

## 2021-07-05 DIAGNOSIS — I2510 Atherosclerotic heart disease of native coronary artery without angina pectoris: Secondary | ICD-10-CM | POA: Diagnosis not present

## 2021-07-05 DIAGNOSIS — J449 Chronic obstructive pulmonary disease, unspecified: Secondary | ICD-10-CM | POA: Diagnosis not present

## 2021-07-08 DIAGNOSIS — C3492 Malignant neoplasm of unspecified part of left bronchus or lung: Secondary | ICD-10-CM | POA: Diagnosis not present

## 2021-07-15 DIAGNOSIS — C3492 Malignant neoplasm of unspecified part of left bronchus or lung: Secondary | ICD-10-CM | POA: Diagnosis not present

## 2021-07-15 DIAGNOSIS — R0602 Shortness of breath: Secondary | ICD-10-CM | POA: Diagnosis not present

## 2021-07-15 DIAGNOSIS — J449 Chronic obstructive pulmonary disease, unspecified: Secondary | ICD-10-CM | POA: Diagnosis not present

## 2021-07-20 ENCOUNTER — Other Ambulatory Visit: Payer: Self-pay

## 2021-07-20 DIAGNOSIS — I10 Essential (primary) hypertension: Secondary | ICD-10-CM

## 2021-07-20 MED ORDER — OLMESARTAN MEDOXOMIL 40 MG PO TABS: 40.0000 mg | ORAL_TABLET | Freq: Every day | ORAL | 6 refills | Status: DC

## 2021-07-20 NOTE — Telephone Encounter (Signed)
Refilled Benicar 40 mg qd, #30 RF 6 to [facility] ?

## 2021-07-23 ENCOUNTER — Ambulatory Visit (HOSPITAL_COMMUNITY)
Admission: RE | Admit: 2021-07-23 | Discharge: 2021-07-23 | Disposition: A | Discharge status: Home / Self Care | Payer: Medicare HMO | Source: Ambulatory Visit | Attending: Hematology | Admitting: Hematology

## 2021-07-23 ENCOUNTER — Other Ambulatory Visit: Payer: Self-pay

## 2021-07-23 DIAGNOSIS — C3492 Malignant neoplasm of unspecified part of left bronchus or lung: Secondary | ICD-10-CM | POA: Diagnosis not present

## 2021-07-23 DIAGNOSIS — C3490 Malignant neoplasm of unspecified part of unspecified bronchus or lung: Secondary | ICD-10-CM | POA: Diagnosis not present

## 2021-07-23 DIAGNOSIS — R918 Other nonspecific abnormal finding of lung field: Secondary | ICD-10-CM | POA: Diagnosis not present

## 2021-07-23 MED ORDER — FLUDEOXYGLUCOSE F - 18 (FDG) INJECTION
6.7200 | Freq: Once | INTRAVENOUS | Status: AC | PRN
  Administered 2021-07-23: 6.72 via INTRAVENOUS

## 2021-07-30 ENCOUNTER — Inpatient Hospital Stay (HOSPITAL_BASED_OUTPATIENT_CLINIC_OR_DEPARTMENT_OTHER): Payer: Medicare HMO | Admitting: Hematology

## 2021-07-30 VITALS — BP 119/70 | HR 70 | Temp 97.4°F | Resp 18 | Ht 70.0 in | Wt 132.3 lb

## 2021-07-30 DIAGNOSIS — E785 Hyperlipidemia, unspecified: Secondary | ICD-10-CM | POA: Diagnosis not present

## 2021-07-30 DIAGNOSIS — I2510 Atherosclerotic heart disease of native coronary artery without angina pectoris: Secondary | ICD-10-CM | POA: Diagnosis not present

## 2021-07-30 DIAGNOSIS — C3492 Malignant neoplasm of unspecified part of left bronchus or lung: Secondary | ICD-10-CM | POA: Diagnosis not present

## 2021-07-30 DIAGNOSIS — C3432 Malignant neoplasm of lower lobe, left bronchus or lung: Secondary | ICD-10-CM | POA: Diagnosis not present

## 2021-07-30 DIAGNOSIS — J439 Emphysema, unspecified: Secondary | ICD-10-CM | POA: Diagnosis not present

## 2021-07-30 DIAGNOSIS — I255 Ischemic cardiomyopathy: Secondary | ICD-10-CM | POA: Diagnosis not present

## 2021-07-30 DIAGNOSIS — N400 Enlarged prostate without lower urinary tract symptoms: Secondary | ICD-10-CM | POA: Diagnosis not present

## 2021-07-30 DIAGNOSIS — I10 Essential (primary) hypertension: Secondary | ICD-10-CM | POA: Diagnosis not present

## 2021-07-30 DIAGNOSIS — Z800 Family history of malignant neoplasm of digestive organs: Secondary | ICD-10-CM | POA: Diagnosis not present

## 2021-07-30 DIAGNOSIS — K219 Gastro-esophageal reflux disease without esophagitis: Secondary | ICD-10-CM | POA: Diagnosis not present

## 2021-07-30 DIAGNOSIS — F17210 Nicotine dependence, cigarettes, uncomplicated: Secondary | ICD-10-CM | POA: Diagnosis not present

## 2021-07-30 DIAGNOSIS — I252 Old myocardial infarction: Secondary | ICD-10-CM | POA: Diagnosis not present

## 2021-07-30 DIAGNOSIS — Z923 Personal history of irradiation: Secondary | ICD-10-CM | POA: Diagnosis not present

## 2021-07-30 DIAGNOSIS — Z87442 Personal history of urinary calculi: Secondary | ICD-10-CM | POA: Diagnosis not present

## 2021-07-30 DIAGNOSIS — I700 Atherosclerosis of aorta: Secondary | ICD-10-CM | POA: Diagnosis not present

## 2021-07-30 NOTE — Patient Instructions (Signed)
[place] at [facility] ?Discharge Instructions ? ? ?You were seen and examined today by Dr. Delton Coombes. ? ?The special blood test we sent did not show any treatment options. ? ?The PET scan shows that the lung nodule is stable. ? ?We will continue to monitor this area with CT scans.  ? ?Return as scheduled in 4 months.  ? ? ?Thank you for choosing [place] at [facility] to provide your oncology and hematology care.  To afford each patient quality time with our provider, please arrive at least 15 minutes before your scheduled appointment time.  ? ?If you have a lab appointment with the [facility] please come in thru the Main Entrance and check in at the main information desk. ? ?You need to re-schedule your appointment should you arrive 10 or more minutes late.  We strive to give you quality time with our providers, and arriving late affects you and other patients whose appointments are after yours.  Also, if you no show three or more times for appointments you may be dismissed from the clinic at the providers discretion.     ?Again, thank you for choosing [facility].  Our hope is that these requests will decrease the amount of time that you wait before being seen by our physicians.       ?_____________________________________________________________ ? ?Should you have questions after your visit to [facility], please contact our office at [phone] and follow the prompts.  Our office hours are 8:00 a.m. and 4:30 p.m. Monday - Friday.  Please note that voicemails left after 4:00 p.m. may not be returned until the following business day.  We are closed weekends and major holidays.  You do have access to a nurse 24-7, just call the main number to the clinic [phone] and do not press any options, hold on the line and a nurse will answer the phone.   ? ?For prescription refill requests, have your pharmacy contact our office and  allow 72 hours.   ? ?Due to Covid, you will need to wear a mask upon entering the hospital. If you do not have a mask, a mask will be given to you at the Main Entrance upon arrival. For doctor visits, patients may have 1 support person age 58 or older with them. For treatment visits, patients can not have anyone with them due to social distancing guidelines and our immunocompromised population.  ? ?   ?

## 2021-07-30 NOTE — Progress Notes (Signed)
? ?[facility] ?[address] ?[place], [place] [address] ? ? ?CLINIC:  ?Medical Oncology/Hematology ? ?PCP:  ?Lysbeth Penner, [place] ?[address] [place] / [place] [address] ?[phone] ? ? ?REASON FOR VISIT:  ?Follow-up for left squamous cell lung cancer ? ?PRIOR THERAPY: Left lung SBRT 50 Gy in 5 fractions from 04/14/2020 to 04/24/2020 ? ?NGS Results: not done ? ?CURRENT THERAPY: surveillance ? ?BRIEF ONCOLOGIC HISTORY:  ?Oncology History  ?Squamous cell lung cancer, left ([place])  ?12/08/2016 Initial Diagnosis  ? Squamous cell lung cancer, left ([place]) ?  ?11/12/2020 Imaging  ? 1. LEFT infrahilar lesion of similar size compared to most recent study with evolving post treatment changes and diminished basilar nodularity that was seen previously. ?2. Basilar nodularity discussed above likely post inflammatory or infectious, attention on follow-up. No new or progressive findings. ?3. Diminishing size of mediastinal lymph nodes and particularly the dominant node anterior to the carina. ?4. Three-vessel coronary artery disease. ?5. Emphysema and aortic atherosclerosis. ?  ?  ? ? ?CANCER STAGING: ? Cancer Staging  ?No matching staging information was found for the patient. ? ?INTERVAL HISTORY:  ?[name] Graham, a 86 y.o. male, returns for routine follow-up of his left squamous cell lung cancer. [name] Graham was last seen on 07/02/2021.  ? ?Today he reports feeling good. He reports a cough which he attributes to sinus drainage running down his throat.  ? ?REVIEW OF SYSTEMS:  ?Review of Systems  ?Constitutional:  Negative for appetite change and fatigue.  ?Respiratory:  Positive for cough and shortness of breath.   ?Gastrointestinal:  Positive for constipation.  ?All other systems reviewed and are negative. ? ?PAST MEDICAL/SURGICAL HISTORY:  ?Past Medical History:  ?Diagnosis Date  ? Arthritis   ? COPD (chronic obstructive pulmonary disease) ([place])   ? Coronary atherosclerosis of native coronary artery   ? BMS circ  and PTCA PDA 2007, DES RCA 10/09, DES LAD 2/12  ? Essential hypertension   ? GERD (gastroesophageal reflux disease)   ? Hearing loss   ? Heart attack ([place])   ? x 3 (latest 2009)  ? History of kidney stones   ? Hyperlipidemia   ? Ischemic cardiomyopathy   ? NSTEMI (non-ST elevated myocardial infarction) ([facility])   ? 2007  ? Squamous cell lung cancer ([place])   ? ?Past Surgical History:  ?Procedure Laterality Date  ? ANGIOPLASTY    ? stent  ? BRONCHIAL BIOPSY  02/19/2020  ? Procedure: BRONCHIAL BIOPSIES;  Surgeon: Collene Gobble, MD;  Location: [facility] ENDOSCOPY;  Service: Pulmonary;;  ? BRONCHIAL BRUSHINGS  02/19/2020  ? Procedure: BRONCHIAL BRUSHINGS;  Surgeon: Collene Gobble, MD;  Location: [facility] ENDOSCOPY;  Service: Pulmonary;;  ? BRONCHIAL NEEDLE ASPIRATION BIOPSY  02/19/2020  ? Procedure: BRONCHIAL NEEDLE ASPIRATION BIOPSIES;  Surgeon: Collene Gobble, MD;  Location: [facility] ENDOSCOPY;  Service: Pulmonary;;  ? COLONOSCOPY    ? FIDUCIAL MARKER PLACEMENT  02/19/2020  ? Procedure: FIDUCIAL MARKER PLACEMENT;  Surgeon: Collene Gobble, MD;  Location: [facility] ENDOSCOPY;  Service: Pulmonary;;  ? HERNIA REPAIR    ? LIPOMA EXCISION Right   ? hip/buttox  ? TONSILLECTOMY    ? as a child  ? VIDEO BRONCHOSCOPY WITH ENDOBRONCHIAL NAVIGATION N/A 02/19/2020  ? Procedure: VIDEO BRONCHOSCOPY WITH ENDOBRONCHIAL NAVIGATION;  Surgeon: Collene Gobble, MD;  Location: [facility] ENDOSCOPY;  Service: Pulmonary;  Laterality: N/A;  ? ? ?SOCIAL HISTORY:  ?Social History  ? ?Socioeconomic History  ? Marital status: Divorced  ?  Spouse name: Not on file  ? Number of children: Not on file  ? Years of education: Not on file  ? Highest education level: Not on file  ?Occupational History  ? Occupation: Retired  ?Tobacco Use  ? Smoking status: Every Day  ?  Packs/day: 0.50  ?  Years: 63.00  ?  Pack years: 31.50  ?  Types: Cigarettes  ?  Start date: 06/15/1953  ? Smokeless tobacco: Never  ? Tobacco comments:  ?  4 cigarettes smoked daily 03/05/20 [facility]   ?Vaping Use  ? Vaping  Use: Never used  ?Substance and Sexual Activity  ? Alcohol use: No  ?  Alcohol/week: 0.0 standard drinks  ? Drug use: No  ? Sexual activity: Not Currently  ?Other Topics Concern  ? Not on file  ?Social History Narrative  ? Not on file  ? ?Social Determinants of Health  ? ?Financial Resource Strain: Not on file  ?Food Insecurity: Not on file  ?Transportation Needs: Not on file  ?Physical Activity: Not on file  ?Stress: Not on file  ?Social Connections: Not on file  ?Intimate Partner Violence: Not on file  ? ? ?FAMILY HISTORY:  ?Family History  ?Problem Relation Age of Onset  ? Throat cancer Father   ? Heart attack Mother   ? Alzheimer's disease Mother   ? ? ?CURRENT MEDICATIONS:  ?Current Outpatient Medications  ?Medication Sig Dispense Refill  ? albuterol (VENTOLIN HFA) 108 (90 Base) MCG/ACT inhaler Inhale into the lungs every 6 (six) hours as needed for wheezing or shortness of breath.    ? amLODipine (NORVASC) 5 MG tablet Take 1 tablet (5 mg total) by mouth daily. 90 tablet 3  ? aspirin 325 MG tablet Take by mouth.    ? clopidogrel (PLAVIX) 75 MG tablet TAKE 1 TABLET BY MOUTH ONCE DAILY . APPOINTMENT REQUIRED FOR FUTURE REFILLS 90 tablet 3  ? cyclobenzaprine (FLEXERIL) 5 MG tablet Take 1 tablet (5 mg total) by mouth 3 (three) times daily as needed for muscle spasms. 90 tablet 0  ? Ensure (ENSURE) Take 237 mLs by mouth 2 (two) times daily.    ? fluticasone (FLONASE) 50 MCG/ACT nasal spray Place into both nostrils 3 (three) times daily as needed for allergies or rhinitis.    ? HYDROcodone-acetaminophen (NORCO) 7.5-325 MG tablet Take 1 tablet by mouth daily as needed.    ? metoprolol succinate (TOPROL XL) 25 MG 24 hr tablet Take 1 tablet (25 mg total) by mouth daily. 90 tablet 3  ? Naphazoline HCl (CLEAR EYES OP) Place 1 drop into both eyes daily as needed (irritation / redness).    ? nitroGLYCERIN (NITROSTAT) 0.4 MG SL tablet DISSOLVE ONE TABLET UNDER THE TONGUE EVERY 5 MINUTES AS NEEDED FOR CHEST PAIN.  DO NOT  EXCEED A TOTAL OF 3 DOSES IN 15 MINUTES 25 tablet 3  ? olmesartan (BENICAR) 40 MG tablet Take 1 tablet (40 mg total) by mouth daily. 30 tablet 6  ? OXYGEN Inhale into the lungs.    ? pantoprazole (PROTONIX) 40 MG tablet Take by mouth.    ? simvastatin (ZOCOR) 80 MG tablet TAKE 1 TABLET BY MOUTH AT BEDTIME 90 tablet 3  ? Tiotropium Bromide Monohydrate (SPIRIVA RESPIMAT) 2.5 MCG/ACT AERS Inhale 2 puffs into the lungs daily. 4 g 11  ? tiZANidine (ZANAFLEX) 4 MG tablet Take by mouth.    ? umeclidinium bromide (INCRUSE ELLIPTA) 62.5 MCG/INH AEPB Inhale 1 puff into the lungs daily. 30 each 11  ? ?No  current facility-administered medications for this visit.  ? ? ?ALLERGIES:  ?Allergies  ?Allergen Reactions  ? Entresto [Sacubitril-Valsartan] Itching  ? ? ?PHYSICAL EXAM:  ?Performance status (ECOG): 2 - Symptomatic, <50% confined to bed ? ?Vitals:  ? 07/30/21 1047  ?BP: 119/70  ?Pulse: 70  ?Resp: 18  ?Temp: (!) 97.4 ?F (36.3 ?C)  ?SpO2: 98%  ? ?Wt Readings from Last 3 Encounters:  ?07/30/21 132 lb 4.4 oz (60 kg)  ?07/02/21 136 lb 3.2 oz (61.8 kg)  ?06/01/21 137 lb 11.2 oz (62.5 kg)  ? ?Physical Exam ?Vitals reviewed.  ?Constitutional:   ?   Appearance: Normal appearance.  ?Cardiovascular:  ?   Rate and Rhythm: Normal rate and regular rhythm.  ?   Pulses: Normal pulses.  ?   Heart sounds: Normal heart sounds.  ?Pulmonary:  ?   Effort: Pulmonary effort is normal.  ?   Breath sounds: Normal breath sounds.  ?Neurological:  ?   General: No focal deficit present.  ?   Mental Status: He is alert and oriented to person, place, and time.  ?Psychiatric:     ?   Mood and Affect: Mood normal.     ?   Behavior: Behavior normal.  ?  ? ?LABORATORY DATA:  ?I have reviewed the labs as listed.  ? ?  Latest Ref Rng & Units 03/18/2021  ? 11:51 AM 11/12/2020  ? 10:07 AM 07/09/2020  ?  9:44 AM  ?CBC  ?WBC 4.0 - 10.5 K/uL 6.8   7.3   5.4    ?Hemoglobin 13.0 - 17.0 g/dL 15.5   16.4   15.5    ?Hematocrit 39.0 - 52.0 % 45.7   48.1   46.1    ?Platelets  150 - 400 K/uL 209   188   196    ? ? ?  Latest Ref Rng & Units 03/18/2021  ? 11:51 AM 11/12/2020  ? 10:07 AM 07/09/2020  ?  9:44 AM  ?CMP  ?Glucose 70 - 99 mg/dL 97   103   122    ?BUN 8 - 23 mg/dL 22   15   20

## 2021-07-31 ENCOUNTER — Telehealth: Payer: Self-pay | Admitting: Internal Medicine

## 2021-07-31 NOTE — Telephone Encounter (Signed)
I called the patient and he wants to come in the office to switch to a different inhaler. He had a scan done by his cancer doctor and they told him to get checked out to see if he needs a different inhaler. I have made a follow up with the provider. Nothing further needed.  ?

## 2021-08-07 ENCOUNTER — Ambulatory Visit: Payer: Medicare HMO | Admitting: Nurse Practitioner

## 2021-08-08 DIAGNOSIS — Z20828 Contact with and (suspected) exposure to other viral communicable diseases: Secondary | ICD-10-CM | POA: Diagnosis not present

## 2021-08-14 ENCOUNTER — Other Ambulatory Visit (HOSPITAL_COMMUNITY): Payer: Medicare HMO

## 2021-08-15 DIAGNOSIS — R0602 Shortness of breath: Secondary | ICD-10-CM | POA: Diagnosis not present

## 2021-08-15 DIAGNOSIS — J449 Chronic obstructive pulmonary disease, unspecified: Secondary | ICD-10-CM | POA: Diagnosis not present

## 2021-08-15 DIAGNOSIS — C3492 Malignant neoplasm of unspecified part of left bronchus or lung: Secondary | ICD-10-CM | POA: Diagnosis not present

## 2021-08-20 ENCOUNTER — Ambulatory Visit (HOSPITAL_COMMUNITY): Payer: Medicare HMO | Admitting: Hematology

## 2021-08-25 ENCOUNTER — Ambulatory Visit: Payer: Medicare HMO | Admitting: Internal Medicine

## 2021-08-25 DIAGNOSIS — M47816 Spondylosis without myelopathy or radiculopathy, lumbar region: Secondary | ICD-10-CM | POA: Diagnosis not present

## 2021-08-25 DIAGNOSIS — Z79891 Long term (current) use of opiate analgesic: Secondary | ICD-10-CM | POA: Diagnosis not present

## 2021-08-25 DIAGNOSIS — M5136 Other intervertebral disc degeneration, lumbar region: Secondary | ICD-10-CM | POA: Diagnosis not present

## 2021-08-25 DIAGNOSIS — G894 Chronic pain syndrome: Secondary | ICD-10-CM | POA: Diagnosis not present

## 2021-08-25 DIAGNOSIS — M48061 Spinal stenosis, lumbar region without neurogenic claudication: Secondary | ICD-10-CM | POA: Diagnosis not present

## 2021-08-25 DIAGNOSIS — M4726 Other spondylosis with radiculopathy, lumbar region: Secondary | ICD-10-CM | POA: Diagnosis not present

## 2021-08-26 ENCOUNTER — Encounter: Payer: Self-pay | Admitting: Internal Medicine

## 2021-08-26 ENCOUNTER — Ambulatory Visit (INDEPENDENT_AMBULATORY_CARE_PROVIDER_SITE_OTHER): Payer: Medicare HMO | Admitting: Internal Medicine

## 2021-08-26 DIAGNOSIS — F17210 Nicotine dependence, cigarettes, uncomplicated: Secondary | ICD-10-CM | POA: Diagnosis not present

## 2021-08-26 DIAGNOSIS — C3492 Malignant neoplasm of unspecified part of left bronchus or lung: Secondary | ICD-10-CM

## 2021-08-26 DIAGNOSIS — J449 Chronic obstructive pulmonary disease, unspecified: Secondary | ICD-10-CM | POA: Diagnosis not present
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
## 2021-08-26 NOTE — Patient Instructions (Signed)
We will walk you today to see what your oxygen needs are ? ?Make sure you check your oxygen saturation  AT  your highest level of activity (not after you stop)   to be sure it stays over 90% and adjust  02 flow upward to maintain this level if needed but remember to turn it back to previous settings when you stop (to conserve your supply).  ? ? ?Only use your albuterol as a rescue medication to be used if you can't catch your breath by resting or doing a relaxed purse lip breathing pattern.  ?- The less you use it, the better it will work when you need it. ?- Ok to use up to 2 puffs  every 4 hours if you must but call for immediate appointment if use goes up over your usual need ?- Don't leave home without it !!  (think of it like the spare tire for your car)  ? ?Ok to try albuterol 15 min before an activity (on alternating days)  that you know would usually make you short of breath and see if it makes any difference and if makes none then don't take albuterol after activity unless you can't catch your breath as this means it's the resting that helps, not the albuterol. ?    ? ?Please schedule a follow up office visit in 4 weeks, sooner if needed - you must bring your medications and your list from your insurance company (called a formulary)  - stiolto is the  best bet  ?

## 2021-08-27 ENCOUNTER — Encounter: Payer: Self-pay | Admitting: Internal Medicine

## 2021-08-27 NOTE — Assessment & Plan Note (Signed)
4-5 min discussion re active cigarette smoking in addition to office E&M ? ?Ask about tobacco use:   Ongoing  ?Advise quitting   I took an extended  opportunity with this patient to outline the consequences of continued cigarette use  in airway disorders based on all the data we have from the multiple national lung health studies (perfomed over decades at millions of dollars in cost)  indicating that smoking cessation, not choice of inhalers or physicians, is the most important aspect of his care.   ?(used analogy of dirty air into carburetor for him > "can't expect the engine to run well regardless of what gas (inhaler) you use" ?Assess willingness:  Not committed at this point ?Assist in quit attempt:  Per PCP when ready ?Arrange follow up:   Follow up per Primary Care planned  ?  ?  ?  ?

## 2021-08-27 NOTE — Assessment & Plan Note (Addendum)
Active smoker ?Spirometry 03/23/2017  FEV1 1.68 (58%)  Ratio 60 with mild curvature s rx prior   ?- PFT's  07/26/2017  FEV1 1.97 (73 % ) ratio 62  p 13 % improvement from saba p nothing prior to study with DLCO  50 % corrects to 59 % for alv volume. ?- try off zestoril 01/17/2020  ? ?- 08/26/2021   Walked on RA  x  2  lap(s) =  approx 500  ft  @ avg pace, stopped due to sob with lowest 02 sats 98% (typical of a "pink puffer"  ? ?- The proper method of use, as well as anticipated side effects, of a metered-dose inhaler were discussed and demonstrated to the patient using teach back method using an empty cannister of symbicort ? ?Pt is Group B in terms of symptom/risk and laba/lama therefore appropriate rx at this point >>>  Bevespi stiolto or anoro all options but each has to be taught / verified in this pt with poor insight into how/ when to use meds so rec  ? ?>>> RTC with formulary/ [facility] office and just use saba prn as follows ? ?Re SABA :  I spent extra time with pt today reviewing appropriate use of albuterol for prn use on exertion with the following points: ?1) saba is for relief of sob that does not improve by walking a slower pace or resting but rather if the pt does not improve after trying this first. ?2) If the pt is convinced, as many are, that saba helps recover from activity faster then it's easy to tell if this is the case by re-challenging : ie stop, take the inhaler, then p 5 minutes try the exact same activity (intensity of workload) that just caused the symptoms and see if they are substantially diminished or not after saba ?3) if there is an activity that reproducibly causes the symptoms, try the saba 15 min before the activity on alternate days  ? ?If in fact the saba really does help, then fine to continue to use it prn but advised may need to look closer at the maintenance regimen being used to achieve better control of airways disease with exertion.  ? ?Each maintenance medication was  reviewed in detail including emphasizing most importantly the difference between maintenance and prns and under what circumstances the prns are to be triggered using an action plan format where appropriate. ? ?Total time for H and P, chart review, counseling, reviewing hfa device(s) , directly observing portions of ambulatory 02 saturation study/ and generating customized AVS unique to this office visit / same day charting >30 min to re-establish with me after 2 years absence  ?     ?  ?      ? ?  ? ?  ?

## 2021-08-27 NOTE — Assessment & Plan Note (Signed)
See CT neck 11/13/16 x 4 mm RUL  ?- Repeat 07/26/2017   No change RUL = perifissural nodule on LN > rec repeat in 12 m > placed in reminder file as he is moderate risk from smoking  ?- CT 01/10/20  LLL 2.4 cm spiculated nodule - nothing in RUL  ?- PET 01/29/2020 >>> Markedly hypermetabolic infrahilar left lower lobe pulmonary nodule ?consistent with primary bronchogenic neoplasm. Focal hypermetabolism ?in the left hilum is concerning for metastatic disease  ?Rx   Left lung SBRT 50 Gy in 5 fractions from 04/14/2020 to 04/24/2020 ?? ? ?F/u per oncology planned  ?

## 2021-09-14 DIAGNOSIS — R0602 Shortness of breath: Secondary | ICD-10-CM | POA: Diagnosis not present

## 2021-09-14 DIAGNOSIS — J449 Chronic obstructive pulmonary disease, unspecified: Secondary | ICD-10-CM | POA: Diagnosis not present

## 2021-09-14 DIAGNOSIS — C3492 Malignant neoplasm of unspecified part of left bronchus or lung: Secondary | ICD-10-CM | POA: Diagnosis not present

## 2021-09-22 ENCOUNTER — Other Ambulatory Visit: Payer: Self-pay | Admitting: Physician Assistant
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
## 2021-10-02 ENCOUNTER — Ambulatory Visit (HOSPITAL_COMMUNITY)
Admission: RE | Admit: 2021-10-02 | Discharge: 2021-10-02 | Disposition: A | Discharge status: Home / Self Care | Payer: Medicare HMO | Source: Ambulatory Visit | Attending: Internal Medicine | Admitting: Internal Medicine

## 2021-10-02 ENCOUNTER — Ambulatory Visit (INDEPENDENT_AMBULATORY_CARE_PROVIDER_SITE_OTHER): Payer: Medicare HMO | Admitting: Internal Medicine

## 2021-10-02 ENCOUNTER — Encounter: Payer: Self-pay | Admitting: Internal Medicine

## 2021-10-02 DIAGNOSIS — J449 Chronic obstructive pulmonary disease, unspecified: Secondary | ICD-10-CM | POA: Diagnosis not present

## 2021-10-02 DIAGNOSIS — R0609 Other forms of dyspnea: Secondary | ICD-10-CM | POA: Insufficient documentation

## 2021-10-02 DIAGNOSIS — F17210 Nicotine dependence, cigarettes, uncomplicated: Secondary | ICD-10-CM | POA: Diagnosis not present

## 2021-10-02 DIAGNOSIS — I700 Atherosclerosis of aorta: Secondary | ICD-10-CM | POA: Diagnosis not present

## 2021-10-02 DIAGNOSIS — J439 Emphysema, unspecified: Secondary | ICD-10-CM | POA: Diagnosis not present

## 2021-10-02 DIAGNOSIS — I10 Essential (primary) hypertension: Secondary | ICD-10-CM | POA: Diagnosis not present

## 2021-10-02 DIAGNOSIS — E039 Hypothyroidism, unspecified: Secondary | ICD-10-CM | POA: Diagnosis not present

## 2021-10-02 DIAGNOSIS — R0602 Shortness of breath: Secondary | ICD-10-CM | POA: Diagnosis not present

## 2021-10-02 MED ORDER — DOXYCYCLINE HYCLATE 100 MG PO TABS: 100.0000 mg | ORAL_TABLET | Freq: Two times a day (BID) | ORAL | 0 refills | Status: DC

## 2021-10-02 MED ORDER — PREDNISONE 10 MG PO TABS: ORAL_TABLET | ORAL | 0 refills | Status: DC

## 2021-10-02 MED ORDER — FAMOTIDINE 20 MG PO TABS: ORAL_TABLET | ORAL | 11 refills | Status: DC

## 2021-10-02 MED ORDER — PANTOPRAZOLE SODIUM 40 MG PO TBEC: 40.0000 mg | DELAYED_RELEASE_TABLET | Freq: Every day | ORAL | 2 refills | Status: DC

## 2021-10-02 NOTE — Patient Instructions (Addendum)
The key is to stop smoking completely before smoking completely stops you!  Only use your albuterol as a rescue medication to be used if you can't catch your breath by resting or doing a relaxed purse lip breathing pattern.  - The less you use it, the better it will work when you need it. - Ok to use up to 2 puffs  every 4 hours if you must but call for immediate appointment if use goes up over your usual need - Don't leave home without it !!  (think of it like the spare tire for your car)   Also ok to try albuterol 15 min before an activity (on alternating days)  that you know would usually make you short of breath and see if it makes any difference and if makes none then don't take albuterol after activity unless you can't catch your breath as this means it's the resting that helps, not the albuterol.   Pantoprazole (protonix) 40 mg   Take  30-60 min before first meal of the day and Pepcid (famotidine)  20 mg after supper until return to office - this is the best way to tell whether stomach acid is contributing to your problem.    Make sure you check your oxygen saturation  AT  your highest level of activity (not after you stop)   to be sure it stays over 90% and adjust  02 flow upward to maintain this level if needed but remember to turn it back to previous settings when you stop (to conserve your supply).    Doxycycline 100 mg twice daily x 10 days take with large glass of water   Prednisone 10 mg take  4 each am x 2 days,   2 each am x 2 days,  1 each am x 2 days and stop   Please remember to go to the lab department   for your tests - we will call you with the results when they are available.     Please remember to go to the  x-ray department  @  [facility] for your tests - we will call you with the results when they are available     Please schedule a follow up office visit in 4 weeks, sooner if needed  with all medications /inhalers/ solutions in hand so we can verify exactly  what you are taking. This includes all medications from all doctors and over the counters

## 2021-10-02 NOTE — Assessment & Plan Note (Signed)
Counseled re importance of smoking cessation but did not meet time criteria for separate billing   °

## 2021-10-02 NOTE — Assessment & Plan Note (Addendum)
Active smoker Spirometry 03/23/2017  FEV1 1.68 (58%)  Ratio 60 with mild curvature s rx prior   - PFT's  07/26/2017  FEV1 1.97 (73 % ) ratio 62  p 13 % improvement from saba p nothing prior to study with DLCO  50 % corrects to 59 % for alv volume. - try off zestoril 01/17/2020  - 08/26/2021   Walked on RA  x  2  lap(s) =  approx 500  ft  @ avg pace, stopped due to sob with lowest 02 sats 98% - 10/02/2021   Walked on RA  x  3   lap(s) =  approx 450  ft  @ mod fast pace, stopped due to end of study with lowest 02 sats 95%   - 10/02/2021 restart max rx for GERD/ pred x 6 d / doxy x 10  - 10/02/2021  After extensive coaching inhaler device,  effectiveness =    75% (late trigger)   DDX of  difficult airways management almost all start with A and  include Adherence, Ace Inhibitors, Acid Reflux, Active Sinus Disease, Alpha 1 Antitripsin deficiency, Anxiety masquerading as Airways dz,  ABPA,  Allergy(esp in young), Aspiration (esp in elderly), Adverse effects of meds,  Active smoking or vaping, A bunch of PE's (a small clot burden can't cause this syndrome unless there is already severe underlying pulm or vascular dz with poor reserve) plus two Bs  = Bronchiectasis and Beta blocker use..and one C= CHF  Adherence is always the initial "prime suspect" and is a multilayered concern that requires a "trust but verify" approach in every patient - starting with knowing how to use medications, especially inhalers, correctly, keeping up with refills and understanding the fundamental difference between maintenance and prns vs those medications only taken for a very short course and then stopped and not refilled.  - see hfa teaching  - return with all meds in hand using a trust but verify approach to confirm accurate Medication  Reconciliation The principal here is that until we are certain that the  patients are doing what we've asked, it makes no sense to ask them to do more.   Active smoking (see separate a/p)   ? Acid (or  non-acid) GERD > always difficult to exclude as up to 75% of pts in some series report no assoc GI/ Heartburn symptoms> rec max (24h)  acid suppression and diet restrictions/ reviewed and instructions given in writing.   ? Active sinus dz/ rhinits/ bronchitis > doubt but needs rx with doxy anyway due to tike bite  rx x 10 day  ? Allergy/ asthma with Eos onlhy 0.2 so doubt large component >>>  Prednisone 10 mg take  4 each am x 2 days,   2 each am x 2 days,  1 each am x 2 days and stop  - hold off on maint rx until sort out how he responds to rx s maint rx and just use saba prn  Re SABA :  I spent extra time with pt today reviewing appropriate use of albuterol for prn use on exertion with the following points: 1) saba is for relief of sob that does not improve by walking a slower pace or resting but rather if the pt does not improve after trying this first. 2) If the pt is convinced, as many are, that saba helps recover from activity faster then it's easy to tell if this is the case by re-challenging : ie stop, take the inhaler,  then p 5 minutes try the exact same activity (intensity of workload) that just caused the symptoms and see if they are substantially diminished or not after saba 3) if there is an activity that reproducibly causes the symptoms, try the saba 15 min before the activity on alternate days   If in fact the saba really does help, then fine to continue to use it prn but advised may need to look closer at the maintenance regimen being used to achieve better control of airways disease with exertion.   ? Anxiety/depression/ deconditioning  > usually at the bottom of this list of usual suspects but   may interfere with adherence and also interpretation of response or lack thereof to symptom management which can be quite subjective.   ? Alpha one AT > ordered phenotype   ? A bunch of PE's > can't rule it out with elevated d dimer but c/o new / worsening doe so needs at least a v//q if  bun/creat too high for CTa  ? chf >  Ruled out by bnp well less than  100

## 2021-10-03 LAB — BASIC METABOLIC PANEL
BUN/Creatinine Ratio: 18 (ref 10–24)
BUN: 23 mg/dL (ref 8–27)
CO2: 21 mmol/L (ref 20–29)
Calcium: 9.9 mg/dL (ref 8.6–10.2)
Chloride: 100 mmol/L (ref 96–106)
Creatinine, Ser: 1.25 mg/dL (ref 0.76–1.27)
Glucose: 107 mg/dL — ABNORMAL HIGH (ref 70–99)
Potassium: 5.6 mmol/L — ABNORMAL HIGH (ref 3.5–5.2)
Sodium: 137 mmol/L (ref 134–144)
eGFR: 57 mL/min/{1.73_m2} — ABNORMAL LOW (ref >59)

## 2021-10-03 LAB — CBC WITH DIFFERENTIAL/PLATELET
Basophils Absolute: 0.1 10*3/uL (ref 0.0–0.2)
Basos: 2 %
EOS (ABSOLUTE): 0.2 10*3/uL (ref 0.0–0.4)
Eos: 3 %
Hematocrit: 47.6 % (ref 37.5–51.0)
Hemoglobin: 16 g/dL (ref 13.0–17.7)
Immature Grans (Abs): 0 10*3/uL (ref 0.0–0.1)
Immature Granulocytes: 0 %
Lymphocytes Absolute: 1.8 10*3/uL (ref 0.7–3.1)
Lymphs: 27 %
MCH: 32.2 pg (ref 26.6–33.0)
MCHC: 33.6 g/dL (ref 31.5–35.7)
MCV: 96 fL (ref 79–97)
Monocytes Absolute: 0.7 10*3/uL (ref 0.1–0.9)
Monocytes: 10 %
Neutrophils Absolute: 4 10*3/uL (ref 1.4–7.0)
Neutrophils: 58 %
Platelets: 235 10*3/uL (ref 150–450)
RBC: 4.97 x10E6/uL (ref 4.14–5.80)
RDW: 12.9 % (ref 11.6–15.4)
WBC: 6.8 10*3/uL (ref 3.4–10.8)

## 2021-10-03 LAB — TSH: TSH: 16.2 u[IU]/mL — ABNORMAL HIGH (ref 0.450–4.500)

## 2021-10-03 LAB — D-DIMER, QUANTITATIVE: D-DIMER: 4.97 mg/L FEU — ABNORMAL HIGH (ref 0.00–0.49)

## 2021-10-03 LAB — SEDIMENTATION RATE: Sed Rate: 2 mm/hr (ref 0–30)

## 2021-10-03 LAB — BRAIN NATRIURETIC PEPTIDE: BNP: 67.4 pg/mL (ref 0.0–100.0)

## 2021-10-04 ENCOUNTER — Encounter: Payer: Self-pay | Admitting: Internal Medicine

## 2021-10-04 DIAGNOSIS — E039 Hypothyroidism, unspecified: Secondary | ICD-10-CM | POA: Insufficient documentation

## 2021-10-04 NOTE — Assessment & Plan Note (Addendum)
D/c acei 01/17/2020 (variable sob with pseudowheeze on exam )   Lab Results  Component Value Date   CREATININE 1.25 10/02/2021   CREATININE 1.02 03/18/2021   CREATININE 1.08 11/12/2020    Probably over controlled at present on benicar 40 mg daily though medication reconciliation remains a major challenge  >>>> rec reduce benicar to 20 mg daily and recheck creat/K in 2 weeks here or per PCP Avoid excess veg/ fruits/K supplements in the meantime.  >>> regroup here in 4 weeks with all meds in hand using a trust but verify approach to confirm accurate Medication  Reconciliation The principal here is that until we are certain that the  patients are doing what we've asked, it makes no sense to ask them to do more.   Each maintenance medication was reviewed in detail including emphasizing most importantly the difference between maintenance and prns and under what circumstances the prns are to be triggered using an action plan format where appropriate.  Total time for H and P, chart review, counseling, reviewing hfa device(s) , directly observing portions of ambulatory 02 saturation study/ and generating customized AVS unique to this office visit / same day charting = 43 min with complex pt with refractory/ progessive symptoms from  multiple sources

## 2021-10-04 NOTE — Assessment & Plan Note (Signed)
Labs reviewed/ w/u in progress

## 2021-10-04 NOTE — Assessment & Plan Note (Addendum)
TSH  16.2  10/02/2021   rec synthroid 25 mcg per day and f/u PCP

## 2021-10-05 ENCOUNTER — Other Ambulatory Visit: Payer: Self-pay

## 2021-10-05 ENCOUNTER — Telehealth: Payer: Self-pay | Admitting: Internal Medicine

## 2021-10-05 DIAGNOSIS — R0609 Other forms of dyspnea: Secondary | ICD-10-CM

## 2021-10-05 LAB — ALPHA-1-ANTITRYPSIN PHENOTYP: A-1 Antitrypsin: 158 mg/dL (ref 101–187)

## 2021-10-05 MED ORDER — LEVOTHYROXINE SODIUM 25 MCG PO TABS: 25.0000 ug | ORAL_TABLET | Freq: Every day | ORAL | 1 refills | Status: DC

## 2021-10-05 NOTE — Telephone Encounter (Signed)
Spoke to patient (see result note) nothing further needed

## 2021-10-06 ENCOUNTER — Encounter (HOSPITAL_COMMUNITY)
Admission: RE | Admit: 2021-10-06 | Discharge: 2021-10-06 | Disposition: A | Discharge status: Home / Self Care | Payer: Medicare HMO | Source: Ambulatory Visit | Attending: Internal Medicine | Admitting: Internal Medicine

## 2021-10-06 ENCOUNTER — Ambulatory Visit (HOSPITAL_COMMUNITY)
Admission: RE | Admit: 2021-10-06 | Discharge: 2021-10-06 | Disposition: A | Discharge status: Home / Self Care | Payer: Medicare HMO | Source: Ambulatory Visit | Attending: Internal Medicine | Admitting: Internal Medicine

## 2021-10-06 ENCOUNTER — Encounter (HOSPITAL_COMMUNITY): Payer: Self-pay

## 2021-10-06 DIAGNOSIS — R0602 Shortness of breath: Secondary | ICD-10-CM | POA: Diagnosis not present

## 2021-10-06 DIAGNOSIS — R0609 Other forms of dyspnea: Secondary | ICD-10-CM | POA: Diagnosis not present

## 2021-10-06 DIAGNOSIS — R7989 Other specified abnormal findings of blood chemistry: Secondary | ICD-10-CM | POA: Diagnosis not present

## 2021-10-06 DIAGNOSIS — I2510 Atherosclerotic heart disease of native coronary artery without angina pectoris: Secondary | ICD-10-CM | POA: Diagnosis not present

## 2021-10-06 DIAGNOSIS — J449 Chronic obstructive pulmonary disease, unspecified: Secondary | ICD-10-CM | POA: Diagnosis not present

## 2021-10-06 DIAGNOSIS — J439 Emphysema, unspecified: Secondary | ICD-10-CM | POA: Diagnosis not present

## 2021-10-06 DIAGNOSIS — I10 Essential (primary) hypertension: Secondary | ICD-10-CM | POA: Diagnosis not present

## 2021-10-06 MED ORDER — TECHNETIUM TO 99M ALBUMIN AGGREGATED
4.0000 | Freq: Once | INTRAVENOUS | Status: AC | PRN
  Administered 2021-10-06: 4.4 via INTRAVENOUS

## 2021-10-06 NOTE — Progress Notes (Unsigned)
Cardiology Office Note  Date: 10/07/2021   ID: [name] Graham, DOB 1936-01-25, MRN [id number]  PCP:  Lysbeth Penner, FNP  Cardiologist:  Rozann Lesches, MD Electrophysiologist:  None   Chief Complaint  Patient presents with   Cardiac follow-up    History of Present Illness: [name] Graham is an 86 y.o. male last seen in December 2022.  He is here for a routine visit.  Reports stable NYHA class II or III dyspnea with typical activities, no obvious angina symptoms or nitroglycerin use.  He continues to follow with both Dr. Melvyn Novas and Dr. Delton Coombes, I reviewed the interval chart.  We went over his medications, Benicar was just reduced to 20 mg daily by Dr. Melvyn Novas given concerns about hyperkalemia.  I reviewed his recent lab work.  His current cardiomyopathy regimen includes Benicar and Toprol-XL.  Previously did not tolerate Entresto.  Not on MRA given history of hyperkalemia.  Presently not on a diuretic.  Recent ventilation/perfusion lung scan was negative for pulmonary embolus.  He had a PET scan in March that showed no evidence of progressive or metastatic lung cancer.  I personally reviewed his ECG today which shows sinus rhythm with right bundle branch block and left anterior fascicular block.   Past Medical History:  Diagnosis Date   Arthritis    COPD (chronic obstructive pulmonary disease) ([place])    Coronary atherosclerosis of native coronary artery    BMS circ and PTCA PDA 2007, DES RCA 10/09, DES LAD 2/12   Essential hypertension    GERD (gastroesophageal reflux disease)    Hearing loss    Heart attack ([place])    x 3 (latest 2009)   History of kidney stones    Hyperlipidemia    Ischemic cardiomyopathy    NSTEMI (non-ST elevated myocardial infarction) ([place])    2007   Squamous cell lung cancer [facility])     Past Surgical History:  Procedure Laterality Date   ANGIOPLASTY     stent   BRONCHIAL BIOPSY  02/19/2020   Procedure: BRONCHIAL BIOPSIES;  Surgeon: Collene Gobble, MD;  Location: [facility];  Service: Pulmonary;;   BRONCHIAL BRUSHINGS  02/19/2020   Procedure: BRONCHIAL BRUSHINGS;  Surgeon: Collene Gobble, MD;  Location: [facility] ENDOSCOPY;  Service: Pulmonary;;   BRONCHIAL NEEDLE ASPIRATION BIOPSY  02/19/2020   Procedure: BRONCHIAL NEEDLE ASPIRATION BIOPSIES;  Surgeon: Collene Gobble, MD;  Location: [facility] ENDOSCOPY;  Service: Pulmonary;;   COLONOSCOPY     FIDUCIAL MARKER PLACEMENT  02/19/2020   Procedure: FIDUCIAL MARKER PLACEMENT;  Surgeon: Collene Gobble, MD;  Location: [facility] ENDOSCOPY;  Service: Pulmonary;;   HERNIA REPAIR     LIPOMA EXCISION Right    hip/buttox   TONSILLECTOMY     as a child   VIDEO BRONCHOSCOPY WITH ENDOBRONCHIAL NAVIGATION N/A 02/19/2020   Procedure: VIDEO BRONCHOSCOPY WITH ENDOBRONCHIAL NAVIGATION;  Surgeon: Collene Gobble, MD;  Location: [facility] ENDOSCOPY;  Service: Pulmonary;  Laterality: N/A;    Current Outpatient Medications  Medication Sig Dispense Refill   albuterol (VENTOLIN HFA) 108 (90 Base) MCG/ACT inhaler Inhale into the lungs every 6 (six) hours as needed for wheezing or shortness of breath.     amLODipine (NORVASC) 5 MG tablet Take 1 tablet by mouth once daily 90 tablet 1   aspirin 325 MG tablet Take by mouth.     clopidogrel (PLAVIX) 75 MG tablet TAKE 1 TABLET BY MOUTH ONCE DAILY . APPOINTMENT REQUIRED FOR FUTURE REFILLS 90 tablet 3  doxycycline (VIBRA-TABS) 100 MG tablet Take 1 tablet (100 mg total) by mouth 2 (two) times daily. 20 tablet 0   Ensure (ENSURE) Take 237 mLs by mouth 2 (two) times daily.     famotidine (PEPCID) 20 MG tablet One after supper 30 tablet 11   ipratropium (ATROVENT) 0.06 % nasal spray Place 2 sprays into both nostrils 3 (three) times daily.     levothyroxine (SYNTHROID) 25 MCG tablet Take 1 tablet (25 mcg total) by mouth daily before breakfast. 30 tablet 1   metoprolol succinate (TOPROL XL) 25 MG 24 hr tablet Take 1 tablet (25 mg total) by mouth daily. 90 tablet 3   nitroGLYCERIN  (NITROSTAT) 0.4 MG SL tablet DISSOLVE ONE TABLET UNDER THE TONGUE EVERY 5 MINUTES AS NEEDED FOR CHEST PAIN.  DO NOT EXCEED A TOTAL OF 3 DOSES IN 15 MINUTES 25 tablet 3   olmesartan (BENICAR) 20 MG tablet Take 1 tablet (20 mg total) by mouth daily. 90 tablet 2   OXYGEN Inhale into the lungs.     pantoprazole (PROTONIX) 40 MG tablet Take 1 tablet (40 mg total) by mouth daily. Take 30-60 min before first meal of the day 30 tablet 2   simvastatin (ZOCOR) 80 MG tablet TAKE 1 TABLET BY MOUTH AT BEDTIME 90 tablet 3   cyclobenzaprine (FLEXERIL) 5 MG tablet Take 1 tablet (5 mg total) by mouth 3 (three) times daily as needed for muscle spasms. (Patient not taking: Reported on 10/07/2021) 90 tablet 0   fluticasone (FLONASE) 50 MCG/ACT nasal spray Place into both nostrils 3 (three) times daily as needed for allergies or rhinitis. (Patient not taking: Reported on 10/07/2021)     Naphazoline HCl (CLEAR EYES OP) Place 1 drop into both eyes daily as needed (irritation / redness). (Patient not taking: Reported on 10/07/2021)     predniSONE (DELTASONE) 10 MG tablet Take  4 each am x 2 days,   2 each am x 2 days,  1 each am x 2 days and stop (Patient not taking: Reported on 10/07/2021) 14 tablet 0   tiZANidine (ZANAFLEX) 4 MG tablet Take by mouth. (Patient not taking: Reported on 10/07/2021)     No current facility-administered medications for this visit.   Allergies:  Entresto [sacubitril-valsartan]   ROS: No orthopnea or PND.  Physical Exam: VS:  BP 122/70   Pulse 71   Ht 5\' 10"  (1.778 m)   Wt 131 lb 9.6 oz (59.7 kg)   SpO2 96%   BMI 18.88 kg/m , BMI Body mass index is 18.88 kg/m.  Wt Readings from Last 3 Encounters:  10/07/21 131 lb 9.6 oz (59.7 kg)  10/02/21 129 lb 12.8 oz (58.9 kg)  08/26/21 131 lb 12.8 oz (59.8 kg)    General: Patient appears comfortable at rest. HEENT: Conjunctiva and lids normal. Neck: Supple, no elevated JVP or carotid bruits, no thyromegaly. Lungs: No wheezing, nonlabored breathing  at rest. Cardiac: Regular rate and rhythm, no S3, 1/6 systolic murmur. Extremities: No pitting edema.  ECG:  An ECG dated 09/01/2020 was personally reviewed today and demonstrated:  Sinus rhythm with PAC, right bundle branch block and left anterior fascicular block.  Recent Labwork: 03/18/2021: ALT 12; AST 18 10/02/2021: BNP 67.4; BUN 23; Creatinine, Ser 1.25; Hemoglobin 16.0; Platelets 235; Potassium 5.6; Sodium 137; TSH 16.200     Component Value Date/Time   CHOL 138 05/09/2014 0734   CHOL 144 06/15/2013 0931   TRIG 92 05/09/2014 0734   HDL 55 05/09/2014 0734  HDL 61 06/15/2013 0931   CHOLHDL 2.5 05/09/2014 0734   VLDL 18 05/09/2014 0734   LDLCALC 65 05/09/2014 0734   LDLCALC 69 06/15/2013 0931    Other Studies Reviewed Today:  Cardiac monitor May 2022: ZIO XT reviewed.  14 days analyzed.  Predominant rhythm is sinus with heart rate ranging from 52 bpm up to 132 bpm and average heart rate 68 bpm.  There were rare PACs including couplets and triplets representing less than 1% total beats.  Relatively frequent PVCs were noted representing 5.2% total beats with rare couplets and triplets as well as ventricular bigeminy and trigeminy.  There were 5 brief episodes of NSVT, no longer than 5 beats observed.  No sustained ventricular events.  Several episodes of PSVT were noted, the longest of which lasted approximately 15 seconds with average heart rate of 112.  There were no pauses.   Echocardiogram 10/31/2020:  1. Left ventricular ejection fraction, by estimation, is 40 to 45%. The  left ventricle has mildly decreased function. The left ventricle  demonstrates regional wall motion abnormalities (see scoring  diagram/findings for description). There is moderate  left ventricular hypertrophy. Left ventricular diastolic parameters are  consistent with Grade I diastolic dysfunction (impaired relaxation).   2. Right ventricular systolic function is normal. The right ventricular  size is normal.  Tricuspid regurgitation signal is inadequate for assessing  PA pressure.   3. The mitral valve is grossly normal. Mild mitral valve regurgitation.   4. The aortic valve is tricuspid. Aortic valve regurgitation is not  visualized. Mild aortic valve sclerosis is present, with no evidence of  aortic valve stenosis.   5. Aortic dilatation noted. There is mild dilatation of the aortic root,  measuring 40 mm.   6. The inferior vena cava is normal in size with greater than 50%  respiratory variability, suggesting right atrial pressure of 3 mmHg.   Assessment and Plan:  1.  HFrEF with ischemic cardiomyopathy, LVEF 40 to 45% range.  Managing medically at this time, no evidence of fluid overload on examination and weight is stable.  Previously did not tolerate Entresto, not on MRA given propensity to hyperkalemia.  He has declined ICD already.  Continue Toprol-XL and Benicar at 20 mg daily.  2.  Multivessel CAD status post PCI, most recently DES to the LAD in 2012.  No active angina symptoms.  Continue aspirin, Plavix, and Zocor.  He has as needed nitroglycerin available for use.  3.  Squamous cell carcinoma of the left lung status post XRT.  He continues to follow with Dr. Delton Coombes.  Recent PET scan in March did not indicate any progression.  4.  COPD with history of tobacco abuse and chronic hypoxic respiratory failure.  He continues to see Dr. Melvyn Novas.  Medication Adjustments/Labs and Tests Ordered: Current medicines are reviewed at length with the patient today.  Concerns regarding medicines are outlined above.   Tests Ordered: Orders Placed This Encounter  Procedures   EKG 12-Lead    Medication Changes: Meds ordered this encounter  Medications   olmesartan (BENICAR) 20 MG tablet    Sig: Take 1 tablet (20 mg total) by mouth daily.    Dispense:  90 tablet    Refill:  2    10/07/2021 dose decrease    Disposition:  Follow up  6 months.  Signed, Satira Sark, MD, [facility] 10/07/2021  10:12 AM    [facility], [place], [place] [address] Phone: [phone]; Fax: [phone]

## 2021-10-07 ENCOUNTER — Ambulatory Visit (INDEPENDENT_AMBULATORY_CARE_PROVIDER_SITE_OTHER): Payer: Medicare HMO | Admitting: Cardiology

## 2021-10-07 ENCOUNTER — Encounter: Payer: Self-pay | Admitting: Cardiology

## 2021-10-07 VITALS — BP 122/70 | HR 71 | Ht 70.0 in | Wt 131.6 lb

## 2021-10-07 DIAGNOSIS — I25119 Atherosclerotic heart disease of native coronary artery with unspecified angina pectoris: Secondary | ICD-10-CM | POA: Diagnosis not present

## 2021-10-07 DIAGNOSIS — I5020 Unspecified systolic (congestive) heart failure: Secondary | ICD-10-CM

## 2021-10-07 MED ORDER — OLMESARTAN MEDOXOMIL 20 MG PO TABS: 20.0000 mg | ORAL_TABLET | Freq: Every day | ORAL | 2 refills | Status: DC

## 2021-10-07 NOTE — Patient Instructions (Addendum)
Medication Instructions:  Your physician has recommended you make the following change in your medication:  Decrease benicar to 20 mg daily Continue other medications the same  Labwork: none  Testing/Procedures: none  Follow-Up: Your physician recommends that you schedule a follow-up appointment in: 6 months  Any Other Special Instructions Will Be Listed Below (If Applicable).  If you need a refill on your cardiac medications before your next appointment, please call your pharmacy.

## 2021-10-15 DIAGNOSIS — R0602 Shortness of breath: Secondary | ICD-10-CM | POA: Diagnosis not present

## 2021-10-15 DIAGNOSIS — C3492 Malignant neoplasm of unspecified part of left bronchus or lung: Secondary | ICD-10-CM | POA: Diagnosis not present

## 2021-10-15 DIAGNOSIS — J449 Chronic obstructive pulmonary disease, unspecified: Secondary | ICD-10-CM | POA: Diagnosis not present

## 2021-10-21 ENCOUNTER — Encounter: Payer: Self-pay | Admitting: Internal Medicine

## 2021-10-21 ENCOUNTER — Ambulatory Visit (INDEPENDENT_AMBULATORY_CARE_PROVIDER_SITE_OTHER): Payer: Medicare HMO | Admitting: Internal Medicine

## 2021-10-21 DIAGNOSIS — F17210 Nicotine dependence, cigarettes, uncomplicated: Secondary | ICD-10-CM | POA: Diagnosis not present

## 2021-10-21 DIAGNOSIS — I10 Essential (primary) hypertension: Secondary | ICD-10-CM | POA: Diagnosis not present

## 2021-10-21 DIAGNOSIS — J449 Chronic obstructive pulmonary disease, unspecified: Secondary | ICD-10-CM | POA: Diagnosis not present

## 2021-10-21 DIAGNOSIS — E039 Hypothyroidism, unspecified: Secondary | ICD-10-CM | POA: Diagnosis not present

## 2021-10-21 MED ORDER — BUDESONIDE-FORMOTEROL FUMARATE 80-4.5 MCG/ACT IN AERO: INHALATION_SPRAY | RESPIRATORY_TRACT | 11 refills | Status: DC

## 2021-10-21 NOTE — Assessment & Plan Note (Signed)
Active smoker/ MS phenotype with level 159 10/02/21 Spirometry 03/23/2017  FEV1 1.68 (58%)  Ratio 60 with mild curvature s rx prior   - PFT's  07/26/2017  FEV1 1.97 (73 % ) ratio 62  p 13 % improvement from saba p nothing prior to study with DLCO  50 % corrects to 59 % for alv volume. - try off zestoril 01/17/2020  - 08/26/2021   Walked on RA  x  2  lap(s) =  approx 500  ft  @ avg pace, stopped due to sob with lowest 02 sats 98% - 10/02/2021   Walked on RA  x  3   lap(s) =  approx 450  ft  @ mod fast pace, stopped due to end of study with lowest 02 sats 95%   - 10/02/2021 restart max rx for GERD/ pred x 6 d / doxy x 10  - 10/21/2021  After extensive coaching inhaler device,  effectiveness =    75% > try symbicort 80 x 2 on days with activity planned   Active smoker with mostly group B symptoms > can't afford lama/laba so rx try symb 80 2 puffs prior to activities prn and saba just as rescue and see if that helps

## 2021-10-21 NOTE — Patient Instructions (Addendum)
Symbicort 80  x 2 puffs on days you are going to be active - take 15 min before activity   Only use your albuterol as a rescue medication to be used if you can't catch your breath by resting or doing a relaxed purse lip breathing pattern.  - The less you use it, the better it will work when you need it. - Ok to use up to 2 puffs  every 4 hours if you must but call for immediate appointment if use goes up over your usual need - Don't leave home without it !!  (think of it like the spare tire for your car)  The key is to stop smoking completely before smoking completely stops you!   Please remember to go to the lab department   for your tests - we will call you with the results when they are available.      Please schedule a follow up visit in 6  months but call sooner if needed

## 2021-10-21 NOTE — Assessment & Plan Note (Signed)
Counseled re importance of smoking cessation but did not meet time criteria for separate billing   °
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
## 2021-10-21 NOTE — Assessment & Plan Note (Addendum)
TSH  16.2  10/02/2021   rec synthroid 25 mcg per day   Lab Results  Component Value Date   TSH 9.260 (H) 10/21/2021    >>> increase synthryoid to 50 mcg daily and  f/u PCP in 4-6  Weeks           Each maintenance medication was reviewed in detail including emphasizing most importantly the difference between maintenance and prns and under what circumstances the prns are to be triggered using an action plan format where appropriate.  Total time for H and P, chart review, counseling, reviewing hfa/neb  device(s) and generating customized AVS unique to this office visit / same day charting > 30 min for  refractory respiratory  symptoms of uncertain etiology

## 2021-10-21 NOTE — Assessment & Plan Note (Addendum)
D/c acei 01/17/2020 (variable sob with pseudowheeze on exam )  Lab Results  Component Value Date   CREATININE 1.05 10/21/2021   CREATININE 1.25 10/02/2021   CREATININE 1.02 03/18/2021    Adequate control on present rx, reviewed in detail with pt > no change in rx needed   >>> f/u pcp

## 2021-10-22 ENCOUNTER — Encounter: Payer: Self-pay | Admitting: Internal Medicine

## 2021-10-22 LAB — TSH: TSH: 9.26 u[IU]/mL — ABNORMAL HIGH (ref 0.450–4.500)

## 2021-10-22 LAB — BASIC METABOLIC PANEL
BUN/Creatinine Ratio: 12 (ref 10–24)
BUN: 13 mg/dL (ref 8–27)
CO2: 21 mmol/L (ref 20–29)
Calcium: 9.1 mg/dL (ref 8.6–10.2)
Chloride: 99 mmol/L (ref 96–106)
Creatinine, Ser: 1.05 mg/dL (ref 0.76–1.27)
Glucose: 93 mg/dL (ref 70–99)
Potassium: 5 mmol/L (ref 3.5–5.2)
Sodium: 136 mmol/L (ref 134–144)
eGFR: 70 mL/min/{1.73_m2} (ref >59)

## 2021-11-06 ENCOUNTER — Ambulatory Visit: Payer: Medicare HMO | Admitting: Internal Medicine

## 2021-11-09 ENCOUNTER — Telehealth: Payer: Self-pay | Admitting: Internal Medicine

## 2021-11-09 MED ORDER — LEVOTHYROXINE SODIUM 50 MCG PO TABS: 50.0000 ug | ORAL_TABLET | Freq: Every day | ORAL | 0 refills | Status: DC

## 2021-11-09 NOTE — Telephone Encounter (Signed)
Called and spoke to patient. He states last results call he got he was told to increase synthroid to 50 mcg a day but pharmacy gave him 30 tablets of 25 mcg, so he will run out in 15 days.  I confirmed from lab result note 6/21 that patient was correct and needs 84mcg day, fixed prescription so that when patient has finished current bottle of synthroid, the appropriate rx will be ready for him. Nothing further needed

## 2021-11-23 ENCOUNTER — Inpatient Hospital Stay (HOSPITAL_COMMUNITY): Payer: Medicare HMO | Attending: Hematology

## 2021-11-23 ENCOUNTER — Telehealth: Payer: Self-pay | Admitting: Cardiology

## 2021-11-23 ENCOUNTER — Ambulatory Visit (HOSPITAL_COMMUNITY)
Admission: RE | Admit: 2021-11-23 | Discharge: 2021-11-23 | Disposition: A | Discharge status: Home / Self Care | Payer: Medicare HMO | Source: Ambulatory Visit | Attending: Hematology | Admitting: Hematology

## 2021-11-23 DIAGNOSIS — I700 Atherosclerosis of aorta: Secondary | ICD-10-CM | POA: Insufficient documentation

## 2021-11-23 DIAGNOSIS — Z87442 Personal history of urinary calculi: Secondary | ICD-10-CM | POA: Insufficient documentation

## 2021-11-23 DIAGNOSIS — K219 Gastro-esophageal reflux disease without esophagitis: Secondary | ICD-10-CM | POA: Insufficient documentation

## 2021-11-23 DIAGNOSIS — I252 Old myocardial infarction: Secondary | ICD-10-CM | POA: Insufficient documentation

## 2021-11-23 DIAGNOSIS — C3492 Malignant neoplasm of unspecified part of left bronchus or lung: Secondary | ICD-10-CM | POA: Insufficient documentation

## 2021-11-23 DIAGNOSIS — C3432 Malignant neoplasm of lower lobe, left bronchus or lung: Secondary | ICD-10-CM | POA: Insufficient documentation

## 2021-11-23 DIAGNOSIS — J439 Emphysema, unspecified: Secondary | ICD-10-CM | POA: Diagnosis not present

## 2021-11-23 DIAGNOSIS — E785 Hyperlipidemia, unspecified: Secondary | ICD-10-CM | POA: Insufficient documentation

## 2021-11-23 DIAGNOSIS — C3490 Malignant neoplasm of unspecified part of unspecified bronchus or lung: Secondary | ICD-10-CM | POA: Diagnosis not present

## 2021-11-23 DIAGNOSIS — Z801 Family history of malignant neoplasm of trachea, bronchus and lung: Secondary | ICD-10-CM | POA: Insufficient documentation

## 2021-11-23 DIAGNOSIS — Z7951 Long term (current) use of inhaled steroids: Secondary | ICD-10-CM | POA: Insufficient documentation

## 2021-11-23 DIAGNOSIS — Z7902 Long term (current) use of antithrombotics/antiplatelets: Secondary | ICD-10-CM | POA: Insufficient documentation

## 2021-11-23 DIAGNOSIS — Z79899 Other long term (current) drug therapy: Secondary | ICD-10-CM | POA: Insufficient documentation

## 2021-11-23 DIAGNOSIS — I2510 Atherosclerotic heart disease of native coronary artery without angina pectoris: Secondary | ICD-10-CM | POA: Insufficient documentation

## 2021-11-23 DIAGNOSIS — I255 Ischemic cardiomyopathy: Secondary | ICD-10-CM | POA: Insufficient documentation

## 2021-11-23 DIAGNOSIS — F17210 Nicotine dependence, cigarettes, uncomplicated: Secondary | ICD-10-CM | POA: Insufficient documentation

## 2021-11-23 DIAGNOSIS — Z79890 Hormone replacement therapy (postmenopausal): Secondary | ICD-10-CM | POA: Insufficient documentation

## 2021-11-23 LAB — COMPREHENSIVE METABOLIC PANEL
ALT: 9 U/L (ref 0–44)
AST: 15 U/L (ref 15–41)
Albumin: 3.8 g/dL (ref 3.5–5.0)
Alkaline Phosphatase: 55 U/L (ref 38–126)
Anion gap: 5 (ref 5–15)
BUN: 16 mg/dL (ref 8–23)
CO2: 25 mmol/L (ref 22–32)
Calcium: 8.8 mg/dL — ABNORMAL LOW (ref 8.9–10.3)
Chloride: 106 mmol/L (ref 98–111)
Creatinine, Ser: 1.09 mg/dL (ref 0.61–1.24)
GFR, Estimated: >60 mL/min (ref >60)
Glucose, Bld: 104 mg/dL — ABNORMAL HIGH (ref 70–99)
Potassium: 4.3 mmol/L (ref 3.5–5.1)
Sodium: 136 mmol/L (ref 135–145)
Total Bilirubin: 0.6 mg/dL (ref 0.3–1.2)
Total Protein: 7 g/dL (ref 6.5–8.1)

## 2021-11-23 LAB — CBC WITH DIFFERENTIAL/PLATELET
Abs Immature Granulocytes: 0.02 10*3/uL (ref 0.00–0.07)
Basophils Absolute: 0.1 10*3/uL (ref 0.0–0.1)
Basophils Relative: 1 %
Eosinophils Absolute: 0.2 10*3/uL (ref 0.0–0.5)
Eosinophils Relative: 3 %
HCT: 42.1 % (ref 39.0–52.0)
Hemoglobin: 14.2 g/dL (ref 13.0–17.0)
Immature Granulocytes: 0 %
Lymphocytes Relative: 26 %
Lymphs Abs: 1.8 10*3/uL (ref 0.7–4.0)
MCH: 32.1 pg (ref 26.0–34.0)
MCHC: 33.7 g/dL (ref 30.0–36.0)
MCV: 95.2 fL (ref 80.0–100.0)
Monocytes Absolute: 0.6 10*3/uL (ref 0.1–1.0)
Monocytes Relative: 9 %
Neutro Abs: 4.1 10*3/uL (ref 1.7–7.7)
Neutrophils Relative %: 61 %
Platelets: 241 10*3/uL (ref 150–400)
RBC: 4.42 MIL/uL (ref 4.22–5.81)
RDW: 13 % (ref 11.5–15.5)
WBC: 6.8 10*3/uL (ref 4.0–10.5)
nRBC: 0 % (ref 0.0–0.2)

## 2021-11-23 MED ORDER — METOPROLOL SUCCINATE ER 25 MG PO TB24: 25.0000 mg | ORAL_TABLET | Freq: Every day | ORAL | 3 refills | Status: DC

## 2021-11-23 MED ORDER — IOHEXOL 300 MG/ML  SOLN
75.0000 mL | Freq: Once | INTRAMUSCULAR | Status: AC | PRN
  Administered 2021-11-23: 75 mL via INTRAVENOUS

## 2021-11-23 NOTE — Telephone Encounter (Signed)
*  STAT* If patient is at the pharmacy, call can be transferred to refill team.   1. Which medications need to be refilled? (please list name of each medication and dose if known) metoprolol succinate (TOPROL XL) 25 MG 24 hr tablet  2. Which pharmacy/location (including street and city if local pharmacy) is medication to be sent to? [place], [address]  3. Do they need a 30 day or 90 day supply? 90 day   Patient has two pills left.

## 2021-11-24 DIAGNOSIS — Z79891 Long term (current) use of opiate analgesic: Secondary | ICD-10-CM | POA: Diagnosis not present

## 2021-11-24 DIAGNOSIS — M4726 Other spondylosis with radiculopathy, lumbar region: Secondary | ICD-10-CM | POA: Diagnosis not present

## 2021-11-24 DIAGNOSIS — M48061 Spinal stenosis, lumbar region without neurogenic claudication: Secondary | ICD-10-CM | POA: Diagnosis not present

## 2021-11-24 DIAGNOSIS — M5136 Other intervertebral disc degeneration, lumbar region: Secondary | ICD-10-CM | POA: Diagnosis not present

## 2021-11-24 DIAGNOSIS — G894 Chronic pain syndrome: Secondary | ICD-10-CM | POA: Diagnosis not present

## 2021-11-30 ENCOUNTER — Inpatient Hospital Stay (HOSPITAL_BASED_OUTPATIENT_CLINIC_OR_DEPARTMENT_OTHER): Payer: Medicare HMO | Admitting: Hematology

## 2021-11-30 VITALS — BP 101/82 | HR 56 | Temp 97.4°F | Resp 18 | Ht 70.0 in | Wt 128.9 lb

## 2021-11-30 DIAGNOSIS — I255 Ischemic cardiomyopathy: Secondary | ICD-10-CM | POA: Diagnosis not present

## 2021-11-30 DIAGNOSIS — K219 Gastro-esophageal reflux disease without esophagitis: Secondary | ICD-10-CM | POA: Diagnosis not present

## 2021-11-30 DIAGNOSIS — C3492 Malignant neoplasm of unspecified part of left bronchus or lung: Secondary | ICD-10-CM

## 2021-11-30 DIAGNOSIS — I252 Old myocardial infarction: Secondary | ICD-10-CM | POA: Diagnosis not present

## 2021-11-30 DIAGNOSIS — Z801 Family history of malignant neoplasm of trachea, bronchus and lung: Secondary | ICD-10-CM | POA: Diagnosis not present

## 2021-11-30 DIAGNOSIS — Z7951 Long term (current) use of inhaled steroids: Secondary | ICD-10-CM | POA: Diagnosis not present

## 2021-11-30 DIAGNOSIS — E785 Hyperlipidemia, unspecified: Secondary | ICD-10-CM | POA: Diagnosis not present

## 2021-11-30 DIAGNOSIS — F17210 Nicotine dependence, cigarettes, uncomplicated: Secondary | ICD-10-CM | POA: Diagnosis not present

## 2021-11-30 DIAGNOSIS — Z7902 Long term (current) use of antithrombotics/antiplatelets: Secondary | ICD-10-CM | POA: Diagnosis not present

## 2021-11-30 DIAGNOSIS — Z79890 Hormone replacement therapy (postmenopausal): Secondary | ICD-10-CM | POA: Diagnosis not present

## 2021-11-30 DIAGNOSIS — I700 Atherosclerosis of aorta: Secondary | ICD-10-CM | POA: Diagnosis not present

## 2021-11-30 DIAGNOSIS — C3432 Malignant neoplasm of lower lobe, left bronchus or lung: Secondary | ICD-10-CM | POA: Diagnosis not present

## 2021-11-30 DIAGNOSIS — J439 Emphysema, unspecified: Secondary | ICD-10-CM | POA: Diagnosis not present

## 2021-11-30 DIAGNOSIS — I2510 Atherosclerotic heart disease of native coronary artery without angina pectoris: Secondary | ICD-10-CM | POA: Diagnosis not present

## 2021-11-30 DIAGNOSIS — Z87442 Personal history of urinary calculi: Secondary | ICD-10-CM | POA: Diagnosis not present

## 2021-11-30 DIAGNOSIS — Z79899 Other long term (current) drug therapy: Secondary | ICD-10-CM | POA: Diagnosis not present

## 2021-11-30 NOTE — Progress Notes (Signed)
[name] Graham, [place] [id number]   CLINIC:  Medical Oncology/Hematology  PCP:  Lysbeth Penner, [place] [address] / [place] [address] [phone]   REASON FOR VISIT:  Follow-up for left squamous cell lung cancer  PRIOR THERAPY: Left lung SBRT 50 Gy in 5 fractions from 04/14/2020 to 04/24/2020  NGS Results: not done  CURRENT THERAPY: surveillance  BRIEF ONCOLOGIC HISTORY:  Oncology History  Squamous cell lung cancer, left ([place])  12/08/2016 Initial Diagnosis   Squamous cell lung cancer, left ([place])   11/12/2020 Imaging   1. LEFT infrahilar lesion of similar size compared to most recent study with evolving post treatment changes and diminished basilar nodularity that was seen previously. 2. Basilar nodularity discussed above likely post inflammatory or infectious, attention on follow-up. No new or progressive findings. 3. Diminishing size of mediastinal lymph nodes and particularly the dominant node anterior to the carina. 4. Three-vessel coronary artery disease. 5. Emphysema and aortic atherosclerosis.       CANCER STAGING: Cancer Staging  No matching staging information was found for the patient.  INTERVAL HISTORY:  [name], a 86 y.o. male, returns for routine follow-up of his left squamous cell lung cancer. [name] Graham was last seen on 07/30/2021.   Today he reports feeling good. He continues to smoke 1 ppd.   REVIEW OF SYSTEMS:  Review of Systems  Constitutional:  Negative for appetite change and fatigue.  Respiratory:  Positive for shortness of breath.   Genitourinary:  Positive for frequency.   Neurological:  Positive for dizziness, headaches and numbness.  All other systems reviewed and are negative.   PAST MEDICAL/SURGICAL HISTORY:  Past Medical History:  Diagnosis Date   Arthritis    COPD (chronic obstructive pulmonary disease) ([place])    Coronary atherosclerosis of native coronary artery    BMS circ and  PTCA PDA 2007, DES RCA 10/09, DES LAD 2/12   Essential hypertension    GERD (gastroesophageal reflux disease)    Hearing loss    Heart attack ([place])    x 3 (latest 2009)   History of kidney stones    Hyperlipidemia    Ischemic cardiomyopathy    NSTEMI (non-ST elevated myocardial infarction) ([place])    2007   Squamous cell lung cancer [facility])    Past Surgical History:  Procedure Laterality Date   ANGIOPLASTY     stent   BRONCHIAL BIOPSY  02/19/2020   Procedure: BRONCHIAL BIOPSIES;  Surgeon: Collene Gobble, MD;  Location: [facility];  Service: Pulmonary;;   BRONCHIAL BRUSHINGS  02/19/2020   Procedure: BRONCHIAL BRUSHINGS;  Surgeon: Collene Gobble, MD;  Location: [facility] ENDOSCOPY;  Service: Pulmonary;;   BRONCHIAL NEEDLE ASPIRATION BIOPSY  02/19/2020   Procedure: BRONCHIAL NEEDLE ASPIRATION BIOPSIES;  Surgeon: Collene Gobble, MD;  Location: [facility] ENDOSCOPY;  Service: Pulmonary;;   COLONOSCOPY     FIDUCIAL MARKER PLACEMENT  02/19/2020   Procedure: FIDUCIAL MARKER PLACEMENT;  Surgeon: Collene Gobble, MD;  Location: [facility] ENDOSCOPY;  Service: Pulmonary;;   HERNIA REPAIR     LIPOMA EXCISION Right    hip/buttox   TONSILLECTOMY     as a child   VIDEO BRONCHOSCOPY WITH ENDOBRONCHIAL NAVIGATION N/A 02/19/2020   Procedure: VIDEO BRONCHOSCOPY WITH ENDOBRONCHIAL NAVIGATION;  Surgeon: Collene Gobble, MD;  Location: [facility] ENDOSCOPY;  Service: Pulmonary;  Laterality: N/A;    SOCIAL HISTORY:  Social History   Socioeconomic History   Marital status: Divorced  Spouse name: Not on file   Number of children: Not on file   Years of education: Not on file   Highest education level: Not on file  Occupational History   Occupation: Retired  Tobacco Use   Smoking status: Every Day    Packs/day: 0.50    Years: 63.00    Total pack years: 31.50    Types: Cigarettes    Start date: 06/15/1953   Smokeless tobacco: Never   Tobacco comments:    10 cigarettes a day [facility] 08/26/2021  Vaping Use   Vaping Use:  Never used  Substance and Sexual Activity   Alcohol use: No    Alcohol/week: 0.0 standard drinks of alcohol   Drug use: No   Sexual activity: Not Currently  Other Topics Concern   Not on file  Social History Narrative   Not on file   Social Determinants of Health   Financial Resource Strain: Low Risk  (02/28/2020)   Overall Financial Resource Strain (CARDIA)    Difficulty of Paying Living Expenses: Not hard at all  Food Insecurity: No Food Insecurity (02/28/2020)   Hunger Vital Sign    Worried About Running Out of Food in the Last Year: Never true    [place] in the Last Year: Never true  Transportation Needs: No Transportation Needs (02/28/2020)   PRAPARE - Hydrologist (Medical): No    Lack of Transportation (Non-Medical): No  Physical Activity: Insufficiently Active (02/28/2020)   Exercise Vital Sign    Days of Exercise per Week: 7 days    Minutes of Exercise per Session: 10 min  Stress: Stress Concern Present (02/28/2020)   [place]    Feeling of Stress : Very much  Social Connections: Socially Isolated (02/28/2020)   Social Connection and Isolation Panel [NHANES]    Frequency of Communication with Friends and Family: More than three times a week    Frequency of Social Gatherings with Friends and Family: More than three times a week    Attends Religious Services: Never    Marine scientist or Organizations: No    Attends Archivist Meetings: Never    Marital Status: Divorced  Human resources officer Violence: Not At Risk (02/28/2020)   Humiliation, Afraid, Rape, and Kick questionnaire    Fear of Current or Ex-Partner: No    Emotionally Abused: No    Physically Abused: No    Sexually Abused: No    FAMILY HISTORY:  Family History  Problem Relation Age of Onset   Throat cancer Father    Heart attack Mother    Alzheimer's disease Mother     CURRENT  MEDICATIONS:  Current Outpatient Medications  Medication Sig Dispense Refill   albuterol (VENTOLIN HFA) 108 (90 Base) MCG/ACT inhaler Inhale into the lungs every 6 (six) hours as needed for wheezing or shortness of breath.     amLODipine (NORVASC) 5 MG tablet Take 1 tablet by mouth once daily 90 tablet 1   aspirin 325 MG tablet Take by mouth.     budesonide-formoterol (SYMBICORT) 80-4.5 MCG/ACT inhaler 2 puffs on days prior to heavy activity 1 each 11   clopidogrel (PLAVIX) 75 MG tablet TAKE 1 TABLET BY MOUTH ONCE DAILY . APPOINTMENT REQUIRED FOR FUTURE REFILLS 90 tablet 3   Ensure (ENSURE) Take 237 mLs by mouth 2 (two) times daily.     famotidine (PEPCID) 20 MG tablet One after supper 30  tablet 11   fluticasone (FLONASE) 50 MCG/ACT nasal spray Place into both nostrils 3 (three) times daily as needed for allergies or rhinitis.     ipratropium (ATROVENT) 0.06 % nasal spray Place 2 sprays into both nostrils 3 (three) times daily.     levothyroxine (SYNTHROID) 50 MCG tablet Take 1 tablet (50 mcg total) by mouth daily before breakfast. 30 tablet 0   metoprolol succinate (TOPROL XL) 25 MG 24 hr tablet Take 1 tablet (25 mg total) by mouth daily. 90 tablet 3   Naphazoline HCl (CLEAR EYES OP) Place 1 drop into both eyes daily as needed (irritation / redness).     nitroGLYCERIN (NITROSTAT) 0.4 MG SL tablet DISSOLVE ONE TABLET UNDER THE TONGUE EVERY 5 MINUTES AS NEEDED FOR CHEST PAIN.  DO NOT EXCEED A TOTAL OF 3 DOSES IN 15 MINUTES 25 tablet 3   olmesartan (BENICAR) 20 MG tablet Take 1 tablet (20 mg total) by mouth daily. 90 tablet 2   OXYGEN Inhale into the lungs.     pantoprazole (PROTONIX) 40 MG tablet Take 1 tablet (40 mg total) by mouth daily. Take 30-60 min before first meal of the day 30 tablet 2   simvastatin (ZOCOR) 80 MG tablet TAKE 1 TABLET BY MOUTH AT BEDTIME 90 tablet 3   No current facility-administered medications for this visit.    ALLERGIES:  Allergies  Allergen Reactions   Entresto  [Sacubitril-Valsartan] Itching    PHYSICAL EXAM:  Performance status (ECOG): 2 - Symptomatic, <50% confined to bed  There were no vitals filed for this visit. Wt Readings from Last 3 Encounters:  10/21/21 130 lb 3.2 oz (59.1 kg)  10/07/21 131 lb 9.6 oz (59.7 kg)  10/02/21 129 lb 12.8 oz (58.9 kg)   Physical Exam Vitals reviewed.  Constitutional:      Appearance: Normal appearance.  Cardiovascular:     Rate and Rhythm: Normal rate and regular rhythm.     Pulses: Normal pulses.     Heart sounds: Normal heart sounds.  Pulmonary:     Effort: Pulmonary effort is normal.     Breath sounds: Normal breath sounds.  Neurological:     General: No focal deficit present.     Mental Status: He is alert and oriented to person, place, and time.  Psychiatric:        Mood and Affect: Mood normal.        Behavior: Behavior normal.      LABORATORY DATA:  I have reviewed the labs as listed.     Latest Ref Rng & Units 11/23/2021    9:48 AM 10/02/2021    9:55 AM 03/18/2021   11:51 AM  CBC  WBC 4.0 - 10.5 K/uL 6.8  6.8  6.8   Hemoglobin 13.0 - 17.0 g/dL 14.2  16.0  15.5   Hematocrit 39.0 - 52.0 % 42.1  47.6  45.7   Platelets 150 - 400 K/uL 241  235  209       Latest Ref Rng & Units 11/23/2021    9:48 AM 10/21/2021    1:57 PM 10/02/2021    9:55 AM  CMP  Glucose 70 - 99 mg/dL 104  93  107   BUN 8 - 23 mg/dL 16  13  23    Creatinine 0.61 - 1.24 mg/dL 1.09  1.05  1.25   Sodium 135 - 145 mmol/L 136  136  137   Potassium 3.5 - 5.1 mmol/L 4.3  5.0  5.6   Chloride  98 - 111 mmol/L 106  99  100   CO2 22 - 32 mmol/L 25  21  21    Calcium 8.9 - 10.3 mg/dL 8.8  9.1  9.9   Total Protein 6.5 - 8.1 g/dL 7.0     Total Bilirubin 0.3 - 1.2 mg/dL 0.6     Alkaline Phos 38 - 126 U/L 55     AST 15 - 41 U/L 15     ALT 0 - 44 U/L 9       DIAGNOSTIC IMAGING:  I have independently reviewed the scans and discussed with the patient. CT Chest W Contrast  Result Date: 11/23/2021 CLINICAL DATA:  Non-small cell  lung cancer. Follow-up LEFT squamous cell carcinoma lung. * Tracking Code: BO * EXAM: CT CHEST WITH CONTRAST TECHNIQUE: Multidetector CT imaging of the chest was performed during intravenous contrast administration. RADIATION DOSE REDUCTION: This exam was performed according to the departmental dose-optimization program which includes automated exposure control, adjustment of the mA and/or kV according to patient size and/or use of iterative reconstruction technique. CONTRAST:  1m OMNIPAQUE IOHEXOL 300 MG/ML  SOLN COMPARISON:  None Available. FINDINGS: Cardiovascular: Coronary artery calcification and aortic atherosclerotic calcification. Mediastinum/Nodes: No axillary or supraclavicular adenopathy. No mediastinal or hilar adenopathy. No pericardial fluid. Esophagus normal. Lungs/Pleura: LEFT infrahilar lower lobe lesion measures 2.4 x 1.9 cm compared to 2.4 by 1.9 cm on PET-CT scan 07/23/2021. Fiducial marker adjacent to the nodularity. Biapical pleuroparenchymal thickening and scarring is unchanged. Upper lobe centrilobular emphysema. No new nodularity. Upper Abdomen: Limited view of the liver, kidneys, pancreas are unremarkable. Normal adrenal glands. Musculoskeletal: No aggressive osseous lesion. IMPRESSION: 1. No significant change in LEFT lower lobe infrahilar nodularity adjacent to radiation fiducial markers. Recommend continued surveillance. 2. No mediastinal lymphadenopathy. 3. Coronary artery calcification and Aortic Atherosclerosis (ICD10-I70.0). 4. Severe emphysema.  Emphysema (ICD10-J43.9). Electronically Signed   By: SSuzy BouchardM.D.   On: 11/23/2021 15:52     ASSESSMENT:  1.  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 -Patient placed on O2 nasal cannula in the last 3 weeks for drop in sats on exertion. -PET scan on 01/28/2020 shows 2.6 cm left lower lobe nodule SUV 10.1.  Focal hypermetabolic  them in the left hilum identified without discrete lymph node visible on CT.  10 mm short axis precarinal lymph node with low-level SUV 2.9.  7 mm short axis right hilar node with SUV 3.8. -Navigational bronchoscopy and biopsy of the left lower lobe lung lesion consistent with squamous cell carcinoma. -IMRT to the left lower lung mass from 04/14/2020 through 04/24/2020, 50 Gy/ 5 fractions. - NGS testing on sample from 02/19/2020: Negative for BRAF V600 E.  MMR proficient.  PD-L1 22 C3, 28-8 and SP142 0%.  TRK A/B/C-. - Guardant360: Unconventional EGFR mutation -PET scan on 04/09/2021 showed left lower lobe lung mass measuring 2.1 x 2.2 cm, SUV 5.7.  Hilar hypermetabolic without well-defined adenopathy. - He was evaluated by Dr. KSondra Comeand was felt to be not a candidate for radiation or SBRT.    2.  Social/family history: -2 pack/day smoker for 68 years.  Currently smokes 4 cigarettes/day. 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 -Father had throat cancer.   PLAN:  1.  Clinical T1c N0 M0 squamous cell carcinoma of left lung lower lobe: - He is continuing to smoke  1 pack of cigarettes per day. - Reviewed labs from 11/23/2021 which was within normal limits. - CT chest (11/23/2021): Left infrahilar lower lobe lesion measures 2.4 x 1.9 cm, similar to PET scan from 07/23/2021.  No mediastinal adenopathy. - As he did not have any progressive disease, I have recommended observation with repeat CT chest in 4 months. - I have counseled for smoking cessation. - If there is any progression we will consider palliative chemoimmunotherapy. 9 LA regimen is an option.      Orders placed this encounter:  No orders of the defined types were placed in this encounter.    Derek Jack, MD [place] [phone]   I, Thana Ates, am acting as a scribe for Dr. Derek Jack.  I, Derek Jack MD, have reviewed the  above documentation for accuracy and completeness, and I agree with the above.

## 2021-11-30 NOTE — Patient Instructions (Signed)
[place] at [facility] Discharge Instructions   You were seen and examined today by Dr. Delton Coombes.  Your CT scan results were stable.   We will see you back in 4 months. We will repeat a scan prior to your next visit.    Thank you for choosing [place] at [facility] to provide your oncology and hematology care.  To afford each patient quality time with our provider, please arrive at least 15 minutes before your scheduled appointment time.   If you have a lab appointment with the [facility] please come in thru the Main Entrance and check in at the main information desk.  You need to re-schedule your appointment should you arrive 10 or more minutes late.  We strive to give you quality time with our providers, and arriving late affects you and other patients whose appointments are after yours.  Also, if you no show three or more times for appointments you may be dismissed from the clinic at the providers discretion.     Again, thank you for choosing [facility].  Our hope is that these requests will decrease the amount of time that you wait before being seen by our physicians.       _____________________________________________________________  Should you have questions after your visit to [facility], please contact our office at [phone] and follow the prompts.  Our office hours are 8:00 a.m. and 4:30 p.m. Monday - Friday.  Please note that voicemails left after 4:00 p.m. may not be returned until the following business day.  We are closed weekends and major holidays.  You do have access to a nurse 24-7, just call the main number to the clinic [phone] and do not press any options, hold on the line and a nurse will answer the phone.    For prescription refill requests, have your pharmacy contact our office and allow 72 hours.    Due to Covid, you will need to wear a mask upon entering the hospital. If  you do not have a mask, a mask will be given to you at the Main Entrance upon arrival. For doctor visits, patients may have 1 support person age 26 or older with them. For treatment visits, patients can not have anyone with them due to social distancing guidelines and our immunocompromised population.

## 2021-12-05 DIAGNOSIS — I2510 Atherosclerotic heart disease of native coronary artery without angina pectoris: Secondary | ICD-10-CM | POA: Diagnosis not present

## 2021-12-05 DIAGNOSIS — J449 Chronic obstructive pulmonary disease, unspecified: Secondary | ICD-10-CM | POA: Diagnosis not present

## 2021-12-08 ENCOUNTER — Other Ambulatory Visit: Payer: Self-pay | Admitting: Internal Medicine

## 2021-12-29 ENCOUNTER — Other Ambulatory Visit: Payer: Self-pay | Admitting: Internal Medicine

## 2022-01-05 DIAGNOSIS — J449 Chronic obstructive pulmonary disease, unspecified: Secondary | ICD-10-CM | POA: Diagnosis not present

## 2022-01-05 DIAGNOSIS — I2510 Atherosclerotic heart disease of native coronary artery without angina pectoris: Secondary | ICD-10-CM | POA: Diagnosis not present

## 2022-01-21 ENCOUNTER — Telehealth: Payer: Self-pay | Admitting: Internal Medicine

## 2022-01-21 ENCOUNTER — Other Ambulatory Visit: Payer: Self-pay | Admitting: Internal Medicine

## 2022-01-21 MED ORDER — LEVOTHYROXINE SODIUM 50 MCG PO TABS: 50.0000 ug | ORAL_TABLET | Freq: Every day | ORAL | 0 refills | Status: DC

## 2022-01-21 NOTE — Telephone Encounter (Signed)
Refill sent to [facility]. Nothing further needed

## 2022-02-04 DIAGNOSIS — J449 Chronic obstructive pulmonary disease, unspecified: Secondary | ICD-10-CM | POA: Diagnosis not present

## 2022-02-04 DIAGNOSIS — I2510 Atherosclerotic heart disease of native coronary artery without angina pectoris: Secondary | ICD-10-CM | POA: Diagnosis not present

## 2022-02-15 DIAGNOSIS — I10 Essential (primary) hypertension: Secondary | ICD-10-CM | POA: Diagnosis not present

## 2022-02-15 DIAGNOSIS — H35033 Hypertensive retinopathy, bilateral: Secondary | ICD-10-CM | POA: Diagnosis not present

## 2022-02-15 DIAGNOSIS — H2513 Age-related nuclear cataract, bilateral: Secondary | ICD-10-CM | POA: Diagnosis not present

## 2022-02-16 ENCOUNTER — Other Ambulatory Visit: Payer: Self-pay | Admitting: Internal Medicine

## 2022-02-23 DIAGNOSIS — H25812 Combined forms of age-related cataract, left eye: Secondary | ICD-10-CM | POA: Diagnosis not present

## 2022-02-23 DIAGNOSIS — H25811 Combined forms of age-related cataract, right eye: Secondary | ICD-10-CM | POA: Diagnosis not present

## 2022-02-25 ENCOUNTER — Telehealth: Payer: Self-pay | Admitting: Cardiology

## 2022-02-25 NOTE — Telephone Encounter (Signed)
   Patient Name: [name] Graham  DOB: 1935/07/03 MRN: [id number]  Primary Cardiologist: Rozann Lesches, MD  Chart reviewed as part of pre-operative protocol coverage. Cataract extractions are recognized in guidelines as low risk surgeries that do not typically require specific preoperative testing or holding of blood thinner therapy. Therefore, given past medical history and time since last visit, based on ACC/AHA guidelines, [name] would be at acceptable risk for the planned procedure without further cardiovascular testing.   I will route this recommendation to the requesting party via Epic fax function and remove from pre-op pool.  Please call with questions.  Loel Dubonnet, NP 02/25/2022, 5:01 PM

## 2022-02-25 NOTE — Telephone Encounter (Signed)
   Pre-operative Risk Assessment    Patient Name: [name] Graham  DOB: 05-15-1935 MRN: [id number] is       Request for Surgical Clearance    Procedure:   Cataract Extraction by PE, IOL- left, then rights  Date of Surgery:  Clearance 03/17/22                                 Surgeon:  Dr. Antionette Fairy [place]  Surgeon's Group or Practice Name:  [facility]  Phone number:  [phone] Fax number:  [phone]   Type of Clearance Requested:   - Medical    Type of Anesthesia:  Local    Additional requests/questions:   please let office know if it is safe to proceed with cataract surgery  Signed, Desma Paganini   02/25/2022, 4:29 PM

## 2022-03-05 DIAGNOSIS — I2510 Atherosclerotic heart disease of native coronary artery without angina pectoris: Secondary | ICD-10-CM | POA: Diagnosis not present

## 2022-03-05 DIAGNOSIS — J439 Emphysema, unspecified: Secondary | ICD-10-CM | POA: Diagnosis not present

## 2022-03-05 DIAGNOSIS — I252 Old myocardial infarction: Secondary | ICD-10-CM | POA: Diagnosis not present

## 2022-03-05 DIAGNOSIS — M1990 Unspecified osteoarthritis, unspecified site: Secondary | ICD-10-CM | POA: Diagnosis not present

## 2022-03-05 DIAGNOSIS — I110 Hypertensive heart disease with heart failure: Secondary | ICD-10-CM | POA: Diagnosis not present

## 2022-03-05 DIAGNOSIS — I509 Heart failure, unspecified: Secondary | ICD-10-CM | POA: Diagnosis not present

## 2022-03-05 DIAGNOSIS — C3490 Malignant neoplasm of unspecified part of unspecified bronchus or lung: Secondary | ICD-10-CM | POA: Diagnosis not present

## 2022-03-05 DIAGNOSIS — I471 Supraventricular tachycardia, unspecified: Secondary | ICD-10-CM | POA: Diagnosis not present

## 2022-03-05 DIAGNOSIS — E039 Hypothyroidism, unspecified: Secondary | ICD-10-CM | POA: Diagnosis not present

## 2022-03-05 DIAGNOSIS — F17200 Nicotine dependence, unspecified, uncomplicated: Secondary | ICD-10-CM | POA: Diagnosis not present

## 2022-03-05 DIAGNOSIS — E785 Hyperlipidemia, unspecified: Secondary | ICD-10-CM | POA: Diagnosis not present

## 2022-03-05 DIAGNOSIS — K219 Gastro-esophageal reflux disease without esophagitis: Secondary | ICD-10-CM | POA: Diagnosis not present

## 2022-03-07 DIAGNOSIS — J449 Chronic obstructive pulmonary disease, unspecified: Secondary | ICD-10-CM | POA: Diagnosis not present

## 2022-03-07 DIAGNOSIS — I2510 Atherosclerotic heart disease of native coronary artery without angina pectoris: Secondary | ICD-10-CM | POA: Diagnosis not present

## 2022-03-13 IMAGING — CT NM PET TUM IMG INITIAL (PI) SKULL BASE T - THIGH
1 of 3 series · 2 of 25 positions shown · non-contrast
Comparison: CT chest 01/10/2020

CLINICAL DATA: Initial treatment strategy for pulmonary nodule.

EXAM:
NUCLEAR MEDICINE PET SKULL BASE TO THIGH
TECHNIQUE: 6.6 mCi F-18 FDG was injected intravenously. Full-ring PET imaging
was performed from the skull base to thigh after the radiotracer. CT
data was obtained and used for attenuation correction and anatomic
localization.
Fasting blood glucose: 107 mg/dl

[Series 3: ct wb fusion · axial · 5.0mm · 0.98mm/px · z∈[-754,-632]mm · 2 of 387 slices shown]
[im 338/387  brain]
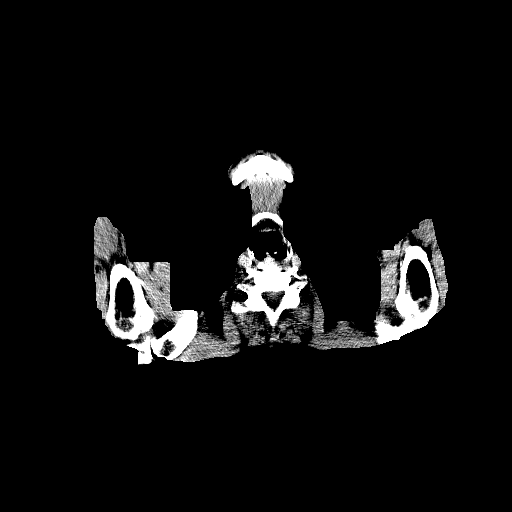
[im 387/387  brain]
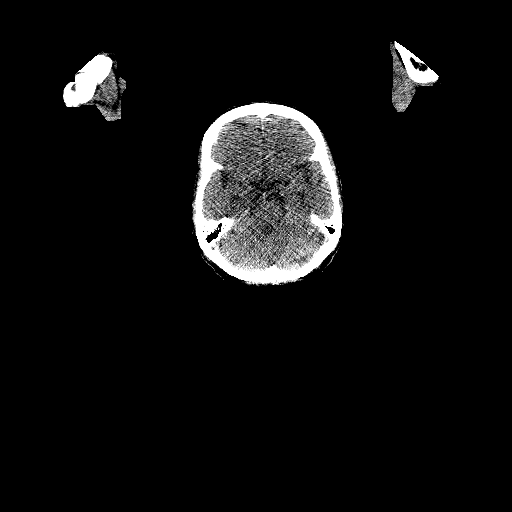

[2 of 25 positions shown; findings below may reference images not displayed]

FINDINGS: Mediastinal blood pool activity: SUV max

Liver activity: SUV max NA

NECK: No hypermetabolic lymph nodes in the neck.

Incidental CT findings: none

CHEST: 2.6 cm left lower lobe infrahilar nodule is markedly
hypermetabolic with SUV max = 10.1. Focal hypermetabolism in the
left hilum identified (SIRT max 3.8) without discrete lymph node
visible on noncontrast CT imaging. 10 mm short axis precarinal node
shows low level FDG uptake with SUV max = 2.9. 7 mm short axis right
hilar node visible on image 112/series 3 shows low level
hypermetabolism with SUV max = 3.8.

Relatively symmetric biapical pleuroparenchymal scarring shows low
level FDG accumulation, at background blood pool levels.

Incidental CT findings: Coronary artery calcification is evident.
Atherosclerotic calcification is noted in the wall of the thoracic
aorta. Centrilobular emphsyema noted.

ABDOMEN/PELVIS: No abnormal hypermetabolic activity within the
liver, pancreas, adrenal glands, or spleen. No hypermetabolic lymph
nodes in the abdomen or pelvis.

Incidental CT findings: Fusiform infrarenal abdominal aortic
aneurysm measures up to 3.5 cm diameter. Small left groin hernia
contains only fat.

SKELETON: No focal hypermetabolic activity to suggest skeletal
metastasis.

Incidental CT findings: none
IMPRESSION: Markedly hypermetabolic infrahilar left lower lobe pulmonary nodule
consistent with primary bronchogenic neoplasm. Focal hypermetabolism
in the left hilum is concerning for metastatic disease although no
discrete lymph node is evident on today's noncontrast CT imaging.
Small precarinal and right hilar lymph nodes show low level
hypermetabolism and metastatic involvement cannot be excluded.

No evidence for distant hypermetabolic metastatic disease in the
neck, abdomen, or pelvis.

Aortic Atherosclerois (OKIUA-170.0) with 3.5 cm infrarenal abdominal
aortic aneurysm. Recommend follow-up every 2 years. This
recommendation follows ACR consensus guidelines: White Paper of the
ACR Incidental Findings Committee II on Vascular Findings. [HOSPITAL] 6179; [DATE].

Emphysema. (OKIUA-6GH.P)

## 2022-03-17 DIAGNOSIS — H269 Unspecified cataract: Secondary | ICD-10-CM | POA: Diagnosis not present

## 2022-03-17 DIAGNOSIS — H25812 Combined forms of age-related cataract, left eye: Secondary | ICD-10-CM | POA: Diagnosis not present

## 2022-03-27 ENCOUNTER — Other Ambulatory Visit (HOSPITAL_COMMUNITY): Payer: Self-pay | Admitting: Hematology and Oncology

## 2022-03-29 ENCOUNTER — Telehealth: Payer: Self-pay | Admitting: Cardiology

## 2022-03-29 MED ORDER — AMLODIPINE BESYLATE 5 MG PO TABS: 5.0000 mg | ORAL_TABLET | Freq: Every day | ORAL | 1 refills | Status: DC

## 2022-03-29 NOTE — Telephone Encounter (Signed)
*  STAT* If patient is at the pharmacy, call can be transferred to refill team.   1. Which medications need to be refilled? (please list name of each medication and dose if known)   amLODipine (NORVASC) 5 MG tablet   2. Which pharmacy/location (including street and city if local pharmacy) is medication to be sent to?  [place], [address]   3. Do they need a 30 day or 90 day supply?  90 day  Patient stated he has 2 tablets left.

## 2022-03-31 ENCOUNTER — Other Ambulatory Visit: Payer: Self-pay | Admitting: Internal Medicine

## 2022-03-31 ENCOUNTER — Other Ambulatory Visit (HOSPITAL_COMMUNITY): Payer: Self-pay | Admitting: Hematology and Oncology

## 2022-04-01 ENCOUNTER — Other Ambulatory Visit (HOSPITAL_COMMUNITY): Payer: Medicare HMO

## 2022-04-01 ENCOUNTER — Ambulatory Visit (HOSPITAL_COMMUNITY): Payer: Medicare HMO

## 2022-04-02 ENCOUNTER — Other Ambulatory Visit (HOSPITAL_COMMUNITY): Payer: Self-pay | Admitting: Hematology

## 2022-04-06 DIAGNOSIS — I2510 Atherosclerotic heart disease of native coronary artery without angina pectoris: Secondary | ICD-10-CM | POA: Diagnosis not present

## 2022-04-06 DIAGNOSIS — J449 Chronic obstructive pulmonary disease, unspecified: Secondary | ICD-10-CM | POA: Diagnosis not present

## 2022-04-08 ENCOUNTER — Ambulatory Visit: Payer: Medicare HMO | Admitting: Hematology

## 2022-04-08 ENCOUNTER — Other Ambulatory Visit: Payer: Self-pay | Admitting: Internal Medicine

## 2022-04-19 ENCOUNTER — Encounter: Payer: Self-pay | Admitting: Internal Medicine

## 2022-04-19 ENCOUNTER — Ambulatory Visit (INDEPENDENT_AMBULATORY_CARE_PROVIDER_SITE_OTHER): Payer: Medicare HMO | Admitting: Internal Medicine

## 2022-04-19 VITALS — BP 130/88 | HR 82 | Temp 97.6°F | Ht 70.0 in | Wt 131.8 lb

## 2022-04-19 DIAGNOSIS — J449 Chronic obstructive pulmonary disease, unspecified: Secondary | ICD-10-CM

## 2022-04-19 DIAGNOSIS — E039 Hypothyroidism, unspecified: Secondary | ICD-10-CM

## 2022-04-19 DIAGNOSIS — F17210 Nicotine dependence, cigarettes, uncomplicated: Secondary | ICD-10-CM | POA: Diagnosis not present

## 2022-04-19 NOTE — Patient Instructions (Addendum)
Keep walking to your mailbox and check your 02 saturations at the point you are the most short of breath and see if it's staying above 90% - if not, walk slower or wear your 0xygen to maintain this level of 02 saturation   Also  Ok to try symbicort 80 up to 4 puffs in 24 hours   1st try 2 pffs x  15 min before an activity (on alternating days)  that you know would usually make you short of breath and see if it makes any difference and if makes none then don't take albuterol after activity unless you can't catch your breath as this means it's the resting that helps, not the symbicort  If you really find symbicort helps then change to Take 2 puffs first thing in am and then another 2 puffs about 12 hours later on a regular basis (still total of 4 puffs daily but taken on a more regular basis)   Please schedule a follow up visit in 6 months but call sooner if needed
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
## 2022-04-19 NOTE — Progress Notes (Unsigned)
Cardiology Office Note  Date: 04/20/2022   ID: [name] Graham, DOB [birth year], MRN [id number]  PCP:  Lysbeth Penner, FNP  Cardiologist:  Rozann Lesches, MD Electrophysiologist:  None   Chief Complaint  Patient presents with   Cardiac follow-up    History of Present Illness: [name] Graham is an 86 y.o. male last seen in June.  He is here for a routine visit.  Reports no angina or nitroglycerin use, stable NYHA class II dyspnea with typical activities.  No other major health changes, he did undergo cataract surgery recently.  Still following with Pulmonary and Oncology.  We went over his medications today.  Discussed switching Zocor to Crestor to reduce potential interaction with Norvasc (although he has tolerated the combination well over time).  His last LDL was around 50.   Past Medical History:  Diagnosis Date   Arthritis    COPD (chronic obstructive pulmonary disease) ([place])    Coronary atherosclerosis of native coronary artery    BMS circ and PTCA PDA 2007, DES RCA 10/09, DES LAD 2/12   Essential hypertension    GERD (gastroesophageal reflux disease)    Hearing loss    Heart attack ([place])    x 3 (latest 2009)   History of kidney stones    Hyperlipidemia    Ischemic cardiomyopathy    NSTEMI (non-ST elevated myocardial infarction) ([place])    2007   Squamous cell lung cancer [facility])     Past Surgical History:  Procedure Laterality Date   ANGIOPLASTY     stent   BRONCHIAL BIOPSY  02/19/2020   Procedure: BRONCHIAL BIOPSIES;  Surgeon: Collene Gobble, MD;  Location: [facility];  Service: Pulmonary;;   BRONCHIAL BRUSHINGS  02/19/2020   Procedure: BRONCHIAL BRUSHINGS;  Surgeon: Collene Gobble, MD;  Location[facility] ENDOSCOPY;  Service: Pulmonary;;   BRONCHIAL NEEDLE ASPIRATION BIOPSY  02/19/2020   Procedure: BRONCHIAL NEEDLE ASPIRATION BIOPSIES;  Surgeon: Collene Gobble, MD;  Location: [facility] ENDOSCOPY;  Service: Pulmonary;;   COLONOSCOPY     FIDUCIAL MARKER  PLACEMENT  02/19/2020   Procedure: FIDUCIAL MARKER PLACEMENT;  Surgeon: Collene Gobble, MD;  Location: [facility] ENDOSCOPY;  Service: Pulmonary;;   HERNIA REPAIR     LIPOMA EXCISION Right    hip/buttox   TONSILLECTOMY     as a child   VIDEO BRONCHOSCOPY WITH ENDOBRONCHIAL NAVIGATION N/A 02/19/2020   Procedure: VIDEO BRONCHOSCOPY WITH ENDOBRONCHIAL NAVIGATION;  Surgeon: Collene Gobble, MD;  Location: [facility] ENDOSCOPY;  Service: Pulmonary;  Laterality: N/A;    Current Outpatient Medications  Medication Sig Dispense Refill   albuterol (VENTOLIN HFA) 108 (90 Base) MCG/ACT inhaler Inhale into the lungs every 6 (six) hours as needed for wheezing or shortness of breath.     amLODipine (NORVASC) 5 MG tablet Take 1 tablet (5 mg total) by mouth daily. 90 tablet 1   aspirin 325 MG tablet Take by mouth.     budesonide-formoterol (SYMBICORT) 80-4.5 MCG/ACT inhaler 2 puffs on days prior to heavy activity 1 each 11   clopidogrel (PLAVIX) 75 MG tablet TAKE 1 TABLET BY MOUTH ONCE DAILY . APPOINTMENT REQUIRED FOR FUTURE REFILLS 90 tablet 3   cyclobenzaprine (FLEXERIL) 5 MG tablet Take 1 tablet by mouth three times daily as needed for muscle spasm 90 tablet 0   famotidine (PEPCID) 20 MG tablet One after supper 30 tablet 11   HYDROcodone-acetaminophen (NORCO) 7.5-325 MG tablet Take 1 tablet by mouth 2 (two) times daily as needed.  levothyroxine (SYNTHROID) 50 MCG tablet TAKE 1 TABLET BY MOUTH ONCE DAILY BEFORE BREAKFAST 30 tablet 3   metoprolol succinate (TOPROL XL) 25 MG 24 hr tablet Take 1 tablet (25 mg total) by mouth daily. 90 tablet 3   Naphazoline HCl (CLEAR EYES OP) Place 1 drop into both eyes daily as needed (irritation / redness).     nitroGLYCERIN (NITROSTAT) 0.4 MG SL tablet DISSOLVE ONE TABLET UNDER THE TONGUE EVERY 5 MINUTES AS NEEDED FOR CHEST PAIN.  DO NOT EXCEED A TOTAL OF 3 DOSES IN 15 MINUTES 25 tablet 3   olmesartan (BENICAR) 40 MG tablet Take 40 mg by mouth daily.     OXYGEN Inhale into the  lungs.     pantoprazole (PROTONIX) 40 MG tablet TAKE 1 TABLET BY MOUTH ONCE DAILY 30 TO 60 MINUTES BEFORE FIRST MEAL OF THE DAY 90 tablet 0   rosuvastatin (CRESTOR) 20 MG tablet Take 1 tablet (20 mg total) by mouth daily. 90 tablet 3   No current facility-administered medications for this visit.   Allergies:  Entresto [sacubitril-valsartan]   ROS:  No syncope.  Physical Exam: VS:  BP 132/82   Pulse 74   Ht 5\' 10"  (1.778 m)   Wt 131 lb (59.4 kg)   SpO2 93%   BMI 18.80 kg/m , BMI Body mass index is 18.8 kg/m.  Wt Readings from Last 3 Encounters:  04/20/22 131 lb (59.4 kg)  04/19/22 131 lb 12.8 oz (59.8 kg)  11/30/21 128 lb 14.4 oz (58.5 kg)    General: Patient appears comfortable at rest. HEENT: Conjunctiva and lids normal. Neck: Supple, no elevated JVP or carotid bruits. Lungs: Clear to auscultation, nonlabored breathing at rest. Cardiac: Regular rate and rhythm, no S3, 1/6 systolic murmur. Extremities: No pitting edema.  ECG:  An ECG dated 10/07/2021 was personally reviewed today and demonstrated:  Sinus rhythm with right bundle branch block and left anterior fascicular block.  Recent Labwork: 10/02/2021: BNP 67.4 10/21/2021: TSH 9.260 11/23/2021: ALT 9; AST 15; BUN 16; Creatinine, Ser 1.09; Hemoglobin 14.2; Platelets 241; Potassium 4.3; Sodium 136   Other Studies Reviewed Today:  Cardiac monitor May 2022: ZIO XT reviewed.  14 days analyzed.  Predominant rhythm is sinus with heart rate ranging from 52 bpm up to 132 bpm and average heart rate 68 bpm.  There were rare PACs including couplets and triplets representing less than 1% total beats.  Relatively frequent PVCs were noted representing 5.2% total beats with rare couplets and triplets as well as ventricular bigeminy and trigeminy.  There were 5 brief episodes of NSVT, no longer than 5 beats observed.  No sustained ventricular events.  Several episodes of PSVT were noted, the longest of which lasted approximately 15 seconds with  average heart rate of 112.  There were no pauses.   Echocardiogram 10/31/2020:  1. Left ventricular ejection fraction, by estimation, is 40 to 45%. The  left ventricle has mildly decreased function. The left ventricle  demonstrates regional wall motion abnormalities (see scoring  diagram/findings for description). There is moderate  left ventricular hypertrophy. Left ventricular diastolic parameters are  consistent with Grade I diastolic dysfunction (impaired relaxation).   2. Right ventricular systolic function is normal. The right ventricular  size is normal. Tricuspid regurgitation signal is inadequate for assessing  PA pressure.   3. The mitral valve is grossly normal. Mild mitral valve regurgitation.   4. The aortic valve is tricuspid. Aortic valve regurgitation is not  visualized. Mild aortic valve sclerosis is  present, with no evidence of  aortic valve stenosis.   5. Aortic dilatation noted. There is mild dilatation of the aortic root,  measuring 40 mm.   6. The inferior vena cava is normal in size with greater than 50%  respiratory variability, suggesting right atrial pressure of 3 mmHg.   Assessment and Plan:  1.  HFrEF with ischemic cardiomyopathy.  LVEF 40 to 45%.  Continuing medical therapy as tolerated, he has declined ICD.  No sudden palpitations or syncope.  GDMT limited to some degree as he did not tolerate Entresto previously and has had recurrent hyperkalemia which limits use of MRA.  I reviewed his lab work from July.  Continue aspirin, Toprol-XL, Benicar, Norvasc, and as needed nitroglycerin.  Switching statin from Zocor to Crestor.  2.  Mixed hyperlipidemia, on high-dose Zocor with plan to switch to Crestor 20 mg daily to reduce potential interaction with Norvasc (although he has tolerated the combination well over time).  Recheck FLP for next visit.  3.  Multivessel CAD status post prior PCI as discussed above, symptomatically stable without active angina or  nitroglycerin use.  Continue aspirin and Plavix along with statin therapy.  Medication Adjustments/Labs and Tests Ordered: Current medicines are reviewed at length with the patient today.  Concerns regarding medicines are outlined above.   Tests Ordered: Orders Placed This Encounter  Procedures   Lipid Profile    Medication Changes: Meds ordered this encounter  Medications   rosuvastatin (CRESTOR) 20 MG tablet    Sig: Take 1 tablet (20 mg total) by mouth daily.    Dispense:  90 tablet    Refill:  3    04/20/22 stop zocor    Disposition:  Follow up  6 months.  Signed, Satira Sark, MD, [facility] 04/20/2022 9:54 AM    [facility] [place]. [address] [place], [place] [address] Phone: [phone]; Fax: [phone]

## 2022-04-20 ENCOUNTER — Ambulatory Visit: Payer: Medicare HMO | Attending: Cardiology | Admitting: Cardiology

## 2022-04-20 ENCOUNTER — Encounter: Payer: Self-pay | Admitting: Cardiology

## 2022-04-20 VITALS — BP 132/82 | HR 74 | Ht 70.0 in | Wt 131.0 lb

## 2022-04-20 DIAGNOSIS — I5020 Unspecified systolic (congestive) heart failure: Secondary | ICD-10-CM | POA: Diagnosis not present

## 2022-04-20 DIAGNOSIS — I25119 Atherosclerotic heart disease of native coronary artery with unspecified angina pectoris: Secondary | ICD-10-CM | POA: Diagnosis not present

## 2022-04-20 DIAGNOSIS — E782 Mixed hyperlipidemia: Secondary | ICD-10-CM

## 2022-04-20 MED ORDER — ROSUVASTATIN CALCIUM 20 MG PO TABS: 20.0000 mg | ORAL_TABLET | Freq: Every day | ORAL | 3 refills | Status: AC

## 2022-04-20 NOTE — Assessment & Plan Note (Signed)
Active smoker/ MS phenotype with level 159 10/02/21 Spirometry 03/23/2017  FEV1 1.68 (58%)  Ratio 60 with mild curvature s rx prior   - PFT's  07/26/2017  FEV1 1.97 (73 % ) ratio 62  p 13 % improvement from saba p nothing prior to study with DLCO  50 % corrects to 59 % for alv volume. - try off zestoril 01/17/2020  - 08/26/2021   Walked on RA  x  2  lap(s) =  approx 500  ft  @ avg pace, stopped due to sob with lowest 02 sats 98% - 10/02/2021   Walked on RA  x  3   lap(s) =  approx 450  ft  @ mod fast pace, stopped due to end of study with lowest 02 sats 95%   - 10/02/2021 restart max rx for GERD/ pred x 6 d / doxy x 10  - 10/21/2021   try symbicort 80 x 2 on days with activity planned > did not do as of 04/19/2022 - 04/19/2022  After extensive coaching inhaler device,  effectiveness =    80% try symb 80  prn    Difficult to convince him to use symbicort as maint so encouraged use prn symbicort Based on two studies from NEJM  378; 20 p 1865 (2018) and 380 : p2020-30 (2019) in pts with mild asthma it is reasonable to use low dose symbicort eg 80 2bid "prn" flare in this setting but I emphasized this was only shown with symbicort and takes advantage of the rapid onset of action but is not the same as "rescue therapy" but can be stopped once the acute symptoms have resolved and the need for rescue has been minimized (< 2 x weekly)

## 2022-04-20 NOTE — Assessment & Plan Note (Signed)
Counseled re importance of smoking cessation but did not meet time criteria for separate billing           Each maintenance medication was reviewed in detail including emphasizing most importantly the difference between maintenance and prns and under what circumstances the prns are to be triggered using an action plan format where appropriate.  Total time for H and P, chart review, counseling, reviewing hfa  device(s) and generating customized AVS unique to this office visit / same day charting = 25 min

## 2022-04-20 NOTE — Patient Instructions (Signed)
Medication Instructions:  STOP Zocor  START Crestor (rosuvastatin) 20 mg daily  Labwork: Fasting Lipids in 6 months  Testing/Procedures: None today  Follow-Up: 6 months  Any Other Special Instructions Will Be Listed Below (If Applicable).  If you need a refill on your cardiac medications before your next appointment, please call your pharmacy.

## 2022-04-20 NOTE — Assessment & Plan Note (Signed)
TSH  16.2  10/02/2021   rec synthroid 25 mcg per day and f/u PCP   >>>  Now on 50 mcg per day/f/u PCP

## 2022-04-22 ENCOUNTER — Telehealth: Payer: Self-pay | Admitting: Cardiology

## 2022-04-22 NOTE — Telephone Encounter (Signed)
Pt c/o medication issue:  1. Name of Medication: Crestor 20 mg  2. How are you currently taking this medication (dosage and times per day)?   3. Are you having a reaction (difficulty breathing--STAT)?   4. What is your medication issue? Pt states that he needs a call back to go over the medication plan to change to crestor from zocor. Requesting call back to get clarification.

## 2022-04-22 NOTE — Telephone Encounter (Signed)
Went over instructions again to stop zocor and start crestor. Patient voiced understanding of plan.

## 2022-04-23 DIAGNOSIS — R9431 Abnormal electrocardiogram [ECG] [EKG]: Secondary | ICD-10-CM | POA: Diagnosis not present

## 2022-04-23 DIAGNOSIS — I10 Essential (primary) hypertension: Secondary | ICD-10-CM | POA: Diagnosis not present

## 2022-04-23 DIAGNOSIS — R079 Chest pain, unspecified: Secondary | ICD-10-CM | POA: Diagnosis not present

## 2022-04-23 DIAGNOSIS — R0789 Other chest pain: Secondary | ICD-10-CM | POA: Diagnosis not present

## 2022-04-23 DIAGNOSIS — F17210 Nicotine dependence, cigarettes, uncomplicated: Secondary | ICD-10-CM | POA: Diagnosis not present

## 2022-04-23 DIAGNOSIS — Z20822 Contact with and (suspected) exposure to covid-19: Secondary | ICD-10-CM | POA: Diagnosis not present

## 2022-04-23 DIAGNOSIS — E785 Hyperlipidemia, unspecified: Secondary | ICD-10-CM | POA: Diagnosis not present

## 2022-04-23 DIAGNOSIS — I452 Bifascicular block: Secondary | ICD-10-CM | POA: Diagnosis not present

## 2022-04-23 DIAGNOSIS — J189 Pneumonia, unspecified organism: Secondary | ICD-10-CM | POA: Diagnosis not present

## 2022-04-23 DIAGNOSIS — R059 Cough, unspecified: Secondary | ICD-10-CM | POA: Diagnosis not present

## 2022-04-23 DIAGNOSIS — Z743 Need for continuous supervision: Secondary | ICD-10-CM | POA: Diagnosis not present

## 2022-04-23 DIAGNOSIS — I4891 Unspecified atrial fibrillation: Secondary | ICD-10-CM | POA: Diagnosis not present

## 2022-04-23 DIAGNOSIS — R52 Pain, unspecified: Secondary | ICD-10-CM | POA: Diagnosis not present

## 2022-04-23 DIAGNOSIS — R918 Other nonspecific abnormal finding of lung field: Secondary | ICD-10-CM | POA: Diagnosis not present

## 2022-05-06 ENCOUNTER — Ambulatory Visit: Payer: Medicare HMO | Admitting: Hematology

## 2022-05-07 DIAGNOSIS — I2510 Atherosclerotic heart disease of native coronary artery without angina pectoris: Secondary | ICD-10-CM | POA: Diagnosis not present

## 2022-05-07 DIAGNOSIS — J449 Chronic obstructive pulmonary disease, unspecified: Secondary | ICD-10-CM | POA: Diagnosis not present

## 2022-05-14 ENCOUNTER — Ambulatory Visit (HOSPITAL_COMMUNITY)
Admission: RE | Admit: 2022-05-14 | Discharge: 2022-05-14 | Disposition: A | Discharge status: Home / Self Care | Payer: Medicare HMO | Source: Ambulatory Visit | Attending: Hematology | Admitting: Hematology

## 2022-05-14 ENCOUNTER — Inpatient Hospital Stay: Payer: Medicare HMO | Attending: Hematology

## 2022-05-14 DIAGNOSIS — I252 Old myocardial infarction: Secondary | ICD-10-CM | POA: Insufficient documentation

## 2022-05-14 DIAGNOSIS — Z923 Personal history of irradiation: Secondary | ICD-10-CM | POA: Diagnosis not present

## 2022-05-14 DIAGNOSIS — I2510 Atherosclerotic heart disease of native coronary artery without angina pectoris: Secondary | ICD-10-CM | POA: Diagnosis not present

## 2022-05-14 DIAGNOSIS — Z79899 Other long term (current) drug therapy: Secondary | ICD-10-CM | POA: Insufficient documentation

## 2022-05-14 DIAGNOSIS — Z7902 Long term (current) use of antithrombotics/antiplatelets: Secondary | ICD-10-CM | POA: Diagnosis not present

## 2022-05-14 DIAGNOSIS — C3492 Malignant neoplasm of unspecified part of left bronchus or lung: Secondary | ICD-10-CM | POA: Insufficient documentation

## 2022-05-14 DIAGNOSIS — Z7982 Long term (current) use of aspirin: Secondary | ICD-10-CM | POA: Insufficient documentation

## 2022-05-14 DIAGNOSIS — I10 Essential (primary) hypertension: Secondary | ICD-10-CM | POA: Diagnosis not present

## 2022-05-14 DIAGNOSIS — F17210 Nicotine dependence, cigarettes, uncomplicated: Secondary | ICD-10-CM | POA: Insufficient documentation

## 2022-05-14 DIAGNOSIS — J439 Emphysema, unspecified: Secondary | ICD-10-CM | POA: Diagnosis not present

## 2022-05-14 DIAGNOSIS — C3432 Malignant neoplasm of lower lobe, left bronchus or lung: Secondary | ICD-10-CM | POA: Diagnosis not present

## 2022-05-14 DIAGNOSIS — Z7951 Long term (current) use of inhaled steroids: Secondary | ICD-10-CM | POA: Diagnosis not present

## 2022-05-14 LAB — CBC WITH DIFFERENTIAL/PLATELET
Abs Immature Granulocytes: 0.02 10*3/uL (ref 0.00–0.07)
Basophils Absolute: 0.1 10*3/uL (ref 0.0–0.1)
Basophils Relative: 1 %
Eosinophils Absolute: 0.1 10*3/uL (ref 0.0–0.5)
Eosinophils Relative: 2 %
HCT: 45.2 % (ref 39.0–52.0)
Hemoglobin: 14.7 g/dL (ref 13.0–17.0)
Immature Granulocytes: 0 %
Lymphocytes Relative: 23 %
Lymphs Abs: 1.7 10*3/uL (ref 0.7–4.0)
MCH: 30.6 pg (ref 26.0–34.0)
MCHC: 32.5 g/dL (ref 30.0–36.0)
MCV: 94 fL (ref 80.0–100.0)
Monocytes Absolute: 0.7 10*3/uL (ref 0.1–1.0)
Monocytes Relative: 9 %
Neutro Abs: 4.9 10*3/uL (ref 1.7–7.7)
Neutrophils Relative %: 65 %
Platelets: 259 10*3/uL (ref 150–400)
RBC: 4.81 MIL/uL (ref 4.22–5.81)
RDW: 13.4 % (ref 11.5–15.5)
WBC: 7.5 10*3/uL (ref 4.0–10.5)
nRBC: 0 % (ref 0.0–0.2)

## 2022-05-14 LAB — COMPREHENSIVE METABOLIC PANEL
ALT: 13 U/L (ref 0–44)
AST: 22 U/L (ref 15–41)
Albumin: 4 g/dL (ref 3.5–5.0)
Alkaline Phosphatase: 74 U/L (ref 38–126)
Anion gap: 11 (ref 5–15)
BUN: 22 mg/dL (ref 8–23)
CO2: 23 mmol/L (ref 22–32)
Calcium: 9.3 mg/dL (ref 8.9–10.3)
Chloride: 100 mmol/L (ref 98–111)
Creatinine, Ser: 1.28 mg/dL — ABNORMAL HIGH (ref 0.61–1.24)
GFR, Estimated: 55 mL/min — ABNORMAL LOW (ref >60)
Glucose, Bld: 111 mg/dL — ABNORMAL HIGH (ref 70–99)
Potassium: 5.1 mmol/L (ref 3.5–5.1)
Sodium: 134 mmol/L — ABNORMAL LOW (ref 135–145)
Total Bilirubin: 0.7 mg/dL (ref 0.3–1.2)
Total Protein: 7.8 g/dL (ref 6.5–8.1)

## 2022-05-14 MED ORDER — IOHEXOL 300 MG/ML  SOLN
75.0000 mL | Freq: Once | INTRAMUSCULAR | Status: AC | PRN
  Administered 2022-05-14: 75 mL via INTRAVENOUS

## 2022-05-19 ENCOUNTER — Inpatient Hospital Stay (HOSPITAL_BASED_OUTPATIENT_CLINIC_OR_DEPARTMENT_OTHER): Payer: Medicare HMO | Admitting: Hematology

## 2022-05-19 ENCOUNTER — Encounter: Payer: Self-pay | Admitting: Hematology

## 2022-05-19 VITALS — BP 133/82 | HR 70 | Resp 20 | Wt 131.4 lb

## 2022-05-19 DIAGNOSIS — C3490 Malignant neoplasm of unspecified part of unspecified bronchus or lung: Secondary | ICD-10-CM

## 2022-05-19 DIAGNOSIS — I2510 Atherosclerotic heart disease of native coronary artery without angina pectoris: Secondary | ICD-10-CM | POA: Diagnosis not present

## 2022-05-19 DIAGNOSIS — F17210 Nicotine dependence, cigarettes, uncomplicated: Secondary | ICD-10-CM | POA: Diagnosis not present

## 2022-05-19 DIAGNOSIS — Z7902 Long term (current) use of antithrombotics/antiplatelets: Secondary | ICD-10-CM | POA: Diagnosis not present

## 2022-05-19 DIAGNOSIS — I10 Essential (primary) hypertension: Secondary | ICD-10-CM | POA: Diagnosis not present

## 2022-05-19 DIAGNOSIS — Z7982 Long term (current) use of aspirin: Secondary | ICD-10-CM | POA: Diagnosis not present

## 2022-05-19 DIAGNOSIS — Z7951 Long term (current) use of inhaled steroids: Secondary | ICD-10-CM | POA: Diagnosis not present

## 2022-05-19 DIAGNOSIS — Z79899 Other long term (current) drug therapy: Secondary | ICD-10-CM | POA: Diagnosis not present

## 2022-05-19 DIAGNOSIS — C3432 Malignant neoplasm of lower lobe, left bronchus or lung: Secondary | ICD-10-CM | POA: Diagnosis not present

## 2022-05-19 DIAGNOSIS — Z923 Personal history of irradiation: Secondary | ICD-10-CM | POA: Diagnosis not present

## 2022-05-19 DIAGNOSIS — I252 Old myocardial infarction: Secondary | ICD-10-CM | POA: Diagnosis not present

## 2022-05-19 NOTE — Patient Instructions (Addendum)
[facility]  Discharge Instructions  You were seen and examined today by Dr. Ellin Saba.  Dr. Ellin Saba has reviewed your most recent CT scan which revealed a mass in your lung that appears to have grown since July.  Dr. Ellin Saba has recommended a PET scan. Following the PET scan, we will need to discuss the next steps as far as treatment options.  Follow-up as scheduled.  Thank you for choosing [facility] to provide your oncology and hematology care.   To afford each patient quality time with our provider, please arrive at least 15 minutes before your scheduled appointment time. You may need to reschedule your appointment if you arrive late (10 or more minutes). Arriving late affects you and other patients whose appointments are after yours.  Also, if you miss three or more appointments without notifying the office, you may be dismissed from the clinic at the provider's discretion.    Again, thank you for choosing [facility].  Our hope is that these requests will decrease the amount of time that you wait before being seen by our physicians.   If you have a lab appointment with the Cancer Center please come in thru the Main Entrance and check in at the main information desk.           _____________________________________________________________  Should you have questions after your visit to [facility], please contact our office at [phone] and follow the prompts.  Our office hours are 8:00 a.m. to 4:30 p.m. Monday - Thursday and 8:00 a.m. to 2:30 p.m. Friday.  Please note that voicemails left after 4:00 p.m. may not be returned until the following business day.  We are closed weekends and all major holidays.  You do have access to a nurse 24-7, just call the main number to the clinic [phone] and do not press any options, hold on the line and a nurse will answer the phone.    For prescription  refill requests, have your pharmacy contact our office and allow 72 hours.    Masks are optional in the cancer centers. If you would like for your care team to wear a mask while they are taking care of you, please let them know. You may have one support person who is at least 85 years old accompany you for your appointments.

## 2022-05-19 NOTE — Progress Notes (Signed)
[facility] [address], [place] [address]   CLINIC:  Medical Oncology/Hematology  PCP:  Deatra Canter, FNP [id number] / [place] [address] [phone]   REASON FOR VISIT:  Follow-up for left squamous cell lung cancer  PRIOR THERAPY: Left lung SBRT 50 Gy in 5 fractions from 04/14/2020 to 04/24/2020  NGS Results: not done  CURRENT THERAPY: surveillance  BRIEF ONCOLOGIC HISTORY:  Oncology History  Squamous cell lung cancer, left (HCC)  12/08/2016 Initial Diagnosis   Squamous cell lung cancer, left (HCC)   11/12/2020 Imaging   1. LEFT infrahilar lesion of similar size compared to most recent study with evolving post treatment changes and diminished basilar nodularity that was seen previously. 2. Basilar nodularity discussed above likely post inflammatory or infectious, attention on follow-up. No new or progressive findings. 3. Diminishing size of mediastinal lymph nodes and particularly the dominant node anterior to the carina. 4. Three-vessel coronary artery disease. 5. Emphysema and aortic atherosclerosis.       CANCER STAGING:  Cancer Staging  No matching staging information was found for the patient.  INTERVAL HISTORY:  [name], a 87 y.o. male, seen for follow-up of left sided squamous cell lung cancer.  He was reportedly evaluated in December at [facility] for pneumonia.  He is back to his baseline although reports decreased energy levels.  Baseline cough and shortness of breath have been stable.  REVIEW OF SYSTEMS:  Review of Systems  Constitutional:  Negative for appetite change and fatigue.  Respiratory:  Positive for cough and shortness of breath.   Genitourinary:  Positive for frequency.   Neurological:  Positive for numbness.  All other systems reviewed and are negative.   PAST MEDICAL/SURGICAL HISTORY:  Past Medical History:  Diagnosis Date   Arthritis    COPD (chronic obstructive  pulmonary disease) (HCC)    Coronary atherosclerosis of native coronary artery    BMS circ and PTCA PDA 2007, DES RCA 10/09, DES LAD 2/12   Essential hypertension    GERD (gastroesophageal reflux disease)    Hearing loss    Heart attack (HCC)    x 3 (latest 2009)   History of kidney stones    Hyperlipidemia    Ischemic cardiomyopathy    NSTEMI (non-ST elevated myocardial infarction) (HCC)    2007   Squamous cell lung cancer [facility])    Past Surgical History:  Procedure Laterality Date   ANGIOPLASTY     stent   BRONCHIAL BIOPSY  02/19/2020   Procedure: BRONCHIAL BIOPSIES;  Surgeon: Leslye Peer, MD;  Location: [facility] ENDOSCOPY;  Service: Pulmonary;;   BRONCHIAL BRUSHINGS  02/19/2020   Procedure: BRONCHIAL BRUSHINGS;  Surgeon: Leslye Peer, MD;  Location: [facility] ENDOSCOPY;  Service: Pulmonary;;   BRONCHIAL NEEDLE ASPIRATION BIOPSY  02/19/2020   Procedure: BRONCHIAL NEEDLE ASPIRATION BIOPSIES;  Surgeon: Leslye Peer, MD;  Location: [facility] ENDOSCOPY;  Service: Pulmonary;;   COLONOSCOPY     FIDUCIAL MARKER PLACEMENT  02/19/2020   Procedure: FIDUCIAL MARKER PLACEMENT;  Surgeon: Leslye Peer, MD;  Location: [facility] ENDOSCOPY;  Service: Pulmonary;;   HERNIA REPAIR     LIPOMA EXCISION Right    hip/buttox   TONSILLECTOMY     as a child   VIDEO BRONCHOSCOPY WITH ENDOBRONCHIAL NAVIGATION N/A 02/19/2020   Procedure: VIDEO BRONCHOSCOPY WITH ENDOBRONCHIAL NAVIGATION;  Surgeon: Leslye Peer, MD;  Location: [facility] ENDOSCOPY;  Service: Pulmonary;  Laterality: N/A;    SOCIAL HISTORY:  Social History   Socioeconomic History   Marital status: Married    Spouse name: Not on file   Number of children: Not on file   Years of education: Not on file   Highest education level: Not on file  Occupational History   Occupation: Retired  Tobacco Use   Smoking status: Every Day    Packs/day: 0.50    Years: 63.00    Total pack years: 31.50    Types: Cigarettes    Start date: 06/15/1951   Smokeless  tobacco: Never   Tobacco comments:    10 cigarettes a day [facility] 08/26/2021  Vaping Use   Vaping Use: Never used  Substance and Sexual Activity   Alcohol use: No    Alcohol/week: 0.0 standard drinks of alcohol   Drug use: No   Sexual activity: Not Currently  Other Topics Concern   Not on file  Social History Narrative   Not on file   Social Determinants of Health   Financial Resource Strain: Low Risk  (02/28/2020)   Overall Financial Resource Strain (CARDIA)    Difficulty of Paying Living Expenses: Not hard at all  Food Insecurity: No Food Insecurity (02/28/2020)   Hunger Vital Sign    Worried About Running Out of Food in the Last Year: Never true    Ran Out of Food in the Last Year: Never true  Transportation Needs: No Transportation Needs (02/28/2020)   PRAPARE - Administrator, Civil Service (Medical): No    Lack of Transportation (Non-Medical): No  Physical Activity: Insufficiently Active (02/28/2020)   Exercise Vital Sign    Days of Exercise per Week: 7 days    Minutes of Exercise per Session: 10 min  Stress: Stress Concern Present (02/28/2020)   [employer] of Occupational Health - Occupational Stress Questionnaire    Feeling of Stress : Very much  Social Connections: Socially Isolated (02/28/2020)   Social Connection and Isolation Panel [NHANES]    Frequency of Communication with Friends and Family: More than three times a week    Frequency of Social Gatherings with Friends and Family: More than three times a week    Attends Religious Services: Never    Database administrator or Organizations: No    Attends Banker Meetings: Never    Marital Status: Divorced  Catering manager Violence: Not At Risk (02/28/2020)   Humiliation, Afraid, Rape, and Kick questionnaire    Fear of Current or Ex-Partner: No    Emotionally Abused: No    Physically Abused: No    Sexually Abused: No    FAMILY HISTORY:  Family History  Problem Relation Age of  Onset   Throat cancer Father    Heart attack Mother    Alzheimer's disease Mother     CURRENT MEDICATIONS:  Current Outpatient Medications  Medication Sig Dispense Refill   albuterol (VENTOLIN HFA) 108 (90 Base) MCG/ACT inhaler Inhale into the lungs every 6 (six) hours as needed for wheezing or shortness of breath.     amLODipine (NORVASC) 5 MG tablet Take 1 tablet (5 mg total) by mouth daily. 90 tablet 1   aspirin EC 81 MG tablet Take 1 tablet by mouth daily.     budesonide-formoterol (SYMBICORT) 80-4.5 MCG/ACT inhaler 2 puffs on days prior to heavy activity 1 each 11   clopidogrel (PLAVIX) 75 MG tablet TAKE 1 TABLET BY MOUTH ONCE DAILY . APPOINTMENT REQUIRED FOR FUTURE REFILLS 90 tablet 3   cyclobenzaprine (FLEXERIL) 5 MG  tablet Take 1 tablet by mouth three times daily as needed for muscle spasm 90 tablet 0   famotidine (PEPCID) 20 MG tablet One after supper 30 tablet 11   HYDROcodone-acetaminophen (NORCO) 7.5-325 MG tablet Take 1 tablet by mouth 2 (two) times daily as needed.     levofloxacin (LEVAQUIN) 750 MG tablet Take 750 mg by mouth daily.     levothyroxine (SYNTHROID) 50 MCG tablet TAKE 1 TABLET BY MOUTH ONCE DAILY BEFORE BREAKFAST 30 tablet 3   metoprolol succinate (TOPROL XL) 25 MG 24 hr tablet Take 1 tablet (25 mg total) by mouth daily. 90 tablet 3   Naphazoline HCl (CLEAR EYES OP) Place 1 drop into both eyes daily as needed (irritation / redness).     nitroGLYCERIN (NITROSTAT) 0.4 MG SL tablet DISSOLVE ONE TABLET UNDER THE TONGUE EVERY 5 MINUTES AS NEEDED FOR CHEST PAIN.  DO NOT EXCEED A TOTAL OF 3 DOSES IN 15 MINUTES 25 tablet 3   olmesartan (BENICAR) 40 MG tablet Take 40 mg by mouth daily.     OXYGEN Inhale into the lungs.     pantoprazole (PROTONIX) 40 MG tablet TAKE 1 TABLET BY MOUTH ONCE DAILY 30 TO 60 MINUTES BEFORE FIRST MEAL OF THE DAY 90 tablet 0   rosuvastatin (CRESTOR) 20 MG tablet Take 1 tablet (20 mg total) by mouth daily. 90 tablet 3   simvastatin (ZOCOR) 80 MG  tablet Take by mouth.     No current facility-administered medications for this visit.    ALLERGIES:  Allergies  Allergen Reactions   Entresto [Sacubitril-Valsartan] Itching    PHYSICAL EXAM:  Performance status (ECOG): 2 - Symptomatic, <50% confined to bed  Vitals:   05/19/22 1320  BP: 133/82  Pulse: 70  Resp: 20   Wt Readings from Last 3 Encounters:  05/19/22 131 lb 6.4 oz (59.6 kg)  04/20/22 131 lb (59.4 kg)  04/19/22 131 lb 12.8 oz (59.8 kg)   Physical Exam Vitals reviewed.  Constitutional:      Appearance: Normal appearance.  Cardiovascular:     Rate and Rhythm: Normal rate and regular rhythm.     Pulses: Normal pulses.     Heart sounds: Normal heart sounds.  Pulmonary:     Effort: Pulmonary effort is normal.     Breath sounds: Normal breath sounds.  Neurological:     General: No focal deficit present.     Mental Status: He is alert and oriented to person, place, and time.  Psychiatric:        Mood and Affect: Mood normal.        Behavior: Behavior normal.      LABORATORY DATA:  I have reviewed the labs as listed.     Latest Ref Rng & Units 05/14/2022    1:00 PM 11/23/2021    9:48 AM 10/02/2021    9:55 AM  CBC  WBC 4.0 - 10.5 K/uL 7.5  6.8  6.8   Hemoglobin 13.0 - 17.0 g/dL 46.9  58.4  87.4   Hematocrit 39.0 - 52.0 % 45.2  42.1  47.6   Platelets 150 - 400 K/uL 259  241  235       Latest Ref Rng & Units 05/14/2022    1:00 PM 11/23/2021    9:48 AM 10/21/2021    1:57 PM  CMP  Glucose 70 - 99 mg/dL 254  938  93   BUN 8 - 23 mg/dL 22  16  13    Creatinine 0.61 - 1.24  mg/dL 8.20  9.95  0.59   Sodium 135 - 145 mmol/L 134  136  136   Potassium 3.5 - 5.1 mmol/L 5.1  4.3  5.0   Chloride 98 - 111 mmol/L 100  106  99   CO2 22 - 32 mmol/L 23  25  21    Calcium 8.9 - 10.3 mg/dL 9.3  8.8  9.1   Total Protein 6.5 - 8.1 g/dL 7.8  7.0    Total Bilirubin 0.3 - 1.2 mg/dL 0.7  0.6    Alkaline Phos 38 - 126 U/L 74  55    AST 15 - 41 U/L 22  15    ALT 0 - 44 U/L 13  9       DIAGNOSTIC IMAGING:  I have independently reviewed the scans and discussed with the patient. CT Chest W Contrast  Result Date: 05/15/2022 CLINICAL DATA:  Left lower lobe non-small cell lung cancer status post radiation therapy. Restaging. * Tracking Code: BO * EXAM: CT CHEST WITH CONTRAST TECHNIQUE: Multidetector CT imaging of the chest was performed during intravenous contrast administration. RADIATION DOSE REDUCTION: This exam was performed according to the departmental dose-optimization program which includes automated exposure control, adjustment of the mA and/or kV according to patient size and/or use of iterative reconstruction technique. CONTRAST:  70mL OMNIPAQUE IOHEXOL 300 MG/ML  SOLN COMPARISON:  11/23/2021 chest CT.  07/23/2021 PET-CT. FINDINGS: Cardiovascular: Normal heart size. No significant pericardial effusion/thickening. Three-vessel coronary atherosclerosis. Atherosclerotic nonaneurysmal thoracic aorta. Normal caliber pulmonary arteries. No central pulmonary emboli. Mediastinum/Nodes: No significant thyroid nodules. Unremarkable esophagus. No pathologically enlarged axillary, mediastinal or hilar lymph nodes. Lungs/Pleura: No pneumothorax. No pleural effusion. Severe centrilobular emphysema with diffuse bronchial wall thickening. Irregular solid masslike consolidation in anterior left lower lobe surrounding the fiducial marker measures 4.8 x 2.6 cm (series 4/image 123), previously 4.9 x 3.0 cm on 04/23/2022 chest CT angiogram study using similar measurement technique, not substantially changed. Tiny solid posterior right upper lobe 0.3 cm pulmonary nodule is stable (series 4/image 88). No acute consolidative airspace disease or new significant pulmonary nodules. Irregular mildly thickened subpleural bandlike postinfectious scarring in the peripheral anterior basilar left lower lobe. Upper abdomen: No acute abnormality. Musculoskeletal:  No aggressive appearing focal osseous lesions.  IMPRESSION: 1. Irregular solid masslike consolidation in the anterior left lower lobe surrounding the fiducial marker is not substantially changed since 04/23/2022 chest CT angiogram study, most compatible with treated tumor. No findings to suggest local tumor recurrence. Continued chest CT surveillance warranted. 2. No evidence of metastatic disease in the chest. 3. Three-vessel coronary atherosclerosis. 4. Aortic Atherosclerosis (ICD10-I70.0) and Emphysema (ICD10-J43.9). Electronically Signed   By: 04/25/2022 M.D.   On: 05/15/2022 18:24     ASSESSMENT:  1.  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 -Patient placed on O2 nasal cannula in the last 3 weeks for drop in sats on exertion. -PET scan on 01/28/2020 shows 2.6 cm left lower lobe nodule SUV 10.1.  Focal hypermetabolic them in the left hilum identified without discrete lymph node visible on CT.  10 mm short axis precarinal lymph node with low-level SUV 2.9.  7 mm short axis right hilar node with SUV 3.8. -Navigational bronchoscopy and biopsy of the left lower lobe lung lesion consistent with squamous cell carcinoma. -IMRT to the left lower lung mass from 04/14/2020 through 04/24/2020, 50 Gy/ 5 fractions. - NGS  testing on sample from 02/19/2020: Negative for BRAF V600 E.  MMR proficient.  PD-L1 22 C3, 28-8 and SP142 0%.  TRK A/B/C-. - Guardant360: Unconventional EGFR mutation -PET scan on 04/09/2021 showed left lower lobe lung mass measuring 2.1 x 2.2 cm, SUV 5.7.  Hilar hypermetabolic without well-defined adenopathy. - He was evaluated by Dr. Roselind Messier and was felt to be not a candidate for radiation or SBRT.    2.  Social/family history: -2 pack/day smoker for 68 years.  Currently smokes 4 cigarettes/day. 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 -Father had throat cancer.   PLAN:  1.  Clinical  T1c N0 M0 squamous cell carcinoma of left lung lower lobe: - He continues to smoke 1 pack of cigarettes per day. - CT angiogram (04/23/2022): Increasing masslike left lower lobe lesion adjacent to the fiducial markers compared to scan from July.  I reviewed records from [facility]. - Reviewed labs from 05/14/2022 which showed normal LFTs.  Creatinine is elevated at 1.28.  CBC was normal. - CT chest open.  05/14/2022): Irregular solid masslike consolidation in the anterior left lower lobe surrounding the fiducial markers measures 4.8 x 2.6 cm. - Lung mass has clearly increased in size from July.  I have recommended PET CT scan to evaluate for metastatic disease. - Palliative chemoimmunotherapy options include 9 LA regimen.       Orders placed this encounter:  Orders Placed This Encounter  Procedures   NM PET Image Restag (PS) Skull Base To Thigh      Doreatha Massed, MD [facility] [phone]

## 2022-05-27 ENCOUNTER — Ambulatory Visit (HOSPITAL_COMMUNITY)
Admission: RE | Admit: 2022-05-27 | Discharge: 2022-05-27 | Disposition: A | Discharge status: Home / Self Care | Payer: Medicare HMO | Source: Ambulatory Visit | Attending: Hematology | Admitting: Hematology

## 2022-05-27 DIAGNOSIS — C3490 Malignant neoplasm of unspecified part of unspecified bronchus or lung: Secondary | ICD-10-CM

## 2022-05-27 DIAGNOSIS — J439 Emphysema, unspecified: Secondary | ICD-10-CM | POA: Diagnosis not present

## 2022-05-27 DIAGNOSIS — I714 Abdominal aortic aneurysm, without rupture, unspecified: Secondary | ICD-10-CM | POA: Diagnosis not present

## 2022-05-27 DIAGNOSIS — I6523 Occlusion and stenosis of bilateral carotid arteries: Secondary | ICD-10-CM | POA: Diagnosis not present

## 2022-05-27 MED ORDER — FLUDEOXYGLUCOSE F - 18 (FDG) INJECTION
6.7500 | Freq: Once | INTRAVENOUS | Status: AC | PRN
  Administered 2022-05-27: 6.75 via INTRAVENOUS

## 2022-06-01 ENCOUNTER — Encounter: Payer: Self-pay | Admitting: Hematology

## 2022-06-01 ENCOUNTER — Inpatient Hospital Stay (HOSPITAL_BASED_OUTPATIENT_CLINIC_OR_DEPARTMENT_OTHER): Payer: Medicare HMO | Admitting: Hematology

## 2022-06-01 VITALS — BP 147/87 | HR 61 | Temp 97.2°F | Resp 18 | Ht 70.0 in | Wt 130.5 lb

## 2022-06-01 DIAGNOSIS — C3432 Malignant neoplasm of lower lobe, left bronchus or lung: Secondary | ICD-10-CM | POA: Diagnosis not present

## 2022-06-01 DIAGNOSIS — Z79899 Other long term (current) drug therapy: Secondary | ICD-10-CM | POA: Diagnosis not present

## 2022-06-01 DIAGNOSIS — C3490 Malignant neoplasm of unspecified part of unspecified bronchus or lung: Secondary | ICD-10-CM

## 2022-06-01 DIAGNOSIS — Z7902 Long term (current) use of antithrombotics/antiplatelets: Secondary | ICD-10-CM | POA: Diagnosis not present

## 2022-06-01 DIAGNOSIS — I252 Old myocardial infarction: Secondary | ICD-10-CM | POA: Diagnosis not present

## 2022-06-01 DIAGNOSIS — Z923 Personal history of irradiation: Secondary | ICD-10-CM | POA: Diagnosis not present

## 2022-06-01 DIAGNOSIS — I2510 Atherosclerotic heart disease of native coronary artery without angina pectoris: Secondary | ICD-10-CM | POA: Diagnosis not present

## 2022-06-01 DIAGNOSIS — Z7982 Long term (current) use of aspirin: Secondary | ICD-10-CM | POA: Diagnosis not present

## 2022-06-01 DIAGNOSIS — Z7951 Long term (current) use of inhaled steroids: Secondary | ICD-10-CM | POA: Diagnosis not present

## 2022-06-01 DIAGNOSIS — I10 Essential (primary) hypertension: Secondary | ICD-10-CM | POA: Diagnosis not present

## 2022-06-01 DIAGNOSIS — F17210 Nicotine dependence, cigarettes, uncomplicated: Secondary | ICD-10-CM | POA: Diagnosis not present

## 2022-06-01 NOTE — Patient Instructions (Addendum)
[place] at [facility] Discharge Instructions   You were seen and examined today by Dr. Delton Coombes.  He reviewed the results of your PET scan. It shows a couple of spots on your lung that are lighting up, indicating that the cancer is back.   You are not a candidate for anymore radiation and you are not a surgical candidate. The next option for treatment is chemotherapy. Pills are not an option at this point for treatment since the tumor does not show any targetable mutations.   Dr. Raliegh Ip thinks it is okay to just watch for now because the spots are so small. But if you'd like to initiate treatment with chemotherapy and immunotherapy.    Thank you for choosing [place] at [facility] to provide your oncology and hematology care.  To afford each patient quality time with our provider, please arrive at least 15 minutes before your scheduled appointment time.   If you have a lab appointment with the [place] please come in thru the Main Entrance and check in at the main information desk.  You need to re-schedule your appointment should you arrive 10 or more minutes late.  We strive to give you quality time with our providers, and arriving late affects you and other patients whose appointments are after yours.  Also, if you no show three or more times for appointments you may be dismissed from the clinic at the providers discretion.     Again, thank you for choosing [facility].  Our hope is that these requests will decrease the amount of time that you wait before being seen by our physicians.       _____________________________________________________________  Should you have questions after your visit to [facility], please contact our office at [phone] and follow the prompts.  Our office hours are 8:00 a.m. and 4:30 p.m. Monday - Friday.  Please note that voicemails left after 4:00 p.m. may not be returned until  the following business day.  We are closed weekends and major holidays.  You do have access to a nurse 24-7, just call the main number to the clinic [phone] and do not press any options, hold on the line and a nurse will answer the phone.    For prescription refill requests, have your pharmacy contact our office and allow 72 hours.    Due to Covid, you will need to wear a mask upon entering the hospital. If you do not have a mask, a mask will be given to you at the Main Entrance upon arrival. For doctor visits, patients may have 1 support person age 62 or older with them. For treatment visits, patients can not have anyone with them due to social distancing guidelines and our immunocompromised population.

## 2022-06-01 NOTE — Progress Notes (Signed)
[name], [name] [id number]   CLINIC:  Medical Oncology/Hematology  PCP:  Lysbeth Penner, [place] [address] / [place] [address] [phone]   REASON FOR VISIT:  Follow-up for left squamous cell lung cancer  PRIOR THERAPY: Left lung SBRT 50 Gy in 5 fractions from 04/14/2020 to 04/24/2020  NGS Results: not done  CURRENT THERAPY: surveillance  BRIEF ONCOLOGIC HISTORY:  Oncology History  Squamous cell lung cancer, left ([place])  12/08/2016 Initial Diagnosis   Squamous cell lung cancer, left ([place])   11/12/2020 Imaging   1. LEFT infrahilar lesion of similar size compared to most recent study with evolving post treatment changes and diminished basilar nodularity that was seen previously. 2. Basilar nodularity discussed above likely post inflammatory or infectious, attention on follow-up. No new or progressive findings. 3. Diminishing size of mediastinal lymph nodes and particularly the dominant node anterior to the carina. 4. Three-vessel coronary artery disease. 5. Emphysema and aortic atherosclerosis.       CANCER STAGING:  Cancer Staging  No matching staging information was found for the patient.  INTERVAL HISTORY:  [name] Graham, a 87 y.o. male, seen for follow-up of left sided squamous cell lung cancer.  [name] Graham was last seen by me on 05/19/22. We reviewed his CT chest (05/14/22) and NM PET scan (05/27/22) results today.  Today, [name] Graham states that [name] Graham is doing okay. His appetite is at 100% and his energy levels are at 75%. [name] Graham continues to have some fatigue and shortness of breath which [name] Graham attributes to his COPD. [name] Graham reports significant shortness of breath with walking short distances such as from his bed to the bathroom at night. [name] Graham uses an inhaler before bed which [name] Graham does not like due to the powder causing him to cough. [name] Graham is smoking 1ppd.  [name] Graham reports accompanying cramping in his feet occasionally. [name] Graham has occasional minimal amounts  of blood with blowing his nose but attributes this to his allergies. [name] Graham denies any chest pain, cough, or hemoptysis. [name] Graham denies any weight loss.  REVIEW OF SYSTEMS:  Review of Systems  Constitutional:  Positive for fatigue. Negative for chills, fever and unexpected weight change.  HENT:   Negative for lump/mass, mouth sores, sore throat and trouble swallowing.   Respiratory:  Positive for shortness of breath and wheezing. Negative for cough and hemoptysis.   Cardiovascular:  Negative for chest pain, leg swelling and palpitations.  Gastrointestinal:  Negative for abdominal pain, constipation, diarrhea, nausea and vomiting.  Genitourinary:  Negative for bladder incontinence, difficulty urinating, dysuria, frequency, hematuria and nocturia.   Musculoskeletal:  Negative for arthralgias, back pain, flank pain and neck pain.       + Cramping in feet  Skin:  Negative for itching and rash.  Neurological:  Negative for dizziness, headaches and numbness.  Hematological:  Does not bruise/bleed easily.  Psychiatric/Behavioral:  Negative for depression, sleep disturbance and suicidal ideas. The patient is not nervous/anxious.   All other systems reviewed and are negative.   PAST MEDICAL/SURGICAL HISTORY:  Past Medical History:  Diagnosis Date   Arthritis    COPD (chronic obstructive pulmonary disease) ([place])    Coronary atherosclerosis of native coronary artery    BMS circ and PTCA PDA 2007, DES RCA 10/09, DES LAD 2/12   Essential hypertension    GERD (gastroesophageal reflux disease)    Hearing loss    Heart attack ([place])    x 3 (latest 2009)   History  of kidney stones    Hyperlipidemia    Ischemic cardiomyopathy    NSTEMI (non-ST elevated myocardial infarction) ([place])    2007   Squamous cell lung cancer [facility])    Past Surgical History:  Procedure Laterality Date   ANGIOPLASTY     stent   BRONCHIAL BIOPSY  02/19/2020   Procedure: BRONCHIAL BIOPSIES;  Surgeon: Collene Gobble, MD;  Location:  [facility] ENDOSCOPY;  Service: Pulmonary;;   BRONCHIAL BRUSHINGS  02/19/2020   Procedure: BRONCHIAL BRUSHINGS;  Surgeon: Collene Gobble, MD;  Location: [facility] ENDOSCOPY;  Service: Pulmonary;;   BRONCHIAL NEEDLE ASPIRATION BIOPSY  02/19/2020   Procedure: BRONCHIAL NEEDLE ASPIRATION BIOPSIES;  Surgeon: Collene Gobble, MD;  Location: [facility];  Service: Pulmonary;;   COLONOSCOPY     FIDUCIAL MARKER PLACEMENT  02/19/2020   Procedure: FIDUCIAL MARKER PLACEMENT;  Surgeon: Collene Gobble, MD;  Location: [facility] ENDOSCOPY;  Service: Pulmonary;;   HERNIA REPAIR     LIPOMA EXCISION Right    hip/buttox   TONSILLECTOMY     as a child   VIDEO BRONCHOSCOPY WITH ENDOBRONCHIAL NAVIGATION N/A 02/19/2020   Procedure: VIDEO BRONCHOSCOPY WITH ENDOBRONCHIAL NAVIGATION;  Surgeon: Collene Gobble, MD;  Location: [facility] ENDOSCOPY;  Service: Pulmonary;  Laterality: N/A;    SOCIAL HISTORY:  Social History   Socioeconomic History   Marital status: Married    Spouse name: Not on file   Number of children: Not on file   Years of education: Not on file   Highest education level: Not on file  Occupational History   Occupation: Retired  Tobacco Use   Smoking status: Every Day    Packs/day: 0.50    Years: 63.00    Total pack years: 31.50    Types: Cigarettes    Start date: 06/15/1951   Smokeless tobacco: Never   Tobacco comments:    10 cigarettes a day [facility] 08/26/2021  Vaping Use   Vaping Use: Never used  Substance and Sexual Activity   Alcohol use: No    Alcohol/week: 0.0 standard drinks of alcohol   Drug use: No   Sexual activity: Not Currently  Other Topics Concern   Not on file  Social History Narrative   Not on file   Social Determinants of Health   Financial Resource Strain: Low Risk  (02/28/2020)   Overall Financial Resource Strain (CARDIA)    Difficulty of Paying Living Expenses: Not hard at all  Food Insecurity: No Food Insecurity (02/28/2020)   Hunger Vital Sign    Worried About Running Out of Food  in the Last Year: Never true    [place] in the Last Year: Never true  Transportation Needs: No Transportation Needs (02/28/2020)   PRAPARE - Hydrologist (Medical): No    Lack of Transportation (Non-Medical): No  Physical Activity: Insufficiently Active (02/28/2020)   Exercise Vital Sign    Days of Exercise per Week: 7 days    Minutes of Exercise per Session: 10 min  Stress: Stress Concern Present (02/28/2020)   [place]    Feeling of Stress : Very much  Social Connections: Socially Isolated (02/28/2020)   Social Connection and Isolation Panel [NHANES]    Frequency of Communication with Friends and Family: More than three times a week    Frequency of Social Gatherings with Friends and Family: More than three times a week    Attends Religious Services: Never  Active Member of Clubs or Organizations: No    Attends Archivist Meetings: Never    Marital Status: Divorced  Human resources officer Violence: Not At Risk (02/28/2020)   Humiliation, Afraid, Rape, and Kick questionnaire    Fear of Current or Ex-Partner: No    Emotionally Abused: No    Physically Abused: No    Sexually Abused: No    FAMILY HISTORY:  Family History  Problem Relation Age of Onset   Throat cancer Father    Heart attack Mother    Alzheimer's disease Mother     CURRENT MEDICATIONS:  Current Outpatient Medications  Medication Sig Dispense Refill   albuterol (VENTOLIN HFA) 108 (90 Base) MCG/ACT inhaler Inhale into the lungs every 6 (six) hours as needed for wheezing or shortness of breath.     amLODipine (NORVASC) 5 MG tablet Take 1 tablet (5 mg total) by mouth daily. 90 tablet 1   aspirin EC 81 MG tablet Take 1 tablet by mouth daily.     clopidogrel (PLAVIX) 75 MG tablet TAKE 1 TABLET BY MOUTH ONCE DAILY . APPOINTMENT REQUIRED FOR FUTURE REFILLS 90 tablet 3   cyclobenzaprine (FLEXERIL) 5 MG tablet  Take 1 tablet by mouth three times daily as needed for muscle spasm 90 tablet 0   famotidine (PEPCID) 20 MG tablet One after supper 30 tablet 11   HYDROcodone-acetaminophen (NORCO) 7.5-325 MG tablet Take 1 tablet by mouth 2 (two) times daily as needed.     levofloxacin (LEVAQUIN) 750 MG tablet Take 750 mg by mouth daily.     levothyroxine (SYNTHROID) 50 MCG tablet TAKE 1 TABLET BY MOUTH ONCE DAILY BEFORE BREAKFAST 30 tablet 3   metoprolol succinate (TOPROL XL) 25 MG 24 hr tablet Take 1 tablet (25 mg total) by mouth daily. 90 tablet 3   Naphazoline HCl (CLEAR EYES OP) Place 1 drop into both eyes daily as needed (irritation / redness).     nitroGLYCERIN (NITROSTAT) 0.4 MG SL tablet DISSOLVE ONE TABLET UNDER THE TONGUE EVERY 5 MINUTES AS NEEDED FOR CHEST PAIN.  DO NOT EXCEED A TOTAL OF 3 DOSES IN 15 MINUTES 25 tablet 3   olmesartan (BENICAR) 40 MG tablet Take 40 mg by mouth daily.     OXYGEN Inhale into the lungs.     pantoprazole (PROTONIX) 40 MG tablet TAKE 1 TABLET BY MOUTH ONCE DAILY 30 TO 60 MINUTES BEFORE FIRST MEAL OF THE DAY 90 tablet 0   rosuvastatin (CRESTOR) 20 MG tablet Take 1 tablet (20 mg total) by mouth daily. 90 tablet 3   simvastatin (ZOCOR) 80 MG tablet Take by mouth.     budesonide-formoterol (SYMBICORT) 80-4.5 MCG/ACT inhaler 2 puffs on days prior to heavy activity (Patient not taking: Reported on 06/01/2022) 1 each 11   No current facility-administered medications for this visit.    ALLERGIES:  Allergies  Allergen Reactions   Entresto [Sacubitril-Valsartan] Itching    PHYSICAL EXAM:  Performance status (ECOG): 2 - Symptomatic, <50% confined to bed  Vitals:   06/01/22 0850  BP: (!) 147/87  Pulse: 61  Resp: 18  Temp: (!) 97.2 F (36.2 C)  SpO2: 98%   Wt Readings from Last 3 Encounters:  06/01/22 59.2 kg (130 lb 8 oz)  05/19/22 59.6 kg (131 lb 6.4 oz)  04/20/22 59.4 kg (131 lb)   Physical Exam Vitals reviewed.  Constitutional:      Appearance: Normal  appearance.  Cardiovascular:     Rate and Rhythm: Normal rate and regular rhythm.  Pulses: Normal pulses.     Heart sounds: Normal heart sounds.  Pulmonary:     Effort: Pulmonary effort is normal.     Breath sounds: Normal breath sounds.  Neurological:     General: No focal deficit present.     Mental Status: [name] Graham is alert and oriented to person, place, and time.  Psychiatric:        Mood and Affect: Mood normal.        Behavior: Behavior normal.      LABORATORY DATA:  I have reviewed the labs as listed.     Latest Ref Rng & Units 05/14/2022    1:00 PM 11/23/2021    9:48 AM 10/02/2021    9:55 AM  CBC  WBC 4.0 - 10.5 K/uL 7.5  6.8  6.8   Hemoglobin 13.0 - 17.0 g/dL 14.7  14.2  16.0   Hematocrit 39.0 - 52.0 % 45.2  42.1  47.6   Platelets 150 - 400 K/uL 259  241  235       Latest Ref Rng & Units 05/14/2022    1:00 PM 11/23/2021    9:48 AM 10/21/2021    1:57 PM  CMP  Glucose 70 - 99 mg/dL 111  104  93   BUN 8 - 23 mg/dL '22  16  13   '$ Creatinine 0.61 - 1.24 mg/dL 1.28  1.09  1.05   Sodium 135 - 145 mmol/L 134  136  136   Potassium 3.5 - 5.1 mmol/L 5.1  4.3  5.0   Chloride 98 - 111 mmol/L 100  106  99   CO2 22 - 32 mmol/L '23  25  21   '$ Calcium 8.9 - 10.3 mg/dL 9.3  8.8  9.1   Total Protein 6.5 - 8.1 g/dL 7.8  7.0    Total Bilirubin 0.3 - 1.2 mg/dL 0.7  0.6    Alkaline Phos 38 - 126 U/L 74  55    AST 15 - 41 U/L 22  15    ALT 0 - 44 U/L 13  9      DIAGNOSTIC IMAGING:  I have independently reviewed the scans and discussed with the patient. NM PET Image Restag (PS) Skull Base To Thigh  Result Date: 05/28/2022 CLINICAL DATA:  Subsequent treatment strategy for non-small cell lung cancer. EXAM: NUCLEAR MEDICINE PET SKULL BASE TO THIGH TECHNIQUE: 6.75 mCi F-18 FDG was injected intravenously. Full-ring PET imaging was performed from the skull base to thigh after the radiotracer. CT data was obtained and used for attenuation correction and anatomic localization. Fasting blood glucose:  82 mg/dl COMPARISON:  Multiple prior imaging studies. Most recent PET-CT is 09/22/2021. Recent chest CT 05/14/2022 FINDINGS: Mediastinal blood pool activity: SUV max 1.64 Liver activity: SUV max NA NECK: No hypermetabolic lymph nodes in the neck. Incidental CT findings: Bilateral carotid artery calcifications are noted. CHEST: The treated medial left lower lobe pulmonary lesion overall appears relatively stable in size however, there are areas of hypermetabolism noted most notably the lateral and anterior aspect of lesion with SUV max of 5.34. Findings highly suspicious for residual recurrent tumor. No enlarged or hypermetabolic mediastinal or hilar lymph nodes. No new pulmonary nodules to suggest metastatic pulmonary disease. No hypermetabolic chest wall lesions, supraclavicular or axillary adenopathy. Incidental CT findings: Stable severe underlying emphysematous changes and extensive apical pleural and parenchymal scarring. Stable advanced vascular disease. ABDOMEN/PELVIS: No abnormal hypermetabolic activity within the liver, pancreas, adrenal glands, or spleen. No hypermetabolic lymph nodes in  the abdomen or pelvis. Incidental CT findings: Stable severe vascular disease and stable bilobed abdominal aortic aneurysm with maximum diameter of 3.5 cm. Recommend follow-up ultrasound every 2 years. This recommendation follows ACR consensus guidelines: White Paper of the ACR Incidental Findings Committee II on Vascular Findings. J Am Coll Radiol 2013; 10:789-794. SKELETON: No findings suspicious for osseous metastatic disease. Incidental CT findings: None. IMPRESSION: 1. The treated medial left lower lobe pulmonary lesion appears relatively stable in size however, there are areas of hypermetabolism most notably the lateral and anterior aspect of the lesion highly suspicious for residual or recurrent tumor. 2. No mediastinal or hilar adenopathy or evidence of metastatic pulmonary disease. 3. No findings for  abdominal/pelvic metastatic disease or osseous metastatic disease. 4. Stable advanced vascular disease. Stable 3.5 cm bilobed abdominal aortic aneurysm. Recommend follow-up ultrasound every 2 years. Electronically Signed   By: Marijo Sanes M.D.   On: 05/28/2022 10:34   CT Chest W Contrast  Result Date: 05/15/2022 CLINICAL DATA:  Left lower lobe non-small cell lung cancer status post radiation therapy. Restaging. * Tracking Code: BO * EXAM: CT CHEST WITH CONTRAST TECHNIQUE: Multidetector CT imaging of the chest was performed during intravenous contrast administration. RADIATION DOSE REDUCTION: This exam was performed according to the departmental dose-optimization program which includes automated exposure control, adjustment of the mA and/or kV according to patient size and/or use of iterative reconstruction technique. CONTRAST:  40m OMNIPAQUE IOHEXOL 300 MG/ML  SOLN COMPARISON:  11/23/2021 chest CT.  07/23/2021 PET-CT. FINDINGS: Cardiovascular: Normal heart size. No significant pericardial effusion/thickening. Three-vessel coronary atherosclerosis. Atherosclerotic nonaneurysmal thoracic aorta. Normal caliber pulmonary arteries. No central pulmonary emboli. Mediastinum/Nodes: No significant thyroid nodules. Unremarkable esophagus. No pathologically enlarged axillary, mediastinal or hilar lymph nodes. Lungs/Pleura: No pneumothorax. No pleural effusion. Severe centrilobular emphysema with diffuse bronchial wall thickening. Irregular solid masslike consolidation in anterior left lower lobe surrounding the fiducial marker measures 4.8 x 2.6 cm (series 4/image 123), previously 4.9 x 3.0 cm on 04/23/2022 chest CT angiogram study using similar measurement technique, not substantially changed. Tiny solid posterior right upper lobe 0.3 cm pulmonary nodule is stable (series 4/image 88). No acute consolidative airspace disease or new significant pulmonary nodules. Irregular mildly thickened subpleural bandlike  postinfectious scarring in the peripheral anterior basilar left lower lobe. Upper abdomen: No acute abnormality. Musculoskeletal:  No aggressive appearing focal osseous lesions. IMPRESSION: 1. Irregular solid masslike consolidation in the anterior left lower lobe surrounding the fiducial marker is not substantially changed since 04/23/2022 chest CT angiogram study, most compatible with treated tumor. No findings to suggest local tumor recurrence. Continued chest CT surveillance warranted. 2. No evidence of metastatic disease in the chest. 3. Three-vessel coronary atherosclerosis. 4. Aortic Atherosclerosis (ICD10-I70.0) and Emphysema (ICD10-J43.9). Electronically Signed   By: JIlona SorrelM.D.   On: 05/15/2022 18:24     ASSESSMENT:  1.  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 -Patient placed on O2 nasal cannula in the last 3 weeks for drop in sats on exertion. -PET scan on 01/28/2020 shows 2.6 cm left lower lobe nodule SUV 10.1.  Focal hypermetabolic them in the left hilum identified without discrete lymph node visible on CT.  10 mm short axis precarinal lymph node with low-level SUV 2.9.  7 mm short axis right hilar node with SUV 3.8. -Navigational bronchoscopy and biopsy of the left lower lobe lung lesion consistent with squamous cell  carcinoma. -IMRT to the left lower lung mass from 04/14/2020 through 04/24/2020, 50 Gy/ 5 fractions. - NGS testing on sample from 02/19/2020: Negative for BRAF V600 E.  MMR proficient.  PD-L1 22 C3, 28-8 and SP142 0%.  TRK A/B/C-. - Guardant360: Unconventional EGFR mutation -PET scan on 04/09/2021 showed left lower lobe lung mass measuring 2.1 x 2.2 cm, SUV 5.7.  Hilar hypermetabolic without well-defined adenopathy. - [name] Graham was evaluated by Dr. Sondra Come and was felt to be not a candidate for radiation or SBRT.    2.  Social/family history: -2 pack/day smoker for 68 years.   Currently smokes 4 cigarettes/day. 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 -Father had throat cancer.   PLAN:  1.  Clinical T1c N0 M0 squamous cell carcinoma of left lung lower lobe: - Recent CT scan on 05/14/2022 showed irregular solid masslike consolidation in the anterior left lower lobe surrounding the fiducial markers measuring 4.8 x 2.6 cm.  Lung mass has clearly increased in size since July. - We reviewed PET scan from 05/27/2022.  Treated medial left lower lobe lung lesion appears relatively stable in size with areas of hypermetabolism most notably in the lateral and anterior aspect of the lesion.  No adenopathy or evidence of metastatic disease. - We have discussed observation given his poor performance status.  [name] Graham is not a candidate for further radiation.  [name] Graham is not a surgical candidate.  We also discussed treatment option with minimal chemotherapy using 9 LA regimen.  We discussed side effects in detail.  [name] Graham is not interested at this time. - We will schedule follow-up in 4 months with repeat CT scan with contrast.  We will also do Guardant360 testing prior to next visit.  Orders placed this encounter:  Orders Placed This Encounter  Procedures   CT Chest W Contrast     I,Alexis Herring,acting as a scribe for Derek Jack, MD.,have documented all relevant documentation on the behalf of Derek Jack, MD,as directed by  Derek Jack, MD while in the presence of Derek Jack, MD.  I, Derek Jack MD, have reviewed the above documentation for accuracy and completeness, and I agree with the above.    Derek Jack, MD [place] [phone]

## 2022-06-03 ENCOUNTER — Telehealth: Payer: Self-pay | Admitting: Internal Medicine

## 2022-06-03 MED ORDER — BUDESONIDE-FORMOTEROL FUMARATE 80-4.5 MCG/ACT IN AERO: INHALATION_SPRAY | RESPIRATORY_TRACT | 11 refills | Status: DC

## 2022-06-03 NOTE — Telephone Encounter (Signed)
Patient called to let the nurse or doctor know that his pharmacy does not a script for his inhaler, Symbicort.  Please advise and call the patient to give him an update.  CB# [phone]

## 2022-06-03 NOTE — Telephone Encounter (Signed)
Called and spoke with patient. He is aware that I have sent in the refill for him.   Nothing further needed at time of call.

## 2022-06-03 NOTE — Telephone Encounter (Signed)
Called and left voicemail for patient to call office back to let us know what pharmacy he would like [place] to send his Symbicort refills into.

## 2022-06-07 DIAGNOSIS — J449 Chronic obstructive pulmonary disease, unspecified: Secondary | ICD-10-CM | POA: Diagnosis not present

## 2022-06-07 DIAGNOSIS — I2510 Atherosclerotic heart disease of native coronary artery without angina pectoris: Secondary | ICD-10-CM | POA: Diagnosis not present

## 2022-06-16 DIAGNOSIS — H269 Unspecified cataract: Secondary | ICD-10-CM | POA: Diagnosis not present

## 2022-06-16 DIAGNOSIS — H25811 Combined forms of age-related cataract, right eye: Secondary | ICD-10-CM | POA: Diagnosis not present

## 2022-06-17 ENCOUNTER — Other Ambulatory Visit: Payer: Self-pay | Admitting: Cardiology

## 2022-06-17 ENCOUNTER — Telehealth: Payer: Self-pay | Admitting: Cardiology

## 2022-06-17 NOTE — Telephone Encounter (Signed)
Patient called and said he have to get approval from Dr. Domenic Polite to get his Plavix medication

## 2022-06-17 NOTE — Telephone Encounter (Signed)
Refill plavix faxed to pharmacy per fax request

## 2022-06-24 ENCOUNTER — Telehealth: Payer: Self-pay | Admitting: Cardiology

## 2022-06-24 MED ORDER — OLMESARTAN MEDOXOMIL 40 MG PO TABS: 40.0000 mg | ORAL_TABLET | Freq: Every day | ORAL | 1 refills | Status: DC

## 2022-06-24 NOTE — Telephone Encounter (Signed)
*  STAT* If patient is at the pharmacy, call can be transferred to refill team.   1. Which medications need to be refilled? (please list name of each medication and dose if known)   olmesartan (BENICAR) 40 MG tablet    2. Which pharmacy/location (including street and city if local pharmacy) is medication to be sent to?  [place], [address]    3. Do they need a 30 day or 90 day supply? [place]

## 2022-06-24 NOTE — Telephone Encounter (Signed)
Completed.

## 2022-07-06 DIAGNOSIS — I2510 Atherosclerotic heart disease of native coronary artery without angina pectoris: Secondary | ICD-10-CM | POA: Diagnosis not present

## 2022-07-06 DIAGNOSIS — J449 Chronic obstructive pulmonary disease, unspecified: Secondary | ICD-10-CM | POA: Diagnosis not present

## 2022-07-13 ENCOUNTER — Other Ambulatory Visit: Payer: Self-pay | Admitting: Internal Medicine

## 2022-07-15 DIAGNOSIS — H524 Presbyopia: Secondary | ICD-10-CM | POA: Diagnosis not present

## 2022-07-15 DIAGNOSIS — H52223 Regular astigmatism, bilateral: Secondary | ICD-10-CM | POA: Diagnosis not present

## 2022-07-28 ENCOUNTER — Other Ambulatory Visit: Payer: Self-pay | Admitting: Internal Medicine

## 2022-07-28 ENCOUNTER — Other Ambulatory Visit: Payer: Self-pay | Admitting: Cardiology

## 2022-07-30 DIAGNOSIS — I2510 Atherosclerotic heart disease of native coronary artery without angina pectoris: Secondary | ICD-10-CM | POA: Diagnosis not present

## 2022-07-30 DIAGNOSIS — I255 Ischemic cardiomyopathy: Secondary | ICD-10-CM | POA: Diagnosis not present

## 2022-07-30 DIAGNOSIS — E785 Hyperlipidemia, unspecified: Secondary | ICD-10-CM | POA: Diagnosis not present

## 2022-07-30 DIAGNOSIS — J439 Emphysema, unspecified: Secondary | ICD-10-CM | POA: Diagnosis not present

## 2022-07-30 DIAGNOSIS — E039 Hypothyroidism, unspecified: Secondary | ICD-10-CM | POA: Diagnosis not present

## 2022-07-30 DIAGNOSIS — M4800 Spinal stenosis, site unspecified: Secondary | ICD-10-CM | POA: Diagnosis not present

## 2022-07-30 DIAGNOSIS — I252 Old myocardial infarction: Secondary | ICD-10-CM | POA: Diagnosis not present

## 2022-07-30 DIAGNOSIS — M1990 Unspecified osteoarthritis, unspecified site: Secondary | ICD-10-CM | POA: Diagnosis not present

## 2022-07-30 DIAGNOSIS — K219 Gastro-esophageal reflux disease without esophagitis: Secondary | ICD-10-CM | POA: Diagnosis not present

## 2022-07-30 DIAGNOSIS — I471 Supraventricular tachycardia, unspecified: Secondary | ICD-10-CM | POA: Diagnosis not present

## 2022-07-30 DIAGNOSIS — I10 Essential (primary) hypertension: Secondary | ICD-10-CM | POA: Diagnosis not present

## 2022-07-30 DIAGNOSIS — M62838 Other muscle spasm: Secondary | ICD-10-CM | POA: Diagnosis not present

## 2022-07-31 DIAGNOSIS — R0981 Nasal congestion: Secondary | ICD-10-CM | POA: Diagnosis not present

## 2022-07-31 DIAGNOSIS — J329 Chronic sinusitis, unspecified: Secondary | ICD-10-CM | POA: Diagnosis not present

## 2022-07-31 DIAGNOSIS — R509 Fever, unspecified: Secondary | ICD-10-CM | POA: Diagnosis not present

## 2022-08-06 DIAGNOSIS — I2510 Atherosclerotic heart disease of native coronary artery without angina pectoris: Secondary | ICD-10-CM | POA: Diagnosis not present

## 2022-08-06 DIAGNOSIS — J449 Chronic obstructive pulmonary disease, unspecified: Secondary | ICD-10-CM | POA: Diagnosis not present

## 2022-08-16 ENCOUNTER — Telehealth: Payer: Self-pay | Admitting: Internal Medicine

## 2022-08-16 NOTE — Telephone Encounter (Signed)
Pt needs to know the exact date pt was diagnosed w lung cancer.

## 2022-08-17 NOTE — Telephone Encounter (Signed)
Called patient and he states he is needing to know the exact date that he was diagnosed with lung cancer.   Please advise sir

## 2022-08-17 NOTE — Telephone Encounter (Signed)
Called patient and gave him that date that he was needing for his records. Nothing further needed

## 2022-08-17 NOTE — Telephone Encounter (Signed)
Dr Delton Coombes did the bx/ Dr Ellin Saba  the treatment if he has further questions   Diagnosed by [facility] on 02/19/2020.

## 2022-08-18 DIAGNOSIS — Z961 Presence of intraocular lens: Secondary | ICD-10-CM | POA: Diagnosis not present

## 2022-08-18 DIAGNOSIS — S0501XA Injury of conjunctiva and corneal abrasion without foreign body, right eye, initial encounter: Secondary | ICD-10-CM | POA: Diagnosis not present

## 2022-09-05 DIAGNOSIS — J449 Chronic obstructive pulmonary disease, unspecified: Secondary | ICD-10-CM | POA: Diagnosis not present

## 2022-09-05 DIAGNOSIS — I2510 Atherosclerotic heart disease of native coronary artery without angina pectoris: Secondary | ICD-10-CM | POA: Diagnosis not present

## 2022-09-16 ENCOUNTER — Other Ambulatory Visit: Payer: Self-pay | Admitting: Cardiology

## 2022-09-29 ENCOUNTER — Ambulatory Visit (HOSPITAL_COMMUNITY)
Admission: RE | Admit: 2022-09-29 | Discharge: 2022-09-29 | Disposition: A | Discharge status: Home / Self Care | Payer: Medicare HMO | Source: Ambulatory Visit | Attending: Hematology | Admitting: Hematology

## 2022-09-29 ENCOUNTER — Inpatient Hospital Stay: Payer: Medicare HMO | Attending: Hematology

## 2022-09-29 DIAGNOSIS — C3491 Malignant neoplasm of unspecified part of right bronchus or lung: Secondary | ICD-10-CM | POA: Insufficient documentation

## 2022-09-29 DIAGNOSIS — C3490 Malignant neoplasm of unspecified part of unspecified bronchus or lung: Secondary | ICD-10-CM | POA: Diagnosis not present

## 2022-09-29 DIAGNOSIS — J439 Emphysema, unspecified: Secondary | ICD-10-CM | POA: Diagnosis not present

## 2022-09-29 DIAGNOSIS — I700 Atherosclerosis of aorta: Secondary | ICD-10-CM | POA: Diagnosis not present

## 2022-09-29 LAB — COMPREHENSIVE METABOLIC PANEL
ALT: 11 U/L (ref 0–44)
AST: 17 U/L (ref 15–41)
Albumin: 4 g/dL (ref 3.5–5.0)
Alkaline Phosphatase: 72 U/L (ref 38–126)
Anion gap: 12 (ref 5–15)
BUN: 17 mg/dL (ref 8–23)
CO2: 22 mmol/L (ref 22–32)
Calcium: 9.1 mg/dL (ref 8.9–10.3)
Chloride: 101 mmol/L (ref 98–111)
Creatinine, Ser: 1.17 mg/dL (ref 0.61–1.24)
GFR, Estimated: >60 mL/min (ref >60)
Glucose, Bld: 96 mg/dL (ref 70–99)
Potassium: 4.1 mmol/L (ref 3.5–5.1)
Sodium: 135 mmol/L (ref 135–145)
Total Bilirubin: 0.7 mg/dL (ref 0.3–1.2)
Total Protein: 7.8 g/dL (ref 6.5–8.1)

## 2022-09-29 LAB — CBC WITH DIFFERENTIAL/PLATELET
Abs Immature Granulocytes: 0.02 10*3/uL (ref 0.00–0.07)
Basophils Absolute: 0.1 10*3/uL (ref 0.0–0.1)
Basophils Relative: 1 %
Eosinophils Absolute: 0.3 10*3/uL (ref 0.0–0.5)
Eosinophils Relative: 4 %
HCT: 46.6 % (ref 39.0–52.0)
Hemoglobin: 15.6 g/dL (ref 13.0–17.0)
Immature Granulocytes: 0 %
Lymphocytes Relative: 28 %
Lymphs Abs: 2.1 10*3/uL (ref 0.7–4.0)
MCH: 31.4 pg (ref 26.0–34.0)
MCHC: 33.5 g/dL (ref 30.0–36.0)
MCV: 93.8 fL (ref 80.0–100.0)
Monocytes Absolute: 0.7 10*3/uL (ref 0.1–1.0)
Monocytes Relative: 9 %
Neutro Abs: 4.4 10*3/uL (ref 1.7–7.7)
Neutrophils Relative %: 58 %
Platelets: 260 10*3/uL (ref 150–400)
RBC: 4.97 MIL/uL (ref 4.22–5.81)
RDW: 13.6 % (ref 11.5–15.5)
WBC: 7.6 10*3/uL (ref 4.0–10.5)
nRBC: 0 % (ref 0.0–0.2)

## 2022-09-29 MED ORDER — IOHEXOL 300 MG/ML  SOLN
75.0000 mL | Freq: Once | INTRAMUSCULAR | Status: AC | PRN
  Administered 2022-09-29: 75 mL via INTRAVENOUS

## 2022-10-04 ENCOUNTER — Encounter: Payer: Self-pay | Admitting: *Deleted

## 2022-10-04 NOTE — Progress Notes (Signed)
Ct Chest results called from [facility] and Dr. Elbert Ewings aware.

## 2022-10-05 NOTE — Progress Notes (Signed)
[facility] [address], [place] [address]    Clinic Day:  10/06/2022  Referring physician: Deatra Canter, FNP  Patient Care Team: Deatra Canter, FNP as PCP - General (Family Medicine) Jonelle Sidle, MD as PCP - Cardiology (Cardiology) Jena Gauss Gerrit Friends, MD as Consulting Physician (Gastroenterology) Therese Sarah, RN as Oncology Nurse Navigator (Oncology) Doreatha Massed, MD as Medical Oncologist (Medical Oncology)   ASSESSMENT & PLAN:   Assessment: 1.  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 -Patient placed on O2 nasal cannula in the last 3 weeks for drop in sats on exertion. -PET scan on 01/28/2020 shows 2.6 cm left lower lobe nodule SUV 10.1.  Focal hypermetabolic them in the left hilum identified without discrete lymph node visible on CT.  10 mm short axis precarinal lymph node with low-level SUV 2.9.  7 mm short axis right hilar node with SUV 3.8. -Navigational bronchoscopy and biopsy of the left lower lobe lung lesion consistent with squamous cell carcinoma. -IMRT to the left lower lung mass from 04/14/2020 through 04/24/2020, 50 Gy/ 5 fractions. - NGS testing on sample from 02/19/2020: Negative for BRAF V600 E.  MMR proficient.  PD-L1 22 C3, 28-8 and SP142 0%.  TRK A/B/C-. - Guardant360: Unconventional EGFR mutation -PET scan on 04/09/2021 showed left lower lobe lung mass measuring 2.1 x 2.2 cm, SUV 5.7.  Hilar hypermetabolic without well-defined adenopathy. - He was evaluated by Dr. Roselind Messier and was felt to be not a candidate for radiation or SBRT.   2.  Social/family history: -2 pack/day smoker for 68 years.  Currently smoking 1 pack/day.  Lives with wife and is independent of ADLs and IADLs. 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 -Father had throat cancer.    Plan: 1.  Clinical  T1c N0 M0 squamous cell carcinoma of left lung lower lobe: - Reviewed CT chest from 09/29/2022.  Left lower lobe masslike consolidation measures 5 x 2.2 cm, gradually increasing. - This is consistent with local recurrence.  He is not a candidate for surgery or radiation. - We talked about initiating therapy with 9LA based regimen with 2 cycles of chemoimmunotherapy with opdivo every 3 weeks and Yervoy every 6 weeks followed by immunotherapy. - We will wait for Guardant360 results which are pending at this time.  If there are no mutations, he will proceed with above regimen. - At that time, we will request port placement.  2.  Weight loss: - He lost 4.6 pounds since last visit.  Appetite is low. - Recommended nutritional supplement like boost.    No orders of the defined types were placed in this encounter.     I,Katie Daubenspeck,acting as a Neurosurgeon for Doreatha Massed, MD.,have documented all relevant documentation on the behalf of Doreatha Massed, MD,as directed by  Doreatha Massed, MD while in the presence of Doreatha Massed, MD.   I, Doreatha Massed MD, have reviewed the above documentation for accuracy and completeness, and I agree with the above.   Doreatha Massed, MD   6/5/20246:11 PM  CHIEF COMPLAINT:   Diagnosis: LLL squamous cell lung cancer   Cancer Staging  No matching staging information was found for the patient.   Prior Therapy: SBRT to LLL, 04/14/20 - 04/24/20  Current Therapy:  surveillance   HISTORY OF PRESENT ILLNESS:   Oncology  History  Squamous cell lung cancer, left (HCC)  12/08/2016 Initial Diagnosis   Squamous cell lung cancer, left (HCC)   11/12/2020 Imaging   1. LEFT infrahilar lesion of similar size compared to most recent study with evolving post treatment changes and diminished basilar nodularity that was seen previously. 2. Basilar nodularity discussed above likely post inflammatory or infectious, attention on follow-up.  No new or progressive findings. 3. Diminishing size of mediastinal lymph nodes and particularly the dominant node anterior to the carina. 4. Three-vessel coronary artery disease. 5. Emphysema and aortic atherosclerosis.        INTERVAL HISTORY:   [name] Graham is a 87 y.o. male presenting to clinic today for follow up of LLL squamous cell lung cancer. He was last seen by me on 06/01/22.  Since his last visit, he underwent surveillance chest CT on 09/29/22 showing: increased size of masslike consolidation of LLL, currently 5 cm and concerning for recurrent disease.  Today, he states that he is doing well overall. His appetite level is at 100%. His energy level is at 10%.  PAST MEDICAL HISTORY:   Past Medical History: Past Medical History:  Diagnosis Date   Arthritis    COPD (chronic obstructive pulmonary disease) (HCC)    Coronary atherosclerosis of native coronary artery    BMS circ and PTCA PDA 2007, DES RCA 10/09, DES LAD 2/12   Essential hypertension    GERD (gastroesophageal reflux disease)    Hearing loss    Heart attack (HCC)    x 3 (latest 2009)   History of kidney stones    Hyperlipidemia    Ischemic cardiomyopathy    NSTEMI (non-ST elevated myocardial infarction) (HCC)    2007   Squamous cell lung cancer [facility])     Surgical History: Past Surgical History:  Procedure Laterality Date   ANGIOPLASTY     stent   BRONCHIAL BIOPSY  02/19/2020   Procedure: BRONCHIAL BIOPSIES;  Surgeon: Leslye Peer, MD;  Location: [facility] ENDOSCOPY;  Service: Pulmonary;;   BRONCHIAL BRUSHINGS  02/19/2020   Procedure: BRONCHIAL BRUSHINGS;  Surgeon: Leslye Peer, MD;  Location[facility] ENDOSCOPY;  Service: Pulmonary;;   BRONCHIAL NEEDLE ASPIRATION BIOPSY  02/19/2020   Procedure: BRONCHIAL NEEDLE ASPIRATION BIOPSIES;  Surgeon: Leslye Peer, MD;  Location: [facility] ENDOSCOPY;  Service: Pulmonary;;   COLONOSCOPY     FIDUCIAL MARKER PLACEMENT  02/19/2020   Procedure: FIDUCIAL MARKER PLACEMENT;  Surgeon:  Leslye Peer, MD;  Location: [facility] ENDOSCOPY;  Service: Pulmonary;;   HERNIA REPAIR     LIPOMA EXCISION Right    hip/buttox   TONSILLECTOMY     as a child   VIDEO BRONCHOSCOPY WITH ENDOBRONCHIAL NAVIGATION N/A 02/19/2020   Procedure: VIDEO BRONCHOSCOPY WITH ENDOBRONCHIAL NAVIGATION;  Surgeon: Leslye Peer, MD;  Location: [facility] ENDOSCOPY;  Service: Pulmonary;  Laterality: N/A;    Social History: Social History   Socioeconomic History   Marital status: Married    Spouse name: Not on file   Number of children: Not on file   Years of education: Not on file   Highest education level: Not on file  Occupational History   Occupation: Retired  Tobacco Use   Smoking status: Every Day    Packs/day: 0.50    Years: 63.00    Additional pack years: 0.00    Total pack years: 31.50    Types: Cigarettes    Start date: 06/15/1951   Smokeless tobacco: Never   Tobacco comments:    10 cigarettes a  day [facility] 08/26/2021  Vaping Use   Vaping Use: Never used  Substance and Sexual Activity   Alcohol use: No    Alcohol/week: 0.0 standard drinks of alcohol   Drug use: No   Sexual activity: Not Currently  Other Topics Concern   Not on file  Social History Narrative   Not on file   Social Determinants of Health   Financial Resource Strain: Low Risk  (02/28/2020)   Overall Financial Resource Strain (CARDIA)    Difficulty of Paying Living Expenses: Not hard at all  Food Insecurity: No Food Insecurity (02/28/2020)   Hunger Vital Sign    Worried About Running Out of Food in the Last Year: Never true    Ran Out of Food in the Last Year: Never true  Transportation Needs: No Transportation Needs (02/28/2020)   PRAPARE - Administrator, Civil Service (Medical): No    Lack of Transportation (Non-Medical): No  Physical Activity: Insufficiently Active (02/28/2020)   Exercise Vital Sign    Days of Exercise per Week: 7 days    Minutes of Exercise per Session: 10 min  Stress: Stress Concern  Present (02/28/2020)   [employer] of Occupational Health - Occupational Stress Questionnaire    Feeling of Stress : Very much  Social Connections: Socially Isolated (02/28/2020)   Social Connection and Isolation Panel [NHANES]    Frequency of Communication with Friends and Family: More than three times a week    Frequency of Social Gatherings with Friends and Family: More than three times a week    Attends Religious Services: Never    Database administrator or Organizations: No    Attends Banker Meetings: Never    Marital Status: Divorced  Catering manager Violence: Not At Risk (02/28/2020)   Humiliation, Afraid, Rape, and Kick questionnaire    Fear of Current or Ex-Partner: No    Emotionally Abused: No    Physically Abused: No    Sexually Abused: No    Family History: Family History  Problem Relation Age of Onset   Throat cancer Father    Heart attack Mother    Alzheimer's disease Mother     Current Medications:  Current Outpatient Medications:    albuterol (VENTOLIN HFA) 108 (90 Base) MCG/ACT inhaler, Inhale into the lungs every 6 (six) hours as needed for wheezing or shortness of breath., Disp: , Rfl:    amLODipine (NORVASC) 5 MG tablet, Take 1 tablet by mouth once daily, Disp: 90 tablet, Rfl: 2   aspirin EC 81 MG tablet, Take 1 tablet by mouth daily., Disp: , Rfl:    budesonide-formoterol (SYMBICORT) 80-4.5 MCG/ACT inhaler, 2 puffs on days prior to heavy activity, Disp: 1 each, Rfl: 11   clopidogrel (PLAVIX) 75 MG tablet, Take 1 tablet by mouth once daily, Disp: 90 tablet, Rfl: 3   cyclobenzaprine (FLEXERIL) 5 MG tablet, Take 1 tablet by mouth three times daily as needed for muscle spasm, Disp: 90 tablet, Rfl: 0   famotidine (PEPCID) 20 MG tablet, TAKE 1 TABLET BY MOUTH ONCE DAILY AFTER  SUPPER, Disp: 90 tablet, Rfl: 0   HYDROcodone-acetaminophen (NORCO) 7.5-325 MG tablet, Take 1 tablet by mouth 2 (two) times daily as needed., Disp: , Rfl:     levofloxacin (LEVAQUIN) 750 MG tablet, Take 750 mg by mouth daily., Disp: , Rfl:    levothyroxine (SYNTHROID) 50 MCG tablet, TAKE 1 TABLET BY MOUTH ONCE DAILY BEFORE BREAKFAST, Disp: 90 tablet, Rfl: 0   metoprolol succinate (  TOPROL XL) 25 MG 24 hr tablet, Take 1 tablet (25 mg total) by mouth daily., Disp: 90 tablet, Rfl: 3   Naphazoline HCl (CLEAR EYES OP), Place 1 drop into both eyes daily as needed (irritation / redness)., Disp: , Rfl:    nitroGLYCERIN (NITROSTAT) 0.4 MG SL tablet, DISSOLVE ONE TABLET UNDER THE TONGUE EVERY 5 MINUTES AS NEEDED FOR CHEST PAIN.  DO NOT EXCEED A TOTAL OF 3 DOSES IN 15 MINUTES, Disp: 25 tablet, Rfl: 3   olmesartan (BENICAR) 40 MG tablet, Take 1 tablet (40 mg total) by mouth daily., Disp: 90 tablet, Rfl: 1   OXYGEN, Inhale into the lungs., Disp: , Rfl:    pantoprazole (PROTONIX) 40 MG tablet, TAKE 1 TABLET BY MOUTH ONCE DAILY 30 TO 60 MINUTES BEFORE FIRST MEAL OF THE DAY, Disp: 90 tablet, Rfl: 1   rosuvastatin (CRESTOR) 20 MG tablet, Take 1 tablet (20 mg total) by mouth daily., Disp: 90 tablet, Rfl: 3   simvastatin (ZOCOR) 80 MG tablet, Take by mouth., Disp: , Rfl:    Allergies: Allergies  Allergen Reactions   Entresto [Sacubitril-Valsartan] Itching    REVIEW OF SYSTEMS:   Review of Systems  Constitutional:  Negative for chills, fatigue and fever.  HENT:   Negative for lump/mass, mouth sores, nosebleeds, sore throat and trouble swallowing.   Eyes:  Negative for eye problems.  Respiratory:  Positive for shortness of breath. Negative for cough.   Cardiovascular:  Negative for chest pain, leg swelling and palpitations.  Gastrointestinal:  Negative for abdominal pain, constipation, diarrhea, nausea and vomiting.  Genitourinary:  Negative for bladder incontinence, difficulty urinating, dysuria, frequency, hematuria and nocturia.   Musculoskeletal:  Negative for arthralgias, back pain, flank pain, myalgias and neck pain.  Skin:  Negative for itching and rash.   Neurological:  Positive for dizziness and numbness. Negative for headaches.  Hematological:  Does not bruise/bleed easily.  Psychiatric/Behavioral:  Negative for depression, sleep disturbance and suicidal ideas. The patient is not nervous/anxious.   All other systems reviewed and are negative.    VITALS:   Blood pressure 138/76, pulse 77, temperature 97.8 F (36.6 C), temperature source Oral, resp. rate 18, height 5\' 10"  (1.778 m), weight 125 lb 6.4 oz (56.9 kg), SpO2 97 %.  Wt Readings from Last 3 Encounters:  10/06/22 125 lb 6.4 oz (56.9 kg)  06/01/22 130 lb 8 oz (59.2 kg)  05/19/22 131 lb 6.4 oz (59.6 kg)    Body mass index is 17.99 kg/m.  Performance status (ECOG): 1 - Symptomatic but completely ambulatory  PHYSICAL EXAM:   Physical Exam Vitals and nursing note reviewed. Exam conducted with a chaperone present.  Constitutional:      Appearance: Normal appearance.  Cardiovascular:     Rate and Rhythm: Normal rate and regular rhythm.     Pulses: Normal pulses.     Heart sounds: Normal heart sounds.  Pulmonary:     Effort: Pulmonary effort is normal.     Breath sounds: Normal breath sounds.  Abdominal:     Palpations: Abdomen is soft. There is no hepatomegaly, splenomegaly or mass.     Tenderness: There is no abdominal tenderness.  Musculoskeletal:     Right lower leg: No edema.     Left lower leg: No edema.  Lymphadenopathy:     Cervical: No cervical adenopathy.     Right cervical: No superficial, deep or posterior cervical adenopathy.    Left cervical: No superficial, deep or posterior cervical adenopathy.  Upper Body:     Right upper body: No supraclavicular or axillary adenopathy.     Left upper body: No supraclavicular or axillary adenopathy.  Neurological:     General: No focal deficit present.     Mental Status: He is alert and oriented to person, place, and time.  Psychiatric:        Mood and Affect: Mood normal.        Behavior: Behavior normal.      LABS:      Latest Ref Rng & Units 09/29/2022    9:30 AM 05/14/2022    1:00 PM 11/23/2021    9:48 AM  CBC  WBC 4.0 - 10.5 K/uL 7.6  7.5  6.8   Hemoglobin 13.0 - 17.0 g/dL 16.1  09.6  04.5   Hematocrit 39.0 - 52.0 % 46.6  45.2  42.1   Platelets 150 - 400 K/uL 260  259  241       Latest Ref Rng & Units 09/29/2022    9:30 AM 05/14/2022    1:00 PM 11/23/2021    9:48 AM  CMP  Glucose 70 - 99 mg/dL 96  409  811   BUN 8 - 23 mg/dL 17  22  16    Creatinine 0.61 - 1.24 mg/dL 9.14  7.82  9.56   Sodium 135 - 145 mmol/L 135  134  136   Potassium 3.5 - 5.1 mmol/L 4.1  5.1  4.3   Chloride 98 - 111 mmol/L 101  100  106   CO2 22 - 32 mmol/L 22  23  25    Calcium 8.9 - 10.3 mg/dL 9.1  9.3  8.8   Total Protein 6.5 - 8.1 g/dL 7.8  7.8  7.0   Total Bilirubin 0.3 - 1.2 mg/dL 0.7  0.7  0.6   Alkaline Phos 38 - 126 U/L 72  74  55   AST 15 - 41 U/L 17  22  15    ALT 0 - 44 U/L 11  13  9       No results found for: "CEA1", "CEA" / No results found for: "CEA1", "CEA" No results found for: "PSA1" No results found for: "OZH086" No results found for: "CAN125"  No results found for: "TOTALPROTELP", "ALBUMINELP", "A1GS", "A2GS", "BETS", "BETA2SER", "GAMS", "MSPIKE", "SPEI" No results found for: "TIBC", "FERRITIN", "IRONPCTSAT" No results found for: "LDH"   STUDIES:   CT Chest W Contrast  Result Date: 10/03/2022 CLINICAL DATA:  Non-small cell lung cancer; * Tracking Code: BO * EXAM: CT CHEST WITH CONTRAST TECHNIQUE: Multidetector CT imaging of the chest was performed during intravenous contrast administration. RADIATION DOSE REDUCTION: This exam was performed according to the departmental dose-optimization program which includes automated exposure control, adjustment of the mA and/or kV according to patient size and/or use of iterative reconstruction technique. CONTRAST:  75mL OMNIPAQUE IOHEXOL 300 MG/ML  SOLN COMPARISON:  PET-CT dated May 27, 2022; chest CT dated May 14, 2018 FINDINGS:  Cardiovascular: Normal heart size. No pericardial effusion. Normal caliber thoracic aorta with severe atherosclerotic disease including areas of ulcerated soft plaque. Severe three-vessel coronary artery calcifications. Mediastinum/Nodes: Esophagus and thyroid are unremarkable. No enlarged lymph nodes seen in the chest. Lungs/Pleura: Central airways are patent. Severe centrilobular emphysema. Stable biapical pleural-parenchymal scarring. Masslike consolidation of the left lower lobe with associated fiducial marker is slightly increased in size when compared with prior exam, particularly along the anterior aspect which correlates with area of hypermetabolic activity. Measures 5.0 x 2.2 cm on  series 2, image 121, previously 4.7 x 2.1 cm, when measured similar location. Additionally, there is new occlusion of the posterior left lower lobe bronchus. No pleural effusion or pneumothorax. Upper Abdomen: No acute abnormality. Musculoskeletal: No chest wall abnormality. No acute or significant osseous findings. IMPRESSION: 1. Increased size of masslike consolidation of the left lower lobe, particularly along the anterior aspect which correlates with area of hypermetabolic activity seen on prior PET-CT, findings are concerning for recurrent disease. Consider tissue sampling for further evaluation. 2. Aortic Atherosclerosis (ICD10-I70.0) and Emphysema (ICD10-J43.9). Electronically Signed   By: Allegra Lai M.D.   On: 10/03/2022 20:03

## 2022-10-06 ENCOUNTER — Inpatient Hospital Stay: Payer: Medicare HMO | Attending: Hematology | Admitting: Hematology

## 2022-10-06 VITALS — BP 138/76 | HR 77 | Temp 97.8°F | Resp 18 | Ht 70.0 in | Wt 125.4 lb

## 2022-10-06 DIAGNOSIS — E785 Hyperlipidemia, unspecified: Secondary | ICD-10-CM | POA: Insufficient documentation

## 2022-10-06 DIAGNOSIS — I2510 Atherosclerotic heart disease of native coronary artery without angina pectoris: Secondary | ICD-10-CM | POA: Diagnosis not present

## 2022-10-06 DIAGNOSIS — I10 Essential (primary) hypertension: Secondary | ICD-10-CM | POA: Insufficient documentation

## 2022-10-06 DIAGNOSIS — R634 Abnormal weight loss: Secondary | ICD-10-CM | POA: Insufficient documentation

## 2022-10-06 DIAGNOSIS — Z792 Long term (current) use of antibiotics: Secondary | ICD-10-CM | POA: Diagnosis not present

## 2022-10-06 DIAGNOSIS — C3432 Malignant neoplasm of lower lobe, left bronchus or lung: Secondary | ICD-10-CM | POA: Insufficient documentation

## 2022-10-06 DIAGNOSIS — Z87442 Personal history of urinary calculi: Secondary | ICD-10-CM | POA: Insufficient documentation

## 2022-10-06 DIAGNOSIS — I700 Atherosclerosis of aorta: Secondary | ICD-10-CM | POA: Diagnosis not present

## 2022-10-06 DIAGNOSIS — J449 Chronic obstructive pulmonary disease, unspecified: Secondary | ICD-10-CM | POA: Insufficient documentation

## 2022-10-06 DIAGNOSIS — Z7982 Long term (current) use of aspirin: Secondary | ICD-10-CM | POA: Diagnosis not present

## 2022-10-06 DIAGNOSIS — C3492 Malignant neoplasm of unspecified part of left bronchus or lung: Secondary | ICD-10-CM

## 2022-10-06 DIAGNOSIS — Z79890 Hormone replacement therapy (postmenopausal): Secondary | ICD-10-CM | POA: Insufficient documentation

## 2022-10-06 DIAGNOSIS — K219 Gastro-esophageal reflux disease without esophagitis: Secondary | ICD-10-CM | POA: Insufficient documentation

## 2022-10-06 DIAGNOSIS — I252 Old myocardial infarction: Secondary | ICD-10-CM | POA: Diagnosis not present

## 2022-10-06 DIAGNOSIS — I255 Ischemic cardiomyopathy: Secondary | ICD-10-CM | POA: Insufficient documentation

## 2022-10-06 DIAGNOSIS — Z79899 Other long term (current) drug therapy: Secondary | ICD-10-CM | POA: Insufficient documentation

## 2022-10-06 DIAGNOSIS — F17210 Nicotine dependence, cigarettes, uncomplicated: Secondary | ICD-10-CM | POA: Insufficient documentation

## 2022-10-06 DIAGNOSIS — J432 Centrilobular emphysema: Secondary | ICD-10-CM | POA: Insufficient documentation

## 2022-10-06 DIAGNOSIS — Z7951 Long term (current) use of inhaled steroids: Secondary | ICD-10-CM | POA: Diagnosis not present

## 2022-10-06 DIAGNOSIS — Z7902 Long term (current) use of antithrombotics/antiplatelets: Secondary | ICD-10-CM | POA: Diagnosis not present

## 2022-10-06 NOTE — Patient Instructions (Addendum)
[facility] at [facility] Discharge Instructions   You were seen and examined today by Dr. Ellin Saba.  He reviewed the results of your CT scan. It is showing the cancer in the lung is slowly growing. The only treatment option would be for you to receive a couple of cycles of chemotherapy combined with immunotherapy drugs. Chemotherapy is given for 2 cycles, then the chemo falls off and you continue to come to the clinic every 3 weeks to receive the immunotherapy drugs.   We are awaiting the results of the special blood test we sent to see if you have a mutation that would make you eligible to take a pill to treat this cancer.   Return as scheduled.    Thank you for choosing [facility] at [facility] to provide your oncology and hematology care.  To afford each patient quality time with our provider, please arrive at least 15 minutes before your scheduled appointment time.   If you have a lab appointment with the Cancer Center please come in thru the Main Entrance and check in at the main information desk.  You need to re-schedule your appointment should you arrive 10 or more minutes late.  We strive to give you quality time with our providers, and arriving late affects you and other patients whose appointments are after yours.  Also, if you no show three or more times for appointments you may be dismissed from the clinic at the providers discretion.     Again, thank you for choosing [facility].  Our hope is that these requests will decrease the amount of time that you wait before being seen by our physicians.       _____________________________________________________________  Should you have questions after your visit to [facility], please contact our office at [phone] and follow the prompts.  Our office hours are 8:00 a.m. and 4:30 p.m. Monday - Friday.  Please note that voicemails left after 4:00 p.m. may not be  returned until the following business day.  We are closed weekends and major holidays.  You do have access to a nurse 24-7, just call the main number to the clinic [phone] and do not press any options, hold on the line and a nurse will answer the phone.    For prescription refill requests, have your pharmacy contact our office and allow 72 hours.    Due to Covid, you will need to wear a mask upon entering the hospital. If you do not have a mask, a mask will be given to you at the Main Entrance upon arrival. For doctor visits, patients may have 1 support person age 51 or older with them. For treatment visits, patients can not have anyone with them due to social distancing guidelines and our immunocompromised population.

## 2022-10-07 ENCOUNTER — Telehealth: Payer: Self-pay | Admitting: Cardiology

## 2022-10-07 NOTE — Telephone Encounter (Signed)
Patient scheduled for 06/11 @ 3 pm in the [place] office. Patient notified and verbalized understanding. Patient had no concerns at this time.

## 2022-10-07 NOTE — Telephone Encounter (Signed)
New Message:      Patient called and said he has been diagnosed with lung cancer. He says he needs to see Dr [name] Graham asap please. He says they have give him some choices. He does not want to do anything or agree to anything until he see Dr Dr [name] Graham. Dr [name] Graham first available appointment is in October. He said he does not want to see an  APP, he says nobody knows him like Dr [name] Graham.

## 2022-10-07 NOTE — Telephone Encounter (Signed)
I will forward to Dr.McDowell for review and advice.

## 2022-10-11 ENCOUNTER — Encounter: Payer: Self-pay | Admitting: *Deleted

## 2022-10-12 ENCOUNTER — Encounter: Payer: Self-pay | Admitting: Cardiology

## 2022-10-12 ENCOUNTER — Ambulatory Visit: Payer: Medicare HMO | Attending: Cardiology | Admitting: Cardiology

## 2022-10-12 VITALS — BP 102/72 | HR 75 | Ht 70.0 in | Wt 125.0 lb

## 2022-10-12 DIAGNOSIS — I5022 Chronic systolic (congestive) heart failure: Secondary | ICD-10-CM | POA: Diagnosis not present

## 2022-10-12 DIAGNOSIS — I25119 Atherosclerotic heart disease of native coronary artery with unspecified angina pectoris: Secondary | ICD-10-CM

## 2022-10-12 DIAGNOSIS — E782 Mixed hyperlipidemia: Secondary | ICD-10-CM

## 2022-10-12 NOTE — Progress Notes (Signed)
Cardiology Office Note  Date: 10/12/2022   ID: [name] Graham, DOB [birth year], MRN [id number]  History of Present Illness: [name] Graham is an 87 y.o. male last seen in December 2023.  He is here for a routine visit.  From a cardiac perspective, he does not describe any progressive angina on medical therapy.  NYHA class II-III dyspnea as before.  No palpitations or syncope.  He was seen recently by Dr. Ellin Saba, I reviewed the note.  He has a history of squamous cell carcinoma of the left lower lobe with recent CT imaging suggesting local recurrence, not felt to be candidate for surgery or radiation.  Chemoimmunotherapy is being considered (Nivolumab, Ipilimumab, Carboplatin, Paclitaxel)..  He brought in paperwork today about this, has a lot of concerns about proceeding with treatment.  He recalls his sister undergoing chemotherapy and not doing well with it.  He is worried about side effects and how he will feel in the time that he has.  He is considering not pursuing treatment and I asked him to discuss this again with Dr. Ellin Saba.  Would not be an unreasonable choice at age 51 and with his other comorbid illnesses.  Palliative care would be a reasonable choice in that case.  I reviewed his cardiac medications, he reports compliance with therapy.  Physical Exam: VS:  BP 102/72   Pulse 75   Ht 5\' 10"  (1.778 m)   Wt 125 lb (56.7 kg)   SpO2 98%   BMI 17.94 kg/m , BMI Body mass index is 17.94 kg/m.  Wt Readings from Last 3 Encounters:  10/12/22 125 lb (56.7 kg)  10/06/22 125 lb 6.4 oz (56.9 kg)  06/01/22 130 lb 8 oz (59.2 kg)    General: Patient appears comfortable at rest. HEENT: Conjunctiva and lids normal. Lungs: Clear to auscultation with decreased breath sounds, nonlabored breathing at rest. Cardiac: Regular rate and rhythm, no S3, 1/6 systolic murmur. Extremities: No pitting edema.  ECG:  An ECG dated December 2023 was personally reviewed today and demonstrated:   Sinus rhythm with right bundle branch block and left anterior fascicular block.  Labwork: September 2021: Cholesterol 122, triglycerides 78, HDL 57, LDL 49 10/21/2021: TSH 9.260 09/29/2022: ALT 11; AST 17; BUN 17; Creatinine, Ser 1.17; Hemoglobin 15.6; Platelets 260; Potassium 4.1; Sodium 135   Other Studies Reviewed Today:  No interval cardiac testing for review today.  Assessment and Plan:  1.  CAD status post BMS to the circumflex and angioplasty of the PDA in 2007, DES to the RCA in 2009, and DES to the LAD in 2012.  Ischemic testing via Lexiscan Myoview in 2020 was consistent with infarct scar and mild inferior/inferoseptal peri-infarct ischemia.  He has been managed medically.  Reports no progressive angina at this time.  Continue aspirin, Plavix, Toprol-XL, Benicar, Crestor, and as needed nitroglycerin.  2.  HFmrEF with ischemic cardiomyopathy and LVEF 40 to 45% by echocardiogram in July 2022.  GDMT limited given prior intolerance of Entresto and recurrent hyperkalemia and limiting use of MRA.  Continue Toprol-XL and Benicar.  Not requiring diuretic at this time.  3.  Essential hypertension.  Continue Norvasc in addition to the above regimen.  Blood pressure low normal today.  May need to back off on treatment particularly as he loses weight..  4.  Mixed hyperlipidemia.  Continue Crestor.  5.  Squamous cell carcinoma of the lung with evidence of progression by recent CT imaging.  As per above discussion patient very hesitant  to pursue chemoimmunotherapy.  I have asked him to reach out to Dr. Ellin Saba to discuss the situation further as it relates to potential side effects with treatment, anticipated effect on longevity with and without treatment, and also possibility of palliative care if he elects not to pursue treatment.  Disposition:  Follow up  3 months.  Signed, Jonelle Sidle, M.D., F.A.C.C. [facility]

## 2022-10-12 NOTE — Progress Notes (Signed)
Guardant 360 results received and reviewed with Dr. Ellin Saba. Dr. Ellin Saba recommends starting chemoimmunotherapy combination. Call placed to patient to discuss scheduling, unable to reach patient directly. VM left asking that the patient return my call.

## 2022-10-12 NOTE — Patient Instructions (Signed)
Medication Instructions:  Your physician recommends that you continue on your current medications as directed. Please refer to the Current Medication list given to you today.   Labwork: None  Testing/Procedures: None  Follow-Up: Your physician recommends that you schedule a follow-up appointment in: 3 months  Any Other Special Instructions Will Be Listed Below (If Applicable).     If you need a refill on your cardiac medications before your next appointment, please call your pharmacy.   

## 2022-10-14 ENCOUNTER — Inpatient Hospital Stay (HOSPITAL_BASED_OUTPATIENT_CLINIC_OR_DEPARTMENT_OTHER): Payer: Medicare HMO | Admitting: Hematology

## 2022-10-14 VITALS — BP 134/71 | HR 81 | Temp 98.3°F | Resp 20 | Ht 70.0 in | Wt 127.1 lb

## 2022-10-14 DIAGNOSIS — K219 Gastro-esophageal reflux disease without esophagitis: Secondary | ICD-10-CM | POA: Diagnosis not present

## 2022-10-14 DIAGNOSIS — Z792 Long term (current) use of antibiotics: Secondary | ICD-10-CM | POA: Diagnosis not present

## 2022-10-14 DIAGNOSIS — J432 Centrilobular emphysema: Secondary | ICD-10-CM | POA: Diagnosis not present

## 2022-10-14 DIAGNOSIS — F17210 Nicotine dependence, cigarettes, uncomplicated: Secondary | ICD-10-CM | POA: Diagnosis not present

## 2022-10-14 DIAGNOSIS — I252 Old myocardial infarction: Secondary | ICD-10-CM | POA: Diagnosis not present

## 2022-10-14 DIAGNOSIS — Z79899 Other long term (current) drug therapy: Secondary | ICD-10-CM | POA: Diagnosis not present

## 2022-10-14 DIAGNOSIS — I2510 Atherosclerotic heart disease of native coronary artery without angina pectoris: Secondary | ICD-10-CM | POA: Diagnosis not present

## 2022-10-14 DIAGNOSIS — C3492 Malignant neoplasm of unspecified part of left bronchus or lung: Secondary | ICD-10-CM | POA: Diagnosis not present

## 2022-10-14 DIAGNOSIS — Z7982 Long term (current) use of aspirin: Secondary | ICD-10-CM | POA: Diagnosis not present

## 2022-10-14 DIAGNOSIS — Z7902 Long term (current) use of antithrombotics/antiplatelets: Secondary | ICD-10-CM | POA: Diagnosis not present

## 2022-10-14 DIAGNOSIS — I10 Essential (primary) hypertension: Secondary | ICD-10-CM | POA: Diagnosis not present

## 2022-10-14 DIAGNOSIS — E785 Hyperlipidemia, unspecified: Secondary | ICD-10-CM | POA: Diagnosis not present

## 2022-10-14 DIAGNOSIS — Z79890 Hormone replacement therapy (postmenopausal): Secondary | ICD-10-CM | POA: Diagnosis not present

## 2022-10-14 DIAGNOSIS — Z7951 Long term (current) use of inhaled steroids: Secondary | ICD-10-CM | POA: Diagnosis not present

## 2022-10-14 DIAGNOSIS — I255 Ischemic cardiomyopathy: Secondary | ICD-10-CM | POA: Diagnosis not present

## 2022-10-14 DIAGNOSIS — R634 Abnormal weight loss: Secondary | ICD-10-CM | POA: Diagnosis not present

## 2022-10-14 DIAGNOSIS — Z87442 Personal history of urinary calculi: Secondary | ICD-10-CM | POA: Diagnosis not present

## 2022-10-14 DIAGNOSIS — C3432 Malignant neoplasm of lower lobe, left bronchus or lung: Secondary | ICD-10-CM | POA: Diagnosis not present

## 2022-10-14 DIAGNOSIS — J449 Chronic obstructive pulmonary disease, unspecified: Secondary | ICD-10-CM | POA: Diagnosis not present

## 2022-10-14 DIAGNOSIS — I700 Atherosclerosis of aorta: Secondary | ICD-10-CM | POA: Diagnosis not present

## 2022-10-14 NOTE — Progress Notes (Signed)
[facility] [address], [place] [address]    Clinic Day:  10/14/2022  Referring physician: Deatra Canter, FNP  Patient Care Team: Deatra Canter, FNP as PCP - General (Family Medicine) Jonelle Sidle, MD as PCP - Cardiology (Cardiology) Jena Gauss Gerrit Friends, MD as Consulting Physician (Gastroenterology) Therese Sarah, RN as Oncology Nurse Navigator (Oncology) Doreatha Massed, MD as Medical Oncologist (Medical Oncology)   ASSESSMENT & PLAN:   Assessment: 1.  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 -Patient placed on O2 nasal cannula in the last 3 weeks for drop in sats on exertion. -PET scan on 01/28/2020 shows 2.6 cm left lower lobe nodule SUV 10.1.  Focal hypermetabolic them in the left hilum identified without discrete lymph node visible on CT.  10 mm short axis precarinal lymph node with low-level SUV 2.9.  7 mm short axis right hilar node with SUV 3.8. -Navigational bronchoscopy and biopsy of the left lower lobe lung lesion consistent with squamous cell carcinoma. -IMRT to the left lower lung mass from 04/14/2020 through 04/24/2020, 50 Gy/ 5 fractions. - NGS testing on sample from 02/19/2020: Negative for BRAF V600 E.  MMR proficient.  PD-L1 22 C3, 28-8 and SP142 0%.  TRK A/B/C-. - Guardant360: Unconventional EGFR mutation -PET scan on 04/09/2021 showed left lower lobe lung mass measuring 2.1 x 2.2 cm, SUV 5.7.  Hilar hypermetabolic without well-defined adenopathy. - He was evaluated by Dr. Roselind Messier and was felt to be not a candidate for radiation or SBRT.   2.  Social/family history: -2 pack/day smoker for 68 years.  Currently smoking 1 pack/day.  Lives with wife and is independent of ADLs and IADLs. 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 -Father had throat cancer.    Plan: 1.  Clinical  T1c N0 M0 squamous cell carcinoma of left lung lower lobe: - CT chest (09/29/2022): Left lower lobe masslike consolidation gradually increasing, measuring 5 x 2.2 cm. - This is consistent with local recurrence.  Not a candidate for surgery/radiation. - Guardant360: Unconventional EGFR mutation. - As he has combination of squamous cell histology, PD-L1 nonexpression and unconventional EGFR mutation, portending a poor prognosis, I have recommended dual immunotherapy based regimen. - Previously we discussed 9 LA regimen with 2 cycles of chemoimmunotherapy with opdivo every 3 weeks and Yervoy every 6 weeks followed by immunotherapy. - He reportedly had a lot of family members who did not tolerate chemotherapy well.  He is reluctant to consider it. - We will repeat a PET scan in 3 months.  If there is any progression, options include best supportive care in the form of hospice or nonchemo based regimen with opdivo and Yervoy.      Orders Placed This Encounter  Procedures   NM PET Image Restag (PS) Skull Base To Thigh    Standing Status:   Future    Standing Expiration Date:   10/14/2023    Order Specific Question:   If indicated for the ordered procedure, I authorize the administration of a radiopharmaceutical per Radiology protocol    Answer:   Yes    Order Specific Question:   Preferred imaging location?    Answer:   [facility]    Order Specific Question:   Release to patient    Answer:   Immediate  I,Katie Daubenspeck,acting as a Neurosurgeon for Doreatha Massed, MD.,have documented all relevant documentation on the behalf of Doreatha Massed, MD,as directed by  Doreatha Massed, MD while in the presence of Doreatha Massed, MD.   I, Doreatha Massed MD, have reviewed the above documentation for accuracy and completeness, and I agree with the above.   Doreatha Massed, MD   6/13/20244:24 PM  CHIEF COMPLAINT:   Diagnosis:  LLL squamous cell lung cancer    Cancer  Staging  No matching staging information was found for the patient.   Prior Therapy: SBRT to LLL, 04/14/20 - 04/24/20   Current Therapy: Observation   HISTORY OF PRESENT ILLNESS:   Oncology History  Squamous cell lung cancer, left (HCC)  12/08/2016 Initial Diagnosis   Squamous cell lung cancer, left (HCC)   11/12/2020 Imaging   1. LEFT infrahilar lesion of similar size compared to most recent study with evolving post treatment changes and diminished basilar nodularity that was seen previously. 2. Basilar nodularity discussed above likely post inflammatory or infectious, attention on follow-up. No new or progressive findings. 3. Diminishing size of mediastinal lymph nodes and particularly the dominant node anterior to the carina. 4. Three-vessel coronary artery disease. 5. Emphysema and aortic atherosclerosis.        INTERVAL HISTORY:   Tab is a 87 y.o. male presenting to clinic today for follow up of LLL squamous cell lung cancer. He was last seen by me on 10/06/22.  Today, he states that he is doing well overall. His appetite level is at 100%. His energy level is at 80%.  PAST MEDICAL HISTORY:   Past Medical History: Past Medical History:  Diagnosis Date   Arthritis    COPD (chronic obstructive pulmonary disease) (HCC)    Coronary atherosclerosis of native coronary artery    BMS circ and PTCA PDA 2007, DES RCA 10/09, DES LAD 2/12   Essential hypertension    GERD (gastroesophageal reflux disease)    Hearing loss    Heart attack (HCC)    x 3 (latest 2009)   History of kidney stones    Hyperlipidemia    Ischemic cardiomyopathy    NSTEMI (non-ST elevated myocardial infarction) (HCC)    2007   Squamous cell lung cancer [facility])     Surgical History: Past Surgical History:  Procedure Laterality Date   ANGIOPLASTY     stent   BRONCHIAL BIOPSY  02/19/2020   Procedure: BRONCHIAL BIOPSIES;  Surgeon: Leslye Peer, MD;  Location: [facility] ENDOSCOPY;  Service: Pulmonary;;    BRONCHIAL BRUSHINGS  02/19/2020   Procedure: BRONCHIAL BRUSHINGS;  Surgeon: Leslye Peer, MD;  Location[facility] ENDOSCOPY;  Service: Pulmonary;;   BRONCHIAL NEEDLE ASPIRATION BIOPSY  02/19/2020   Procedure: BRONCHIAL NEEDLE ASPIRATION BIOPSIES;  Surgeon: Leslye Peer, MD;  Location: [facility] ENDOSCOPY;  Service: Pulmonary;;   COLONOSCOPY     FIDUCIAL MARKER PLACEMENT  02/19/2020   Procedure: FIDUCIAL MARKER PLACEMENT;  Surgeon: Leslye Peer, MD;  Location: [facility] ENDOSCOPY;  Service: Pulmonary;;   HERNIA REPAIR     LIPOMA EXCISION Right    hip/buttox   TONSILLECTOMY     as a child   VIDEO BRONCHOSCOPY WITH ENDOBRONCHIAL NAVIGATION N/A 02/19/2020   Procedure: VIDEO BRONCHOSCOPY WITH ENDOBRONCHIAL NAVIGATION;  Surgeon: Leslye Peer, MD;  Location: [facility] ENDOSCOPY;  Service: Pulmonary;  Laterality: N/A;    Social History: Social History   Socioeconomic History   Marital status: Married    Spouse name: Not on file   Number  of children: Not on file   Years of education: Not on file   Highest education level: Not on file  Occupational History   Occupation: Retired  Tobacco Use   Smoking status: Every Day    Packs/day: 0.50    Years: 63.00    Additional pack years: 0.00    Total pack years: 31.50    Types: Cigarettes    Start date: 06/[birth year]/1953   Smokeless tobacco: Never   Tobacco comments:    10 cigarettes a day [facility] 08/26/2021  Vaping Use   Vaping Use: Never used  Substance and Sexual Activity   Alcohol use: No    Alcohol/week: 0.0 standard drinks of alcohol   Drug use: No   Sexual activity: Not Currently  Other Topics Concern   Not on file  Social History Narrative   Not on file   Social Determinants of Health   Financial Resource Strain: Low Risk  (02/28/2020)   Overall Financial Resource Strain (CARDIA)    Difficulty of Paying Living Expenses: Not hard at all  Food Insecurity: No Food Insecurity (02/28/2020)   Hunger Vital Sign    Worried About Running Out of Food in the  Last Year: Never true    Ran Out of Food in the Last Year: Never true  Transportation Needs: No Transportation Needs (02/28/2020)   PRAPARE - Administrator, Civil Service (Medical): No    Lack of Transportation (Non-Medical): No  Physical Activity: Insufficiently Active (02/28/2020)   Exercise Vital Sign    Days of Exercise per Week: 7 days    Minutes of Exercise per Session: 10 min  Stress: Stress Concern Present (02/28/2020)   [employer] of Occupational Health - Occupational Stress Questionnaire    Feeling of Stress : Very much  Social Connections: Socially Isolated (02/28/2020)   Social Connection and Isolation Panel [NHANES]    Frequency of Communication with Friends and Family: More than three times a week    Frequency of Social Gatherings with Friends and Family: More than three times a week    Attends Religious Services: Never    Database administrator or Organizations: No    Attends Banker Meetings: Never    Marital Status: Divorced  Catering manager Violence: Not At Risk (02/28/2020)   Humiliation, Afraid, Rape, and Kick questionnaire    Fear of Current or Ex-Partner: No    Emotionally Abused: No    Physically Abused: No    Sexually Abused: No    Family History: Family History  Problem Relation Age of Onset   Throat cancer Father    Heart attack Mother    Alzheimer's disease Mother     Current Medications:  Current Outpatient Medications:    albuterol (VENTOLIN HFA) 108 (90 Base) MCG/ACT inhaler, Inhale into the lungs every 6 (six) hours as needed for wheezing or shortness of breath., Disp: , Rfl:    amLODipine (NORVASC) 5 MG tablet, Take 1 tablet by mouth once daily, Disp: 90 tablet, Rfl: 2   aspirin EC 81 MG tablet, Take 1 tablet by mouth daily., Disp: , Rfl:    budesonide-formoterol (SYMBICORT) 80-4.5 MCG/ACT inhaler, 2 puffs on days prior to heavy activity, Disp: 1 each, Rfl: 11   clopidogrel (PLAVIX) 75 MG tablet, Take 1  tablet by mouth once daily, Disp: 90 tablet, Rfl: 3   cyclobenzaprine (FLEXERIL) 5 MG tablet, Take 1 tablet by mouth three times daily as needed for muscle spasm, Disp: 90 tablet, Rfl:  0   famotidine (PEPCID) 20 MG tablet, TAKE 1 TABLET BY MOUTH ONCE DAILY AFTER  SUPPER, Disp: 90 tablet, Rfl: 0   HYDROcodone-acetaminophen (NORCO) 7.5-325 MG tablet, Take 1 tablet by mouth 2 (two) times daily as needed., Disp: , Rfl:    levofloxacin (LEVAQUIN) 750 MG tablet, Take 750 mg by mouth daily., Disp: , Rfl:    levothyroxine (SYNTHROID) 50 MCG tablet, TAKE 1 TABLET BY MOUTH ONCE DAILY BEFORE BREAKFAST, Disp: 90 tablet, Rfl: 0   metoprolol succinate (TOPROL XL) 25 MG 24 hr tablet, Take 1 tablet (25 mg total) by mouth daily., Disp: 90 tablet, Rfl: 3   Naphazoline HCl (CLEAR EYES OP), Place 1 drop into both eyes daily as needed (irritation / redness)., Disp: , Rfl:    nitroGLYCERIN (NITROSTAT) 0.4 MG SL tablet, DISSOLVE ONE TABLET UNDER THE TONGUE EVERY 5 MINUTES AS NEEDED FOR CHEST PAIN.  DO NOT EXCEED A TOTAL OF 3 DOSES IN 15 MINUTES, Disp: 25 tablet, Rfl: 3   olmesartan (BENICAR) 40 MG tablet, Take 1 tablet (40 mg total) by mouth daily., Disp: 90 tablet, Rfl: 1   OXYGEN, Inhale into the lungs., Disp: , Rfl:    pantoprazole (PROTONIX) 40 MG tablet, TAKE 1 TABLET BY MOUTH ONCE DAILY 30 TO 60 MINUTES BEFORE FIRST MEAL OF THE DAY, Disp: 90 tablet, Rfl: 1   rosuvastatin (CRESTOR) 20 MG tablet, Take 1 tablet (20 mg total) by mouth daily., Disp: 90 tablet, Rfl: 3   Allergies: Allergies  Allergen Reactions   Entresto [Sacubitril-Valsartan] Itching    REVIEW OF SYSTEMS:   Review of Systems  Constitutional:  Negative for chills, fatigue and fever.  HENT:   Negative for lump/mass, mouth sores, nosebleeds, sore throat and trouble swallowing.   Eyes:  Negative for eye problems.  Respiratory:  Negative for cough and shortness of breath.   Cardiovascular:  Negative for chest pain, leg swelling and palpitations.   Gastrointestinal:  Negative for abdominal pain, constipation, diarrhea, nausea and vomiting.  Genitourinary:  Negative for bladder incontinence, difficulty urinating, dysuria, frequency, hematuria and nocturia.   Musculoskeletal:  Negative for arthralgias, back pain, flank pain, myalgias and neck pain.  Skin:  Negative for itching and rash.  Neurological:  Negative for dizziness, headaches and numbness.  Hematological:  Does not bruise/bleed easily.  Psychiatric/Behavioral:  Negative for depression, sleep disturbance and suicidal ideas. The patient is not nervous/anxious.   All other systems reviewed and are negative.    VITALS:   Blood pressure 134/71, pulse 81, temperature 98.3 F (36.8 C), temperature source Oral, resp. rate 20, height 5\' 10"  (1.778 m), weight 127 lb 1.6 oz (57.7 kg), SpO2 95 %.  Wt Readings from Last 3 Encounters:  10/14/22 127 lb 1.6 oz (57.7 kg)  10/12/22 125 lb (56.7 kg)  10/06/22 125 lb 6.4 oz (56.9 kg)    Body mass index is 18.24 kg/m.  Performance status (ECOG): 1 - Symptomatic but completely ambulatory  PHYSICAL EXAM:   Physical Exam Vitals and nursing note reviewed. Exam conducted with a chaperone present.  Constitutional:      Appearance: Normal appearance.  Cardiovascular:     Rate and Rhythm: Normal rate and regular rhythm.     Pulses: Normal pulses.     Heart sounds: Normal heart sounds.  Pulmonary:     Effort: Pulmonary effort is normal.     Breath sounds: Normal breath sounds.  Abdominal:     Palpations: Abdomen is soft. There is no hepatomegaly, splenomegaly or mass.  Tenderness: There is no abdominal tenderness.  Musculoskeletal:     Right lower leg: No edema.     Left lower leg: No edema.  Lymphadenopathy:     Cervical: No cervical adenopathy.     Right cervical: No superficial, deep or posterior cervical adenopathy.    Left cervical: No superficial, deep or posterior cervical adenopathy.     Upper Body:     Right upper body:  No supraclavicular or axillary adenopathy.     Left upper body: No supraclavicular or axillary adenopathy.  Neurological:     General: No focal deficit present.     Mental Status: He is alert and oriented to person, place, and time.  Psychiatric:        Mood and Affect: Mood normal.        Behavior: Behavior normal.     LABS:      Latest Ref Rng & Units 09/29/2022    9:30 AM 05/14/2022    1:00 PM 11/23/2021    9:48 AM  CBC  WBC 4.0 - 10.5 K/uL 7.6  7.5  6.8   Hemoglobin 13.0 - 17.0 g/dL 40.9  81.1  91.4   Hematocrit 39.0 - 52.0 % 46.6  45.2  42.1   Platelets 150 - 400 K/uL 260  259  241       Latest Ref Rng & Units 09/29/2022    9:30 AM 05/14/2022    1:00 PM 11/23/2021    9:48 AM  CMP  Glucose 70 - 99 mg/dL 96  782  956   BUN 8 - 23 mg/dL 17  22  16    Creatinine 0.61 - 1.24 mg/dL 2.13  0.86  5.78   Sodium 135 - 145 mmol/L 135  134  136   Potassium 3.5 - 5.1 mmol/L 4.1  5.1  4.3   Chloride 98 - 111 mmol/L 101  100  106   CO2 22 - 32 mmol/L 22  23  25    Calcium 8.9 - 10.3 mg/dL 9.1  9.3  8.8   Total Protein 6.5 - 8.1 g/dL 7.8  7.8  7.0   Total Bilirubin 0.3 - 1.2 mg/dL 0.7  0.7  0.6   Alkaline Phos 38 - 126 U/L 72  74  55   AST 15 - 41 U/L 17  22  15    ALT 0 - 44 U/L 11  13  9       No results found for: "CEA1", "CEA" / No results found for: "CEA1", "CEA" No results found for: "PSA1" No results found for: "ION629" No results found for: "CAN125"  No results found for: "TOTALPROTELP", "ALBUMINELP", "A1GS", "A2GS", "BETS", "BETA2SER", "GAMS", "MSPIKE", "SPEI" No results found for: "TIBC", "FERRITIN", "IRONPCTSAT" No results found for: "LDH"   STUDIES:   CT Chest W Contrast  Result Date: 10/03/2022 CLINICAL DATA:  Non-small cell lung cancer; * Tracking Code: BO * EXAM: CT CHEST WITH CONTRAST TECHNIQUE: Multidetector CT imaging of the chest was performed during intravenous contrast administration. RADIATION DOSE REDUCTION: This exam was performed according to the  departmental dose-optimization program which includes automated exposure control, adjustment of the mA and/or kV according to patient size and/or use of iterative reconstruction technique. CONTRAST:  75mL OMNIPAQUE IOHEXOL 300 MG/ML  SOLN COMPARISON:  PET-CT dated May 27, 2022; chest CT dated May 14, 2018 FINDINGS: Cardiovascular: Normal heart size. No pericardial effusion. Normal caliber thoracic aorta with severe atherosclerotic disease including areas of ulcerated soft plaque. Severe three-vessel coronary artery calcifications.  Mediastinum/Nodes: Esophagus and thyroid are unremarkable. No enlarged lymph nodes seen in the chest. Lungs/Pleura: Central airways are patent. Severe centrilobular emphysema. Stable biapical pleural-parenchymal scarring. Masslike consolidation of the left lower lobe with associated fiducial marker is slightly increased in size when compared with prior exam, particularly along the anterior aspect which correlates with area of hypermetabolic activity. Measures 5.0 x 2.2 cm on series 2, image 121, previously 4.7 x 2.1 cm, when measured similar location. Additionally, there is new occlusion of the posterior left lower lobe bronchus. No pleural effusion or pneumothorax. Upper Abdomen: No acute abnormality. Musculoskeletal: No chest wall abnormality. No acute or significant osseous findings. IMPRESSION: 1. Increased size of masslike consolidation of the left lower lobe, particularly along the anterior aspect which correlates with area of hypermetabolic activity seen on prior PET-CT, findings are concerning for recurrent disease. Consider tissue sampling for further evaluation. 2. Aortic Atherosclerosis (ICD10-I70.0) and Emphysema (ICD10-J43.9). Electronically Signed   By: Allegra Lai M.D.   On: 10/03/2022 20:03

## 2022-10-14 NOTE — Patient Instructions (Signed)
[facility]  Discharge Instructions  You were seen and examined today by Dr. Ellin Saba.  Dr. Ellin Saba discussed your most recent lab work which revealed that everything looks good.  Dr. Ellin Saba will do a PET scan before your next appointment and then will discuss if you want to take treatment at that time.  Follow-up as scheduled in 3 months.    Thank you for choosing [facility] to provide your oncology and hematology care.   To afford each patient quality time with our provider, please arrive at least 15 minutes before your scheduled appointment time. You may need to reschedule your appointment if you arrive late (10 or more minutes). Arriving late affects you and other patients whose appointments are after yours.  Also, if you miss three or more appointments without notifying the office, you may be dismissed from the clinic at the provider's discretion.    Again, thank you for choosing [facility]  Our hope is that these requests will decrease the amount of time that you wait before being seen by our physicians.   If you have a lab appointment with the Cancer Center - please note that after April 8th, all labs will be drawn in the cancer center.  You do not have to check in or register with the main entrance as you have in the past but will complete your check-in at the cancer center.            _____________________________________________________________  Should you have questions after your visit to [facility], please contact our office at [phone] and follow the prompts.  Our office hours are 8:00 a.m. to 4:30 p.m. Monday - Thursday and 8:00 a.m. to 2:30 p.m. Friday.  Please note that voicemails left after 4:00 p.m. may not be returned until the following business day.  We are closed weekends and all major holidays.  You do have access to a nurse 24-7, just call the main number to the clinic  [phone] and do not press any options, hold on the line and a nurse will answer the phone.    For prescription refill requests, have your pharmacy contact our office and allow 72 hours.    Masks are no longer required in the cancer centers. If you would like for your care team to wear a mask while they are taking care of you, please let them know. You may have one support person who is at least 85 years old accompany you for your appointments.

## 2022-11-01 ENCOUNTER — Telehealth: Payer: Self-pay | Admitting: *Deleted

## 2022-11-01 MED ORDER — FAMOTIDINE 20 MG PO TABS: ORAL_TABLET | ORAL | 1 refills | Status: AC

## 2022-11-01 NOTE — Telephone Encounter (Signed)
Called and spoke w/ pt let him know that I refilled the requested med. He verbalized understanding. NFN att.

## 2022-11-01 NOTE — Telephone Encounter (Signed)
Patient would like a refill of Famotidine 20mg  sent over to [facility] pharmacy.  Patient would like a call back once prescription is sent over to pharmacy. Please call [phone]

## 2022-11-05 DIAGNOSIS — J449 Chronic obstructive pulmonary disease, unspecified: Secondary | ICD-10-CM | POA: Diagnosis not present

## 2022-11-05 DIAGNOSIS — I2510 Atherosclerotic heart disease of native coronary artery without angina pectoris: Secondary | ICD-10-CM | POA: Diagnosis not present

## 2022-11-15 ENCOUNTER — Other Ambulatory Visit: Payer: Self-pay | Admitting: Internal Medicine

## 2022-11-17 ENCOUNTER — Other Ambulatory Visit: Payer: Self-pay | Admitting: *Deleted

## 2022-11-17 MED ORDER — MEGESTROL ACETATE 400 MG/10ML PO SUSP: 400.0000 mg | Freq: Two times a day (BID) | ORAL | 0 refills | Status: DC

## 2022-12-06 DIAGNOSIS — J449 Chronic obstructive pulmonary disease, unspecified: Secondary | ICD-10-CM | POA: Diagnosis not present

## 2022-12-06 DIAGNOSIS — I2510 Atherosclerotic heart disease of native coronary artery without angina pectoris: Secondary | ICD-10-CM | POA: Diagnosis not present

## 2022-12-10 ENCOUNTER — Telehealth: Payer: Self-pay | Admitting: Cardiology

## 2022-12-10 MED ORDER — METOPROLOL SUCCINATE ER 25 MG PO TB24: 25.0000 mg | ORAL_TABLET | Freq: Every day | ORAL | 3 refills | Status: AC

## 2022-12-10 NOTE — Telephone Encounter (Signed)
Refilled as requested  

## 2022-12-10 NOTE — Telephone Encounter (Signed)
*  STAT* If patient is at the pharmacy, call can be transferred to refill team.   1. Which medications need to be refilled? (please list name of each medication and dose if known)   metoprolol succinate (TOPROL XL) 25 MG 24 hr tablet   2. Would you like to learn more about the convenience, safety, & potential cost savings by using the [facility]?   3. Are you open to using the Pharmacy (Type [facility]. ).   4. Which pharmacy/location (including street and city if local pharmacy) is medication to be sent to?  [facility] [address], [place] - [address]   5. Do they need a 30 day or 90 day supply?   90 day   Patient stated he has 2 tablets left.

## 2022-12-14 ENCOUNTER — Ambulatory Visit: Payer: Medicare HMO | Admitting: Nurse Practitioner

## 2022-12-31 ENCOUNTER — Other Ambulatory Visit: Payer: Self-pay | Admitting: Cardiology

## 2023-01-06 ENCOUNTER — Ambulatory Visit (HOSPITAL_COMMUNITY)
Admission: RE | Admit: 2023-01-06 | Discharge: 2023-01-06 | Disposition: A | Discharge status: Home / Self Care | Payer: Medicare HMO | Source: Ambulatory Visit | Attending: Hematology | Admitting: Hematology

## 2023-01-06 DIAGNOSIS — I2510 Atherosclerotic heart disease of native coronary artery without angina pectoris: Secondary | ICD-10-CM | POA: Diagnosis not present

## 2023-01-06 DIAGNOSIS — J449 Chronic obstructive pulmonary disease, unspecified: Secondary | ICD-10-CM | POA: Diagnosis not present

## 2023-01-06 DIAGNOSIS — R918 Other nonspecific abnormal finding of lung field: Secondary | ICD-10-CM | POA: Diagnosis not present

## 2023-01-06 DIAGNOSIS — C3492 Malignant neoplasm of unspecified part of left bronchus or lung: Secondary | ICD-10-CM | POA: Diagnosis not present

## 2023-01-06 DIAGNOSIS — C342 Malignant neoplasm of middle lobe, bronchus or lung: Secondary | ICD-10-CM | POA: Diagnosis not present

## 2023-01-06 MED ORDER — FLUDEOXYGLUCOSE F - 18 (FDG) INJECTION
6.5700 | Freq: Once | INTRAVENOUS | Status: AC | PRN
  Administered 2023-01-06: 6.57 via INTRAVENOUS

## 2023-01-12 ENCOUNTER — Inpatient Hospital Stay: Payer: Medicare HMO | Attending: Hematology | Admitting: Hematology

## 2023-01-12 VITALS — BP 116/80 | HR 68 | Temp 98.4°F | Resp 20 | Wt 123.0 lb

## 2023-01-12 DIAGNOSIS — C3492 Malignant neoplasm of unspecified part of left bronchus or lung: Secondary | ICD-10-CM

## 2023-01-12 DIAGNOSIS — F17210 Nicotine dependence, cigarettes, uncomplicated: Secondary | ICD-10-CM | POA: Insufficient documentation

## 2023-01-12 DIAGNOSIS — C3432 Malignant neoplasm of lower lobe, left bronchus or lung: Secondary | ICD-10-CM | POA: Diagnosis not present

## 2023-01-12 NOTE — Patient Instructions (Addendum)
[facility]  Discharge Instructions  You were seen and examined today by Dr. Ellin Saba.  Dr. Ellin Saba discussed your most recent CT scan which revealed that the cancer has grown.   Dr. Ellin Saba recommends that you enroll in palliative care for pain relief.  Call our office as needed.    Thank you for choosing [facility] to provide your oncology and hematology care.   To afford each patient quality time with our provider, please arrive at least 15 minutes before your scheduled appointment time. You may need to reschedule your appointment if you arrive late (10 or more minutes). Arriving late affects you and other patients whose appointments are after yours.  Also, if you miss three or more appointments without notifying the office, you may be dismissed from the clinic at the provider's discretion.    Again, thank you for choosing [facility].  Our hope is that these requests will decrease the amount of time that you wait before being seen by our physicians.   If you have a lab appointment with the Cancer Center - please note that after April 8th, all labs will be drawn in the cancer center.  You do not have to check in or register with the main entrance as you have in the past but will complete your check-in at the cancer center.            _____________________________________________________________  Should you have questions after your visit to [facility], please contact our office at [phone] and follow the prompts.  Our office hours are 8:00 a.m. to 4:30 p.m. Monday - Thursday and 8:00 a.m. to 2:30 p.m. Friday.  Please note that voicemails left after 4:00 p.m. may not be returned until the following business day.  We are closed weekends and all major holidays.  You do have access to a nurse 24-7, just call the main number to the clinic [phone] and do not press any options, hold on the line  and a nurse will answer the phone.    For prescription refill requests, have your pharmacy contact our office and allow 72 hours.    Masks are no longer required in the cancer centers. If you would like for your care team to wear a mask while they are taking care of you, please let them know. You may have one support person who is at least 85 years old accompany you for your appointments.

## 2023-01-12 NOTE — Progress Notes (Signed)
[facility] [address], [place] [address]    Clinic Day:  01/12/2023  Referring physician: Deatra Canter, FNP  Patient Care Team: Deatra Canter, FNP as PCP - General (Family Medicine) Jonelle Sidle, MD as PCP - Cardiology (Cardiology) Jena Gauss Gerrit Friends, MD as Consulting Physician (Gastroenterology) Therese Sarah, RN as Oncology Nurse Navigator (Oncology) Doreatha Massed, MD as Medical Oncologist (Medical Oncology)   ASSESSMENT & PLAN:   Assessment: 1.  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 -Patient placed on O2 nasal cannula in the last 3 weeks for drop in sats on exertion. -PET scan on 01/28/2020 shows 2.6 cm left lower lobe nodule SUV 10.1.  Focal hypermetabolic them in the left hilum identified without discrete lymph node visible on CT.  10 mm short axis precarinal lymph node with low-level SUV 2.9.  7 mm short axis right hilar node with SUV 3.8. -Navigational bronchoscopy and biopsy of the left lower lobe lung lesion consistent with squamous cell carcinoma. -IMRT to the left lower lung mass from 04/14/2020 through 04/24/2020, 50 Gy/ 5 fractions. - NGS testing on sample from 02/19/2020: Negative for BRAF V600 E.  MMR proficient.  PD-L1 22 C3, 28-8 and SP142 0%.  TRK A/B/C-. - Guardant360: Unconventional EGFR mutation -PET scan on 04/09/2021 showed left lower lobe lung mass measuring 2.1 x 2.2 cm, SUV 5.7.  Hilar hypermetabolic without well-defined adenopathy. - He was evaluated by Dr. Roselind Messier and was felt to be not a candidate for radiation or SBRT.   2.  Social/family history: -2 pack/day smoker for 68 years.  Currently smoking 1 pack/day.  Lives with wife and is independent of ADLs and IADLs. 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 -Father had throat cancer.    Plan: 1.  Clinical  T1c N0 M0 squamous cell carcinoma of left lung lower lobe: - Reviewed PET scan from 01/06/2023: Left mid lung perihilar mass has increased in size and FDG activity.  Progressive lateral extension of the tumor is identified within the left lower lobe.  No distant metastatic disease. - We discussed treatment options with chemoimmunotherapy or immunotherapy by itself.  We discussed side effects in detail. - He is not interested in any active therapy at this time. - We also discussed best supportive care in the form of hospice.  He is able to do his day-to-day activities and is able to get around pretty good. - We will make a referral to palliative care.  If his condition deteriorates, he will be transition to hospice. - Will see him on an as-needed basis.      No orders of the defined types were placed in this encounter.     I,Katie Daubenspeck,acting as a Neurosurgeon for Doreatha Massed, MD.,have documented all relevant documentation on the behalf of Doreatha Massed, MD,as directed by  Doreatha Massed, MD while in the presence of Doreatha Massed, MD.   I, Doreatha Massed MD, have reviewed the above documentation for accuracy and completeness, and I agree with the above.   Doreatha Massed, MD   9/11/20242:06 PM  CHIEF COMPLAINT:   Diagnosis:  LLL squamous cell lung cancer    Cancer Staging  No matching staging information was found for the patient.    Prior Therapy: SBRT to LLL, 04/14/20 - 04/24/20   Current Therapy: Observation  HISTORY OF PRESENT ILLNESS:   Oncology History  Squamous cell lung cancer, left (HCC)  12/08/2016 Initial Diagnosis   Squamous cell lung cancer, left (HCC)   11/12/2020 Imaging   1. LEFT infrahilar lesion of similar size compared to most recent study with evolving post treatment changes and diminished basilar nodularity that was seen previously. 2. Basilar nodularity discussed above likely post inflammatory or infectious, attention  on follow-up. No new or progressive findings. 3. Diminishing size of mediastinal lymph nodes and particularly the dominant node anterior to the carina. 4. Three-vessel coronary artery disease. 5. Emphysema and aortic atherosclerosis.        INTERVAL HISTORY:   [name] Graham is a 87 y.o. male seen for follow-up of left lower lobe squamous cell lung cancer. He reports update levels of 70%.  Energy levels are low at 10%.  Reports chronic pain in the legs, back and shoulders.  PAST MEDICAL HISTORY:   Past Medical History: Past Medical History:  Diagnosis Date   Arthritis    COPD (chronic obstructive pulmonary disease) (HCC)    Coronary atherosclerosis of native coronary artery    BMS circ and PTCA PDA 2007, DES RCA 10/09, DES LAD 2/12   Essential hypertension    GERD (gastroesophageal reflux disease)    Hearing loss    Heart attack (HCC)    x 3 (latest 2009)   History of kidney stones    Hyperlipidemia    Ischemic cardiomyopathy    NSTEMI (non-ST elevated myocardial infarction) (HCC)    2007   Squamous cell lung cancer [facility])     Surgical History: Past Surgical History:  Procedure Laterality Date   ANGIOPLASTY     stent   BRONCHIAL BIOPSY  02/19/2020   Procedure: BRONCHIAL BIOPSIES;  Surgeon: Leslye Peer, MD;  Location: [facility] ENDOSCOPY;  Service: Pulmonary;;   BRONCHIAL BRUSHINGS  02/19/2020   Procedure: BRONCHIAL BRUSHINGS;  Surgeon: Leslye Peer, MD;  Location: [facility] ENDOSCOPY;  Service: Pulmonary;;   BRONCHIAL NEEDLE ASPIRATION BIOPSY  02/19/2020   Procedure: BRONCHIAL NEEDLE ASPIRATION BIOPSIES;  Surgeon: Leslye Peer, MD;  Location: [facility] ENDOSCOPY;  Service: Pulmonary;;   COLONOSCOPY     FIDUCIAL MARKER PLACEMENT  02/19/2020   Procedure: FIDUCIAL MARKER PLACEMENT;  Surgeon: Leslye Peer, MD;  Location: [facility] ENDOSCOPY;  Service: Pulmonary;;   HERNIA REPAIR     LIPOMA EXCISION Right    hip/buttox   TONSILLECTOMY     as a child   VIDEO BRONCHOSCOPY WITH ENDOBRONCHIAL  NAVIGATION N/A 02/19/2020   Procedure: VIDEO BRONCHOSCOPY WITH ENDOBRONCHIAL NAVIGATION;  Surgeon: Leslye Peer, MD;  Location: [facility] ENDOSCOPY;  Service: Pulmonary;  Laterality: N/A;    Social History: Social History   Socioeconomic History   Marital status: Divorced    Spouse name: Not on file   Number of children: Not on file   Years of education: Not on file   Highest education level: Not on file  Occupational History   Occupation: Retired  Tobacco Use   Smoking status: Every Day    Current packs/day: 0.50    Average packs/day: 0.5 packs/day for 69.6 years (34.8 ttl pk-yrs)    Types: Cigarettes    Start date: 06/15/1951   Smokeless tobacco: Never   Tobacco comments:    10 cigarettes a day [facility] 08/26/2021  Vaping Use   Vaping status: Never Used  Substance and Sexual Activity   Alcohol use: No    Alcohol/week: 0.0 standard drinks of alcohol   Drug use:  No   Sexual activity: Not Currently  Other Topics Concern   Not on file  Social History Narrative   Not on file   Social Determinants of Health   Financial Resource Strain: Low Risk  (02/28/2020)   Overall Financial Resource Strain (CARDIA)    Difficulty of Paying Living Expenses: Not hard at all  Food Insecurity: No Food Insecurity (05/27/2021)   Received from [facility], [facility]   Hunger Vital Sign    Worried About Running Out of Food in the Last Year: Never true    Ran Out of Food in the Last Year: Never true  Transportation Needs: No Transportation Needs (02/28/2020)   PRAPARE - Administrator, Civil Service (Medical): No    Lack of Transportation (Non-Medical): No  Physical Activity: Insufficiently Active (02/28/2020)   Exercise Vital Sign    Days of Exercise per Week: 7 days    Minutes of Exercise per Session: 10 min  Stress: Stress Concern Present (02/28/2020)   [employer] of Occupational Health - Occupational Stress Questionnaire    Feeling of Stress : Very much  Social  Connections: Unknown (08/30/2021)   Received from [facility], [facility]   Social Network    Social Network: Not on file  Intimate Partner Violence: Unknown (08/06/2021)   Received from [facility], [facility]   HITS    Physically Hurt: Not on file    Insult or Talk Down To: Not on file    Threaten Physical Harm: Not on file    Scream or Curse: Not on file    Family History: Family History  Problem Relation Age of Onset   Throat cancer Father    Heart attack Mother    Alzheimer's disease Mother     Current Medications:  Current Outpatient Medications:    albuterol (VENTOLIN HFA) 108 (90 Base) MCG/ACT inhaler, Inhale into the lungs every 6 (six) hours as needed for wheezing or shortness of breath., Disp: , Rfl:    amLODipine (NORVASC) 5 MG tablet, Take 1 tablet by mouth once daily, Disp: 90 tablet, Rfl: 2   aspirin EC 81 MG tablet, Take 1 tablet by mouth daily., Disp: , Rfl:    budesonide-formoterol (SYMBICORT) 80-4.5 MCG/ACT inhaler, 2 puffs on days prior to heavy activity, Disp: 1 each, Rfl: 11   clopidogrel (PLAVIX) 75 MG tablet, Take 1 tablet by mouth once daily, Disp: 90 tablet, Rfl: 3   cyclobenzaprine (FLEXERIL) 5 MG tablet, Take 1 tablet by mouth three times daily as needed for muscle spasm, Disp: 90 tablet, Rfl: 0   famotidine (PEPCID) 20 MG tablet, TAKE 1 TABLET BY MOUTH ONCE DAILY AFTER  SUPPER, Disp: 90 tablet, Rfl: 1   HYDROcodone-acetaminophen (NORCO) 7.5-325 MG tablet, Take 1 tablet by mouth 2 (two) times daily as needed., Disp: , Rfl:    levofloxacin (LEVAQUIN) 750 MG tablet, Take 750 mg by mouth daily., Disp: , Rfl:    levothyroxine (SYNTHROID) 50 MCG tablet, TAKE 1 TABLET BY MOUTH ONCE DAILY BEFORE BREAKFAST, Disp: 90 tablet, Rfl: 0   megestrol (MEGACE) 400 MG/10ML suspension, Take 10 mLs (400 mg total) by mouth 2 (two) times daily., Disp: 240 mL, Rfl: 0   metoprolol succinate (TOPROL XL) 25 MG 24 hr tablet, Take 1 tablet (25 mg total) by mouth daily.,  Disp: 90 tablet, Rfl: 3   Naphazoline HCl (CLEAR EYES OP), Place 1 drop into both eyes daily as needed (irritation / redness)., Disp: , Rfl:  nitroGLYCERIN (NITROSTAT) 0.4 MG SL tablet, DISSOLVE ONE TABLET UNDER THE TONGUE EVERY 5 MINUTES AS NEEDED FOR CHEST PAIN.  DO NOT EXCEED A TOTAL OF 3 DOSES IN 15 MINUTES, Disp: 25 tablet, Rfl: 3   olmesartan (BENICAR) 40 MG tablet, Take 1 tablet by mouth once daily, Disp: 90 tablet, Rfl: 1   OXYGEN, Inhale into the lungs., Disp: , Rfl:    pantoprazole (PROTONIX) 40 MG tablet, TAKE 1 TABLET BY MOUTH ONCE DAILY 30 TO 60 MINUTES BEFORE FIRST MEAL OF THE DAY, Disp: 90 tablet, Rfl: 1   rosuvastatin (CRESTOR) 20 MG tablet, Take 1 tablet (20 mg total) by mouth daily., Disp: 90 tablet, Rfl: 3   Allergies: Allergies  Allergen Reactions   Entresto [Sacubitril-Valsartan] Itching    REVIEW OF SYSTEMS:   Review of Systems  Constitutional:  Negative for chills, fatigue and fever.  HENT:   Negative for lump/mass, mouth sores, nosebleeds, sore throat and trouble swallowing.   Eyes:  Negative for eye problems.  Respiratory:  Positive for cough and shortness of breath.   Cardiovascular:  Negative for chest pain, leg swelling and palpitations.  Gastrointestinal:  Positive for constipation. Negative for abdominal pain, diarrhea, nausea and vomiting.  Genitourinary:  Negative for bladder incontinence, difficulty urinating, dysuria, frequency, hematuria and nocturia.   Musculoskeletal:  Negative for arthralgias, back pain, flank pain, myalgias and neck pain.  Skin:  Negative for itching and rash.  Neurological:  Positive for dizziness and headaches. Negative for numbness.  Hematological:  Does not bruise/bleed easily.  Psychiatric/Behavioral:  Positive for sleep disturbance. Negative for depression and suicidal ideas. The patient is not nervous/anxious.   All other systems reviewed and are negative.    VITALS:   Blood pressure 116/80, pulse 68, temperature 98.4  F (36.9 C), temperature source Oral, resp. rate 20, weight 123 lb (55.8 kg), SpO2 100%.  Wt Readings from Last 3 Encounters:  01/12/23 123 lb (55.8 kg)  10/14/22 127 lb 1.6 oz (57.7 kg)  10/12/22 125 lb (56.7 kg)    Body mass index is 17.65 kg/m.  Performance status (ECOG): 1 - Symptomatic but completely ambulatory  PHYSICAL EXAM:   Physical Exam Vitals and nursing note reviewed. Exam conducted with a chaperone present.  Constitutional:      Appearance: Normal appearance.  Cardiovascular:     Rate and Rhythm: Normal rate and regular rhythm.     Pulses: Normal pulses.     Heart sounds: Normal heart sounds.  Pulmonary:     Effort: Pulmonary effort is normal.     Breath sounds: Normal breath sounds.  Abdominal:     Palpations: Abdomen is soft. There is no hepatomegaly, splenomegaly or mass.     Tenderness: There is no abdominal tenderness.  Musculoskeletal:     Right lower leg: No edema.     Left lower leg: No edema.  Lymphadenopathy:     Cervical: No cervical adenopathy.     Right cervical: No superficial, deep or posterior cervical adenopathy.    Left cervical: No superficial, deep or posterior cervical adenopathy.     Upper Body:     Right upper body: No supraclavicular or axillary adenopathy.     Left upper body: No supraclavicular or axillary adenopathy.  Neurological:     General: No focal deficit present.     Mental Status: He is alert and oriented to person, place, and time.  Psychiatric:        Mood and Affect: Mood normal.  Behavior: Behavior normal.    LABS:      Latest Ref Rng & Units 09/29/2022    9:30 AM 05/14/2022    1:00 PM 11/23/2021    9:48 AM  CBC  WBC 4.0 - 10.5 K/uL 7.6  7.5  6.8   Hemoglobin 13.0 - 17.0 g/dL 45.4  09.8  11.9   Hematocrit 39.0 - 52.0 % 46.6  45.2  42.1   Platelets 150 - 400 K/uL 260  259  241       Latest Ref Rng & Units 09/29/2022    9:30 AM 05/14/2022    1:00 PM 11/23/2021    9:48 AM  CMP  Glucose 70 - 99 mg/dL 96   147  829   BUN 8 - 23 mg/dL 17  22  16    Creatinine 0.61 - 1.24 mg/dL 5.62  1.30  8.65   Sodium 135 - 145 mmol/L 135  134  136   Potassium 3.5 - 5.1 mmol/L 4.1  5.1  4.3   Chloride 98 - 111 mmol/L 101  100  106   CO2 22 - 32 mmol/L 22  23  25    Calcium 8.9 - 10.3 mg/dL 9.1  9.3  8.8   Total Protein 6.5 - 8.1 g/dL 7.8  7.8  7.0   Total Bilirubin 0.3 - 1.2 mg/dL 0.7  0.7  0.6   Alkaline Phos 38 - 126 U/L 72  74  55   AST 15 - 41 U/L 17  22  15    ALT 0 - 44 U/L 11  13  9       No results found for: "CEA1", "CEA" / No results found for: "CEA1", "CEA" No results found for: "PSA1" No results found for: "HQI696" No results found for: "CAN125"  No results found for: "TOTALPROTELP", "ALBUMINELP", "A1GS", "A2GS", "BETS", "BETA2SER", "GAMS", "MSPIKE", "SPEI" No results found for: "TIBC", "FERRITIN", "IRONPCTSAT" No results found for: "LDH"   STUDIES:   NM PET Image Restag (PS) Skull Base To Thigh  Result Date: 01/11/2023 CLINICAL DATA:  Subsequent treatment strategy for non-small cell lung cancer. EXAM: NUCLEAR MEDICINE PET SKULL BASE TO THIGH TECHNIQUE: 6.57 mCi F-18 FDG was injected intravenously. Full-ring PET imaging was performed from the skull base to thigh after the radiotracer. CT data was obtained and used for attenuation correction and anatomic localization. Fasting blood glucose: 86 mg/dl COMPARISON:  CT chest 29/52/8413 and PET-CT 05/27/2022 FINDINGS: Mediastinal blood pool activity: SUV max 1.99 Liver activity: SUV max NA NECK: No hypermetabolic lymph nodes in the neck. Incidental CT findings: None. CHEST: The left midlung perihilar mass measures 5.0 x 3.1 cm with SUV max of 10.29, image 74/203. On 09/29/2022 this measured 3.7 x 2.1 cm. On the PET-CT from 05/27/2022 this measured 2.9 x 1.8 cm and had an SUV max of 5.34. Progressive lateral extension of the tumor is identified within the left lower lobe along the posterior aspect of the oblique fissure measuring 2 cm with SUV max of  10.57, image 79/203. Previously this area measured 0.7 cm with SUV max of 5.34.No new tracer avid pulmonary nodule or mass identified bilaterally. No tracer avid mediastinal, right hilar, supraclavicular or axillary lymph nodes. Incidental CT findings: Emphysema. Aortic atherosclerosis and coronary artery calcifications. ABDOMEN/PELVIS: No abnormal hypermetabolic activity within the liver, pancreas, adrenal glands, or spleen. No hypermetabolic lymph nodes in the abdomen or pelvis. Incidental CT findings: Aortic atherosclerosis. Infrarenal abdominal aortic aneurysm measures 3.6 cm in AP dimension, image 114/203. Prostate gland  enlargement with mass effect upon the bladder. Diffuse bladder wall thickening noted. SKELETON: No focal hypermetabolic activity to suggest skeletal metastasis. Incidental CT findings: None. IMPRESSION: 1. The left midlung perihilar mass has increased in size and FDG avidity when compared with prior PET-CT from 05/27/2022. Findings are compatible with progressive disease. 2. Progressive lateral extension of the tumor is identified within the left lower lobe along the posterior aspect of the oblique fissure. 3. No signs of tracer avid nodal metastasis or distant metastatic disease. 4. Infrarenal abdominal aortic aneurysm measures 3.6 cm in AP dimension. Recommend follow-up ultrasound every 2 years. This recommendation follows ACR consensus guidelines: White Paper of the ACR Incidental Findings Committee II on Vascular Findings. J Am Coll Radiol 2013; 10:789-794. 5. Coronary artery calcifications. 6. Aortic Atherosclerosis (ICD10-I70.0) and Emphysema (ICD10-J43.9). Electronically Signed   By: Signa Kell M.D.   On: 01/11/2023 15:48

## 2023-01-24 ENCOUNTER — Telehealth: Payer: Self-pay | Admitting: *Deleted

## 2023-01-24 NOTE — Telephone Encounter (Signed)
Patient called to advise that he has been coughing up blood x 1 week, however it has become significant over the last 24 hours, with no other associated symptoms.  Has lung cancer not on treatment.  Advised that he be evaluated in the ER.  Verbalized understanding.

## 2023-01-25 ENCOUNTER — Other Ambulatory Visit: Payer: Self-pay | Admitting: Internal Medicine

## 2023-01-31 ENCOUNTER — Encounter: Payer: Self-pay | Admitting: Nurse Practitioner

## 2023-01-31 ENCOUNTER — Ambulatory Visit: Payer: Medicare HMO | Attending: Nurse Practitioner | Admitting: Nurse Practitioner

## 2023-01-31 VITALS — BP 128/72 | HR 77 | Ht 70.0 in | Wt 121.4 lb

## 2023-01-31 DIAGNOSIS — I2510 Atherosclerotic heart disease of native coronary artery without angina pectoris: Secondary | ICD-10-CM

## 2023-01-31 DIAGNOSIS — I10 Essential (primary) hypertension: Secondary | ICD-10-CM

## 2023-01-31 DIAGNOSIS — Z720 Tobacco use: Secondary | ICD-10-CM | POA: Diagnosis not present

## 2023-01-31 DIAGNOSIS — I5022 Chronic systolic (congestive) heart failure: Secondary | ICD-10-CM | POA: Diagnosis not present

## 2023-01-31 DIAGNOSIS — I255 Ischemic cardiomyopathy: Secondary | ICD-10-CM

## 2023-01-31 DIAGNOSIS — R0609 Other forms of dyspnea: Secondary | ICD-10-CM | POA: Diagnosis not present

## 2023-01-31 DIAGNOSIS — C3492 Malignant neoplasm of unspecified part of left bronchus or lung: Secondary | ICD-10-CM | POA: Diagnosis not present

## 2023-01-31 DIAGNOSIS — R001 Bradycardia, unspecified: Secondary | ICD-10-CM | POA: Diagnosis not present

## 2023-01-31 DIAGNOSIS — E782 Mixed hyperlipidemia: Secondary | ICD-10-CM

## 2023-01-31 DIAGNOSIS — R0602 Shortness of breath: Secondary | ICD-10-CM

## 2023-01-31 NOTE — Progress Notes (Unsigned)
Cardiology Office Note:  .   Date:  01/31/2023 ID:  Lorn Junes, DOB 1936/03/26, MRN [id number] PCP: Deatra Canter, FNP  [facility] Providers Cardiologist:  Nona Dell, MD    History of Present Illness: .   KAREM CHADICK is a 87 y.o. male with a PMH of CAD, hypertension, mixed hyperlipidemia, HFmrEF/ICM, squamous cell carcinoma of the lung, COPD, who presents today for 72-month follow-up appointment.  Previous cardiovascular history of bare-metal stent to the circumflex and angioplasty of PDA in 2007.  Drug-eluting stent to the RCA in 2009, and drug-eluting stent to the LAD in 2012.  NST in 2020 -see results below.  TTE in July 2022 revealed EF 40 to 45%, GDMT limited due to prior intolerance of Entresto and recurrent hyperkalemia therefore limiting use of MRA..   Last seen by Dr. Diona Browner on October 12, 2022.  Patient denied any progressive angina on medical therapy.  He was recently seen by Dr. Ellin Saba.  Recent CT imaging suggested local recurrence, not felt to be candidate for radiation/surgery.  Chemoimmunotherapy was being considered.  Patient expressed lots of concerns regarding proceeding with treatment, expressed concerns regarding side effects was considering not pursuing treatment.  Dr. Diona Browner asked him to discuss this again with Dr. Ellin Saba.  Today he presents for 63-month follow-up visit.  He states he does not want to be followed by Oncology, refuses Chemo.  Expresses to me that his dad received chemotherapy and died.  Does endorse shortness of breath with exertion while going from his bedroom to his bathroom, feels close to passing out, has not seen Dr. Sherene Sires in a while. Denies any chest pain, palpitations, syncope, presyncope, dizziness, orthopnea, PND, swelling or significant weight changes, acute bleeding, or claudication.  ROS: Negative.  See HPI.  Studies Reviewed: Marland Kitchen    EKG:  EKG Interpretation Date/Time:  Monday January 31 2023 13:38:52  EDT Ventricular Rate:  77 PR Interval:  160 QRS Duration:  130 QT Interval:  406 QTC Calculation: 459 R Axis:   -73  Text Interpretation: Sinus rhythm with Premature atrial complexes Right bundle branch block Left anterior fascicular block Bifascicular block When compared with ECG of 09-Oct-2018 22:18, PREVIOUS ECG IS PRESENT Confirmed by Sharlene Dory [phone]) on 01/31/2023 1:42:40 PM   Echo 10/2020:   1. Left ventricular ejection fraction, by estimation, is 40 to 45%. The  left ventricle has mildly decreased function. The left ventricle  demonstrates regional wall motion abnormalities (see scoring  diagram/findings for description). There is moderate  left ventricular hypertrophy. Left ventricular diastolic parameters are  consistent with Grade I diastolic dysfunction (impaired relaxation).   2. Right ventricular systolic function is normal. The right ventricular  size is normal. Tricuspid regurgitation signal is inadequate for assessing  PA pressure.   3. The mitral valve is grossly normal. Mild mitral valve regurgitation.   4. The aortic valve is tricuspid. Aortic valve regurgitation is not  visualized. Mild aortic valve sclerosis is present, with no evidence of  aortic valve stenosis.   5. Aortic dilatation noted. There is mild dilatation of the aortic root,  measuring 40 mm.   6. The inferior vena cava is normal in size with greater than 50%  respiratory variability, suggesting right atrial pressure of 3 mmHg.  Cardiac monitor 08/2020: ZIO XT reviewed. 14 days analyzed. Predominant rhythm is sinus with heart rate ranging from 52 bpm up to 132 bpm and average heart rate 68 bpm. There were rare PACs including couplets and triplets  representing less than 1% total beats. Relatively frequent PVCs were noted representing 5.2% total beats with rare couplets and triplets as well as ventricular bigeminy and trigeminy. There were 5 brief episodes of NSVT, no longer than 5 beats observed. No  sustained ventricular events. Several episodes of PSVT were noted, the longest of which lasted approximately 15 seconds with average heart rate of 112. There were no pauses.  Lexiscan 05/2018: Defect 1: There is a large defect of moderate severity present in the mid anteroseptal, mid inferoseptal, mid inferior, apical septal and apical inferior location. Findings consistent with prior myocardial infarction with a mild degree of inferior/inferoseptal peri-infarct ischemia. This is a high risk study primarily due to the combination of severe LV dysfunction and degree of myocardial scar/peri-infarct ischemia. Nuclear stress EF: 35%. LAFB and nonspecific IVCD seen throughout study with occasional PAC's and PVC's with stress.  Physical Exam:   VS:  BP 128/72   Pulse 77   Ht 5\' 10"  (1.778 m)   Wt 121 lb 6.4 oz (55.1 kg)   SpO2 98%   BMI 17.42 kg/m    Wt Readings from Last 3 Encounters:  01/31/23 121 lb 6.4 oz (55.1 kg)  01/12/23 123 lb (55.8 kg)  10/14/22 127 lb 1.6 oz (57.7 kg)    Heart rate read initially falsely low at 44 bpm with vital signs upon arrival, EKG reveals sinus rhythm with PACs, or pseudo bradycardia noted with vital signs.  GEN: Thin, frail 87 year old male in no acute distress NECK: No JVD; No carotid bruits CARDIAC: S1/S2, RRR, no murmurs, rubs, gallops RESPIRATORY:  Clear to auscultation without rales, wheezing or rhonchi  ABDOMEN: Soft, non-tender, non-distended EXTREMITIES:  No edema; No deformity   ASSESSMENT AND PLAN: .    HFmrEF, ICM Stage C, NYHA class II-III symptoms. EF 40 to 45% in 2022.  Admits to feeling winded while walking from bedroom to bathroom, feels as though he may pass out, possibly due to hypoxia-see below.  Will update echocardiogram at this time. Euvolemic and well compensated on exam.  No medication changes at this time. Low sodium diet, fluid restriction <2L, and daily weights encouraged. Educated to contact our office for weight gain of 2 lbs  overnight or 5 lbs in one week. Care and ED precautions discussed.   CAD, DOE Stable with no anginal symptoms. No indication for ischemic evaluation.  His dyspnea on exertion appears related to history of lung cancer and tobacco abuse, as well as history of COPD.  No indication for ischemic evaluation at this time.  Continue current medication regimen.  Will refer to pulmonology as mentioned below.  Care and ED precautions discussed.  Hypertension Blood pressure stable. Discussed to monitor BP at home at least 2 hours after medications and sitting for 5-10 minutes.  Continue current medication regimen. Heart healthy diet encouraged.   Mixed hyperlipidemia No recent lipid panel on file.  Continue current medication regimen.  Will request most recent labs from PCP's office.  Heart healthy diet encouraged.  Lung cancer, shortness of breath Patient declines chemotherapy treatment.  Discussed/reviewed goals of care.  Placing referral to Pulmonology based on his symptoms as mentioned above.  Discussed palliative care, and patient states he will let us know if he needs this in the future.  Tobacco abuse Smoking cessation encouraged and discussed.  Dispo: Follow-up with me/APP in 3 months or sooner if anything changes.  Signed, Sharlene Dory, NP

## 2023-01-31 NOTE — Patient Instructions (Addendum)
Medication Instructions:  Your physician recommends that you continue on your current medications as directed. Please refer to the Current Medication list given to you today.  Labwork: None  Testing/Procedures: Your physician has requested that you have an echocardiogram. Echocardiography is a painless test that uses sound waves to create images of your heart. It provides your doctor with information about the size and shape of your heart and how well your heart's chambers and valves are working. This procedure takes approximately one hour. There are no restrictions for this procedure. Please do NOT wear cologne, perfume, aftershave, or lotions (deodorant is allowed). Please arrive 15 minutes prior to your appointment time.  Follow-Up: Your physician recommends that you schedule a follow-up appointment in: 3 Months   Any Other Special Instructions Will Be Listed Below (If Applicable).  If you need a refill on your cardiac medications before your next appointment, please call your pharmacy.

## 2023-02-01 ENCOUNTER — Encounter: Payer: Self-pay | Admitting: Nurse Practitioner

## 2023-02-02 ENCOUNTER — Other Ambulatory Visit: Payer: Self-pay | Admitting: Hematology

## 2023-02-02 ENCOUNTER — Other Ambulatory Visit: Payer: Self-pay | Admitting: Internal Medicine

## 2023-02-05 DIAGNOSIS — J449 Chronic obstructive pulmonary disease, unspecified: Secondary | ICD-10-CM | POA: Diagnosis not present

## 2023-02-05 DIAGNOSIS — I2510 Atherosclerotic heart disease of native coronary artery without angina pectoris: Secondary | ICD-10-CM | POA: Diagnosis not present

## 2023-02-10 ENCOUNTER — Ambulatory Visit: Payer: Medicare HMO | Attending: Nurse Practitioner

## 2023-02-10 DIAGNOSIS — H353123 Nonexudative age-related macular degeneration, left eye, advanced atrophic without subfoveal involvement: Secondary | ICD-10-CM | POA: Diagnosis not present

## 2023-02-10 DIAGNOSIS — H5203 Hypermetropia, bilateral: Secondary | ICD-10-CM | POA: Diagnosis not present

## 2023-02-10 DIAGNOSIS — R0602 Shortness of breath: Secondary | ICD-10-CM | POA: Diagnosis not present

## 2023-02-10 DIAGNOSIS — H52223 Regular astigmatism, bilateral: Secondary | ICD-10-CM | POA: Diagnosis not present

## 2023-02-10 DIAGNOSIS — R0609 Other forms of dyspnea: Secondary | ICD-10-CM | POA: Diagnosis not present

## 2023-02-10 DIAGNOSIS — H524 Presbyopia: Secondary | ICD-10-CM | POA: Diagnosis not present

## 2023-02-10 DIAGNOSIS — Z961 Presence of intraocular lens: Secondary | ICD-10-CM | POA: Diagnosis not present

## 2023-02-10 DIAGNOSIS — H353111 Nonexudative age-related macular degeneration, right eye, early dry stage: Secondary | ICD-10-CM | POA: Diagnosis not present

## 2023-02-14 DIAGNOSIS — C3492 Malignant neoplasm of unspecified part of left bronchus or lung: Secondary | ICD-10-CM | POA: Diagnosis not present

## 2023-02-14 DIAGNOSIS — R64 Cachexia: Secondary | ICD-10-CM | POA: Diagnosis not present

## 2023-02-14 DIAGNOSIS — G893 Neoplasm related pain (acute) (chronic): Secondary | ICD-10-CM | POA: Diagnosis not present

## 2023-02-14 DIAGNOSIS — Z515 Encounter for palliative care: Secondary | ICD-10-CM | POA: Diagnosis not present

## 2023-02-14 DIAGNOSIS — R530 Neoplastic (malignant) related fatigue: Secondary | ICD-10-CM | POA: Diagnosis not present

## 2023-02-14 DIAGNOSIS — I5022 Chronic systolic (congestive) heart failure: Secondary | ICD-10-CM | POA: Diagnosis not present

## 2023-02-14 LAB — ECHOCARDIOGRAM COMPLETE
AR max vel: 3.85 cm2
AV Area VTI: 3.68 cm2
AV Area mean vel: 3.24 cm2
AV Mean grad: 2 mm[Hg]
AV Peak grad: 2.6 mm[Hg]
Ao pk vel: 0.81 m/s
Area-P 1/2: 3 cm2
Calc EF: 30 %
MV VTI: 1.98 cm2
S' Lateral: 3.5 cm
Single Plane A2C EF: 20.6 %
Single Plane A4C EF: 33 %

## 2023-02-21 DIAGNOSIS — R002 Palpitations: Secondary | ICD-10-CM | POA: Diagnosis not present

## 2023-02-21 DIAGNOSIS — J90 Pleural effusion, not elsewhere classified: Secondary | ICD-10-CM | POA: Diagnosis not present

## 2023-02-21 DIAGNOSIS — J209 Acute bronchitis, unspecified: Secondary | ICD-10-CM | POA: Diagnosis not present

## 2023-02-21 DIAGNOSIS — R918 Other nonspecific abnormal finding of lung field: Secondary | ICD-10-CM | POA: Diagnosis not present

## 2023-02-21 DIAGNOSIS — J189 Pneumonia, unspecified organism: Secondary | ICD-10-CM | POA: Diagnosis not present

## 2023-02-21 DIAGNOSIS — Z20822 Contact with and (suspected) exposure to covid-19: Secondary | ICD-10-CM | POA: Diagnosis not present

## 2023-02-21 DIAGNOSIS — J439 Emphysema, unspecified: Secondary | ICD-10-CM | POA: Diagnosis not present

## 2023-02-21 DIAGNOSIS — R059 Cough, unspecified: Secondary | ICD-10-CM | POA: Diagnosis not present
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
## 2023-02-22 ENCOUNTER — Ambulatory Visit: Payer: Medicare HMO | Admitting: Internal Medicine

## 2023-02-22 ENCOUNTER — Telehealth: Payer: Self-pay | Admitting: Internal Medicine

## 2023-02-22 NOTE — Telephone Encounter (Signed)
Patients wife called to cancel appt for today due to patient not feeling well. Requesting levothyroxine be refilled until next OV: 03/21/2023 Pharmacy: [facility] pharmacy in [place]

## 2023-02-23 ENCOUNTER — Other Ambulatory Visit: Payer: Self-pay | Admitting: Internal Medicine

## 2023-02-23 NOTE — Telephone Encounter (Signed)
We do not manage patient's TSH. Per last labs MW recommended patient see PCP for follow up on TSH levels/meds.  LVM with this information.

## 2023-02-24 ENCOUNTER — Ambulatory Visit (INDEPENDENT_AMBULATORY_CARE_PROVIDER_SITE_OTHER): Payer: Medicare HMO | Admitting: Internal Medicine

## 2023-02-24 ENCOUNTER — Encounter: Payer: Self-pay | Admitting: Internal Medicine

## 2023-02-24 VITALS — BP 124/66 | HR 76 | Ht 70.0 in | Wt 122.0 lb

## 2023-02-24 DIAGNOSIS — E039 Hypothyroidism, unspecified: Secondary | ICD-10-CM | POA: Diagnosis not present

## 2023-02-24 DIAGNOSIS — J449 Chronic obstructive pulmonary disease, unspecified: Secondary | ICD-10-CM | POA: Diagnosis not present

## 2023-02-24 DIAGNOSIS — C3492 Malignant neoplasm of unspecified part of left bronchus or lung: Secondary | ICD-10-CM | POA: Diagnosis not present

## 2023-02-24 MED ORDER — LEVOTHYROXINE SODIUM 50 MCG PO TABS: 50.0000 ug | ORAL_TABLET | Freq: Every day | ORAL | 0 refills | Status: AC

## 2023-02-24 NOTE — Patient Instructions (Signed)
Please remember to go to the lab department   for your tests - we will call you with the results when they are available.    No change in medications - ok to try tumeric for cramps and discuss with your PCP   Please schedule a follow up visit in 6 months but call sooner if needed
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
## 2023-02-25 LAB — TSH: TSH: 3.22 u[IU]/mL (ref 0.450–4.500)

## 2023-02-25 NOTE — Assessment & Plan Note (Addendum)
TSH  16.2  10/02/2021      - pt requesting refill on synthroid  50 mcg daily  and has not discussed above with PCP   Lab Results  Component Value Date   TSH 3.220 02/24/2023     Adequate control on present rx, reviewed in detail with pt > no change in rx needed

## 2023-02-25 NOTE — Assessment & Plan Note (Addendum)
Active smoker/ MS phenotype with level 159 10/02/21 Spirometry 03/23/2017  FEV1 1.68 (58%)  Ratio 60 with mild curvature s rx prior   - PFT's  07/26/2017  FEV1 1.97 (73 % ) ratio 62  p 13 % improvement from saba p nothing prior to study with DLCO  50 % corrects to 59 % for alv volume. - try off zestoril 01/17/2020  - 08/26/2021   Walked on RA  x  2  lap(s) =  approx 500  ft  @ avg pace, stopped due to sob with lowest 02 sats 98% - 10/02/2021   Walked on RA  x  3   lap(s) =  approx 450  ft  @ mod fast pace, stopped due to end of study with lowest 02 sats 95%   - 10/02/2021 restart max rx for GERD/ pred x 6 d / doxy x 10  - 10/21/2021   try symbicort 80 x 2 on days with activity planned > did not do as of 04/19/2022 - 04/19/2022  After extensive coaching inhaler device,  effectiveness =    80% try symb 80  prn  - 02/24/2023  After extensive coaching inhaler device,  effectiveness =    60% baseline 30 % > continue saba prn -  02/24/2023   Walked on RA  x  3  lap(s) =  approx 450  ft  @ mod fast  pace, stopped due to end of study  with lowest 02 sats 93% and no complaints    Pt not convinced inhalers really helping so fine with me to just continue the saba prn for now but work on better technique or change to nebs advised

## 2023-02-25 NOTE — Assessment & Plan Note (Addendum)
See CT neck 11/13/16 x 4 mm RUL  - Repeat 07/26/2017   No change RUL = perifissural nodule on LN > rec repeat in 12 m > placed in reminder file as he is moderate risk from smoking  - CT 01/10/20  LLL 2.4 cm spiculated nodule - nothing in RUL  - PET 01/29/2020 >>> Markedly hypermetabolic infrahilar left lower lobe pulmonary nodule consistent with primary bronchogenic neoplasm. Focal hypermetabolism in the left hilum is concerning for metastatic disease  Rx   Left lung SBRT 50 Gy in 5 fractions from 04/14/2020 to 04/24/2020  F/u per oncology / Dr Kirtland Bouchard planned.  Each maintenance medication was reviewed in detail including emphasizing most importantly the difference between maintenance and prns and under what circumstances the prns are to be triggered using an action plan format where appropriate.  Total time for H and P, chart review, counseling, reviewing hfa device(s) , directly observing portions of ambulatory 02 saturation study/ and generating customized AVS unique to this office visit / same day charting = 22 min

## 2023-03-08 ENCOUNTER — Other Ambulatory Visit: Payer: Self-pay | Admitting: Hematology

## 2023-03-08 DIAGNOSIS — R918 Other nonspecific abnormal finding of lung field: Secondary | ICD-10-CM | POA: Diagnosis not present

## 2023-03-08 DIAGNOSIS — R059 Cough, unspecified: Secondary | ICD-10-CM | POA: Diagnosis not present

## 2023-03-08 DIAGNOSIS — J449 Chronic obstructive pulmonary disease, unspecified: Secondary | ICD-10-CM | POA: Diagnosis not present

## 2023-03-08 DIAGNOSIS — I700 Atherosclerosis of aorta: Secondary | ICD-10-CM | POA: Diagnosis not present

## 2023-03-08 DIAGNOSIS — J209 Acute bronchitis, unspecified: Secondary | ICD-10-CM | POA: Diagnosis not present

## 2023-03-08 DIAGNOSIS — C3492 Malignant neoplasm of unspecified part of left bronchus or lung: Secondary | ICD-10-CM

## 2023-03-08 DIAGNOSIS — I2510 Atherosclerotic heart disease of native coronary artery without angina pectoris: Secondary | ICD-10-CM | POA: Diagnosis not present

## 2023-03-08 NOTE — Telephone Encounter (Signed)
Patient requesting refill for megace.  Chart checked and sent to pharmacy.

## 2023-03-15 ENCOUNTER — Ambulatory Visit: Payer: Medicare HMO | Admitting: Internal Medicine

## 2023-03-21 ENCOUNTER — Ambulatory Visit: Payer: Medicare HMO | Admitting: Internal Medicine

## 2023-05-02 ENCOUNTER — Ambulatory Visit: Payer: Medicare HMO | Admitting: Nurse Practitioner

## 2023-10-02 DEATH — deceased
